# Patient Record
Sex: Female | Born: 1965 | Race: White | Hispanic: No | Marital: Married | Smoking: Former smoker
Health system: Southern US, Community
[De-identification: ages and names within clinical notes are randomized; demographics above are authoritative.]

## PROBLEM LIST (undated history)

## (undated) DIAGNOSIS — K219 Gastro-esophageal reflux disease without esophagitis: Secondary | ICD-10-CM

## (undated) DIAGNOSIS — I10 Essential (primary) hypertension: Secondary | ICD-10-CM

## (undated) HISTORY — PX: TUBAL LIGATION: SHX77

## (undated) HISTORY — DX: Essential (primary) hypertension: I10

---

## 1998-08-12 ENCOUNTER — Emergency Department (HOSPITAL_COMMUNITY): Admission: EM | Admit: 1998-08-12 | Discharge: 1998-08-12 | Payer: Self-pay | Admitting: Emergency Medicine

## 1998-08-12 ENCOUNTER — Encounter: Payer: Self-pay | Admitting: Emergency Medicine

## 1998-08-12 ENCOUNTER — Ambulatory Visit (HOSPITAL_BASED_OUTPATIENT_CLINIC_OR_DEPARTMENT_OTHER): Admission: RE | Admit: 1998-08-12 | Discharge: 1998-08-12 | Payer: Self-pay | Admitting: Oral Surgery

## 2005-07-31 ENCOUNTER — Emergency Department (HOSPITAL_COMMUNITY): Admission: EM | Admit: 2005-07-31 | Discharge: 2005-07-31 | Payer: Self-pay | Admitting: Emergency Medicine

## 2008-01-04 ENCOUNTER — Inpatient Hospital Stay (HOSPITAL_COMMUNITY): Admission: EM | Admit: 2008-01-04 | Discharge: 2008-01-06 | Payer: Self-pay | Admitting: *Deleted

## 2008-06-30 ENCOUNTER — Emergency Department (HOSPITAL_COMMUNITY): Admission: EM | Admit: 2008-06-30 | Discharge: 2008-07-01 | Payer: Self-pay | Admitting: Emergency Medicine

## 2009-01-28 ENCOUNTER — Emergency Department (HOSPITAL_COMMUNITY): Admission: EM | Admit: 2009-01-28 | Discharge: 2009-01-29 | Payer: Self-pay | Admitting: Emergency Medicine

## 2009-01-30 ENCOUNTER — Emergency Department (HOSPITAL_COMMUNITY): Admission: EM | Admit: 2009-01-30 | Discharge: 2009-01-30 | Payer: Self-pay | Admitting: Emergency Medicine

## 2009-05-29 ENCOUNTER — Emergency Department (HOSPITAL_COMMUNITY): Admission: EM | Admit: 2009-05-29 | Discharge: 2009-05-29 | Payer: Self-pay | Admitting: Emergency Medicine

## 2009-06-25 ENCOUNTER — Emergency Department (HOSPITAL_COMMUNITY): Admission: EM | Admit: 2009-06-25 | Discharge: 2009-06-25 | Payer: Self-pay | Admitting: Family Medicine

## 2009-11-15 ENCOUNTER — Emergency Department (HOSPITAL_COMMUNITY): Admission: EM | Admit: 2009-11-15 | Discharge: 2009-11-15 | Payer: Self-pay | Admitting: Emergency Medicine

## 2010-07-02 ENCOUNTER — Emergency Department (HOSPITAL_COMMUNITY): Admission: EM | Admit: 2010-07-02 | Discharge: 2010-07-02 | Payer: Self-pay | Admitting: Emergency Medicine

## 2011-03-19 ENCOUNTER — Emergency Department (HOSPITAL_COMMUNITY)
Admission: EM | Admit: 2011-03-19 | Discharge: 2011-03-19 | Disposition: A | Discharge status: Home / Self Care | Payer: Self-pay | Attending: Emergency Medicine | Admitting: Emergency Medicine

## 2011-03-19 DIAGNOSIS — I10 Essential (primary) hypertension: Secondary | ICD-10-CM | POA: Insufficient documentation

## 2011-03-19 DIAGNOSIS — R21 Rash and other nonspecific skin eruption: Secondary | ICD-10-CM | POA: Insufficient documentation

## 2011-03-26 LAB — COMPREHENSIVE METABOLIC PANEL
AST: 20 U/L (ref 0–37)
CO2: 24 mEq/L (ref 19–32)
Calcium: 9.1 mg/dL (ref 8.4–10.5)
Creatinine, Ser: 0.79 mg/dL (ref 0.4–1.2)
GFR calc Af Amer: >60 mL/min (ref >60)
GFR calc non Af Amer: >60 mL/min (ref >60)
Total Protein: 6.4 g/dL (ref 6.0–8.3)

## 2011-03-26 LAB — POCT CARDIAC MARKERS
CKMB, poc: <1 ng/mL — ABNORMAL LOW (ref 1.0–8.0)
Myoglobin, poc: 49.4 ng/mL (ref 12–200)
Troponin i, poc: <0.05 ng/mL (ref 0.00–0.09)

## 2011-03-26 LAB — DIFFERENTIAL
Eosinophils Relative: 6 % — ABNORMAL HIGH (ref 0–5)
Lymphocytes Relative: 36 % (ref 12–46)
Lymphs Abs: 2.2 10*3/uL (ref 0.7–4.0)
Monocytes Relative: 7 % (ref 3–12)
Neutrophils Relative %: 50 % (ref 43–77)

## 2011-03-26 LAB — URINALYSIS, ROUTINE W REFLEX MICROSCOPIC
Bilirubin Urine: NEGATIVE
Leukocytes, UA: NEGATIVE
Nitrite: NEGATIVE
Specific Gravity, Urine: 1.009 (ref 1.005–1.030)
Urobilinogen, UA: 0.2 mg/dL (ref 0.0–1.0)
pH: 6 (ref 5.0–8.0)

## 2011-03-26 LAB — CBC
MCHC: 33.5 g/dL (ref 30.0–36.0)
MCV: 91.1 fL (ref 78.0–100.0)
RBC: 4.57 MIL/uL (ref 3.87–5.11)
RDW: 12.7 % (ref 11.5–15.5)

## 2011-03-26 LAB — URINE MICROSCOPIC-ADD ON

## 2011-03-30 ENCOUNTER — Emergency Department (HOSPITAL_COMMUNITY)
Admission: EM | Admit: 2011-03-30 | Discharge: 2011-03-30 | Disposition: A | Discharge status: Home / Self Care | Payer: Self-pay | Attending: Emergency Medicine | Admitting: Emergency Medicine

## 2011-03-30 ENCOUNTER — Emergency Department (HOSPITAL_COMMUNITY): Payer: Self-pay

## 2011-03-30 DIAGNOSIS — R109 Unspecified abdominal pain: Secondary | ICD-10-CM | POA: Insufficient documentation

## 2011-03-30 DIAGNOSIS — E669 Obesity, unspecified: Secondary | ICD-10-CM | POA: Insufficient documentation

## 2011-03-30 DIAGNOSIS — J449 Chronic obstructive pulmonary disease, unspecified: Secondary | ICD-10-CM | POA: Insufficient documentation

## 2011-03-30 DIAGNOSIS — R112 Nausea with vomiting, unspecified: Secondary | ICD-10-CM | POA: Insufficient documentation

## 2011-03-30 DIAGNOSIS — K219 Gastro-esophageal reflux disease without esophagitis: Secondary | ICD-10-CM | POA: Insufficient documentation

## 2011-03-30 DIAGNOSIS — J4489 Other specified chronic obstructive pulmonary disease: Secondary | ICD-10-CM | POA: Insufficient documentation

## 2011-03-30 LAB — CBC
Hemoglobin: 15.7 g/dL — ABNORMAL HIGH (ref 12.0–15.0)
MCHC: 33.7 g/dL (ref 30.0–36.0)
RBC: 5.26 MIL/uL — ABNORMAL HIGH (ref 3.87–5.11)
WBC: 24.1 10*3/uL — ABNORMAL HIGH (ref 4.0–10.5)

## 2011-03-30 LAB — DIFFERENTIAL
Basophils Absolute: 0 10*3/uL (ref 0.0–0.1)
Basophils Relative: 0 % (ref 0–1)
Neutro Abs: 20.7 10*3/uL — ABNORMAL HIGH (ref 1.7–7.7)
Neutrophils Relative %: 86 % — ABNORMAL HIGH (ref 43–77)

## 2011-03-30 LAB — URINALYSIS, ROUTINE W REFLEX MICROSCOPIC
Glucose, UA: NEGATIVE mg/dL
Protein, ur: NEGATIVE mg/dL

## 2011-03-30 LAB — COMPREHENSIVE METABOLIC PANEL
ALT: 14 U/L (ref 0–35)
AST: 16 U/L (ref 0–37)
CO2: 25 mEq/L (ref 19–32)
Calcium: 9.3 mg/dL (ref 8.4–10.5)
Chloride: 101 mEq/L (ref 96–112)
GFR calc Af Amer: >60 mL/min (ref >60)
GFR calc non Af Amer: 54 mL/min — ABNORMAL LOW (ref >60)
Potassium: 4.1 mEq/L (ref 3.5–5.1)
Sodium: 137 mEq/L (ref 135–145)

## 2011-03-30 LAB — POCT I-STAT, CHEM 8
Hemoglobin: 17.7 g/dL — ABNORMAL HIGH (ref 12.0–15.0)
Potassium: 4.1 mEq/L (ref 3.5–5.1)
Sodium: 139 mEq/L (ref 135–145)
TCO2: 26 mmol/L (ref 0–100)

## 2011-03-30 LAB — URINE MICROSCOPIC-ADD ON

## 2011-03-30 MED ORDER — IOHEXOL 300 MG/ML  SOLN
130.0000 mL | Freq: Once | INTRAMUSCULAR | Status: AC | PRN
  Administered 2011-03-30: 130 mL via INTRAVENOUS

## 2011-04-03 LAB — DIFFERENTIAL
Basophils Absolute: 0 10*3/uL (ref 0.0–0.1)
Lymphocytes Relative: 7 % — ABNORMAL LOW (ref 12–46)
Neutro Abs: 8.3 10*3/uL — ABNORMAL HIGH (ref 1.7–7.7)
Neutrophils Relative %: 86 % — ABNORMAL HIGH (ref 43–77)

## 2011-04-03 LAB — URINE CULTURE

## 2011-04-03 LAB — POCT I-STAT, CHEM 8
BUN: 6 mg/dL (ref 6–23)
Chloride: 104 mEq/L (ref 96–112)
Potassium: 3.7 mEq/L (ref 3.5–5.1)
Sodium: 137 mEq/L (ref 135–145)

## 2011-04-03 LAB — URINALYSIS, ROUTINE W REFLEX MICROSCOPIC
Glucose, UA: NEGATIVE mg/dL
Nitrite: POSITIVE — AB
pH: 7 (ref 5.0–8.0)

## 2011-04-03 LAB — CBC
Hemoglobin: 12.8 g/dL (ref 12.0–15.0)
Platelets: 189 10*3/uL (ref 150–400)
RDW: 12.4 % (ref 11.5–15.5)

## 2011-04-03 LAB — URINE MICROSCOPIC-ADD ON

## 2011-05-01 NOTE — H&P (Signed)
NAMEJACQUILYN, [name] NO.:  [id number]   MEDICAL RECORD NO.:  [id number]          PATIENT TYPE:  INP   LOCATION:  1411                         FACILITY:  [facility]   PHYSICIAN:  Lucita Ferrara, MD         DATE OF BIRTH:  July 21, 1966   DATE OF ADMISSION:  01/04/2008  DATE OF DISCHARGE:                              HISTORY & PHYSICAL   The patient is a 45 year old obese Caucasian female who presents with  cough and shortness of breath that has been going on for 1 week.  It is  accompanied by some pleuritis and pleuritic-type of chest pain, clear  sputum, no fevers or chills.  The patient apparently has tried some  albuterol and steroid treatments at home without any relief.  At one  point she has been told that she might have COPD.  She is a chronic  smoker, smokes about one pack per day.  She had some associated  subjective fever.  Otherwise, review of systems negative.  She denies  any orthopnea, paroxysmal nocturnal dyspnea, lower extremity edema.  She  is morbidly-obese.  She denies daytime somnolence or snoring at night.  She denies any sinusitis, sinus pain.  Positive wheezing.  She denies  any nausea, vomiting, diarrhea.  She denies any urinary frequency,  urgency or burning.  She denies any changes in neurological function or  focal neurological signs.   PAST MEDICAL HISTORY:  Significant for being told that she has asthma.  Otherwise, no other past medical problems.   HOME MEDICATIONS:  Include albuterol metered dose inhaler two puffs as  needed.   ALLERGIES:  No known drug allergies.   PHYSICAL EXAMINATION:  The patient is a morbidly-obese female in no  acute distress.  Temperature 97, blood pressure 146/95, pulse 94,  respirations 20, pulse oximetry 97% on room air.  HEENT:  Normocephalic, atraumatic.  Sclerae anicteric.  Neck supple.  No  JVD, no carotid bruits.  PERRLA.  Extraocular muscles intact.  CARDIOVASCULAR:  S1, S2, regular rate and rhythm.  No  murmurs, rubs,  clicks.  ABDOMEN:  Soft, nontender, nondistended, positive bowel sounds.  LUNGS:  Positive bilateral expiratory wheezes.  No rhonchi or rales.  EXTREMITIES:  No clubbing, cyanosis, or edema.  NEUROLOGIC:  The patient is alert and oriented x3.  Cranial nerves II-  XII grossly intact.   LABORATORY DATA:  Urine pregnancy test is negative.  Basic metabolic  panel:  Sodium 139, potassium 2.9, chloride 106, CO2 25, glucose 123,  BUN 7, creatinine 0.75.  ABGs on room air show pH 7.411, pCO2 36.1, pO2  of 58.9, bicarb 22.7.  CBC:  White count 11.3, hemoglobin 12.8,  hematocrit 37.7, platelets 250.  Chest x-ray shows no acute infiltrate,  bilateral perihilar/peribronchial cuffing.   ASSESSMENT AND PLAN:  The patient is a 45 year old morbidly-obese female  with asthmatic bronchitis picture, rule out pneumonia or pneumonic  process, unlikely.  Will go ahead and start nebulizer treatments q.2h.  and q.4h., Solu-Medrol 80 mg IV q.6h., Zithromax for empiric treatment.  Will go ahead and discharge the  patient on tapering dose prednisone in 1-  2 days.  Smoking cessation.  Prophylaxis with Lovenox and Protonix.      Lucita Ferrara, MD  Electronically Signed     RR/MEDQ  D:  01/04/2008  T:  01/04/2008  Job:  [id number]

## 2011-09-06 LAB — DIFFERENTIAL
Lymphocytes Relative: 16
Monocytes Absolute: 0.5
Monocytes Relative: 4
Neutro Abs: 8.2 — ABNORMAL HIGH
Neutrophils Relative %: 73

## 2011-09-06 LAB — PHOSPHORUS: Phosphorus: 2.7

## 2011-09-06 LAB — CBC
HCT: 34.5 — ABNORMAL LOW
Hemoglobin: 12
Hemoglobin: 12.8
MCV: 93.6
RBC: 3.68 — ABNORMAL LOW
RBC: 4.01
WBC: 11.3 — ABNORMAL HIGH
WBC: 17 — ABNORMAL HIGH

## 2011-09-06 LAB — BASIC METABOLIC PANEL
BUN: 7
Calcium: 8.8
Chloride: 109
Creatinine, Ser: 0.75
GFR calc Af Amer: >60
GFR calc non Af Amer: >60
GFR calc non Af Amer: >60
Potassium: 3.8
Sodium: 139
Sodium: 139

## 2011-09-06 LAB — COMPREHENSIVE METABOLIC PANEL
Albumin: 3.6
Alkaline Phosphatase: 60
BUN: 7
Calcium: 8.7
Glucose, Bld: 144 — ABNORMAL HIGH
Potassium: 3.7
Sodium: 139
Total Protein: 6

## 2011-09-06 LAB — BLOOD GAS, ARTERIAL
Drawn by: 232811
O2 Saturation: 90.7
pCO2 arterial: 36.1
pO2, Arterial: 58.9 — ABNORMAL LOW

## 2011-09-06 LAB — MAGNESIUM: Magnesium: 1.8

## 2011-09-06 LAB — POCT PREGNANCY, URINE: Operator id: 173591

## 2011-09-14 LAB — POCT I-STAT, CHEM 8
BUN: 10
Calcium, Ion: 1.18
Hemoglobin: 13.3
Sodium: 139
TCO2: 22

## 2011-09-14 LAB — CBC
HCT: 37.8
Hemoglobin: 13.2
MCHC: 35
MCV: 92.3
Platelets: 241
RDW: 12.9

## 2011-09-14 LAB — DIFFERENTIAL
Basophils Absolute: 0.1
Eosinophils Absolute: 0.2
Eosinophils Relative: 3
Lymphocytes Relative: 32
Monocytes Absolute: 0.6

## 2011-12-21 ENCOUNTER — Ambulatory Visit (INDEPENDENT_AMBULATORY_CARE_PROVIDER_SITE_OTHER): Payer: Self-pay | Admitting: Internal Medicine

## 2011-12-21 ENCOUNTER — Encounter: Payer: Self-pay | Admitting: Internal Medicine

## 2011-12-21 VITALS — BP 145/86 | HR 90 | Temp 96.9°F | Ht 62.0 in | Wt 227.2 lb

## 2011-12-21 DIAGNOSIS — J4489 Other specified chronic obstructive pulmonary disease: Secondary | ICD-10-CM

## 2011-12-21 DIAGNOSIS — N921 Excessive and frequent menstruation with irregular cycle: Secondary | ICD-10-CM | POA: Insufficient documentation

## 2011-12-21 DIAGNOSIS — E669 Obesity, unspecified: Secondary | ICD-10-CM

## 2011-12-21 DIAGNOSIS — K219 Gastro-esophageal reflux disease without esophagitis: Secondary | ICD-10-CM

## 2011-12-21 DIAGNOSIS — I10 Essential (primary) hypertension: Secondary | ICD-10-CM

## 2011-12-21 DIAGNOSIS — N920 Excessive and frequent menstruation with regular cycle: Secondary | ICD-10-CM

## 2011-12-21 DIAGNOSIS — Z1211 Encounter for screening for malignant neoplasm of colon: Secondary | ICD-10-CM | POA: Insufficient documentation

## 2011-12-21 DIAGNOSIS — Z23 Encounter for immunization: Secondary | ICD-10-CM

## 2011-12-21 DIAGNOSIS — J449 Chronic obstructive pulmonary disease, unspecified: Secondary | ICD-10-CM

## 2011-12-21 DIAGNOSIS — Z0000 Encounter for general adult medical examination without abnormal findings: Secondary | ICD-10-CM

## 2011-12-21 LAB — HEMOGLOBIN A1C: Hgb A1c MFr Bld: 5.4 % (ref <5.7)

## 2011-12-21 LAB — LIPID PANEL
HDL: 36 mg/dL — ABNORMAL LOW (ref >39)
LDL Cholesterol: 120 mg/dL — ABNORMAL HIGH (ref 0–99)
Total CHOL/HDL Ratio: 6.4 Ratio

## 2011-12-21 MED ORDER — LISINOPRIL 10 MG PO TABS: 10.0000 mg | ORAL_TABLET | Freq: Every day | ORAL | Status: DC

## 2011-12-21 MED ORDER — ALBUTEROL SULFATE HFA 108 (90 BASE) MCG/ACT IN AERS: 2.0000 | INHALATION_SPRAY | Freq: Four times a day (QID) | RESPIRATORY_TRACT | Status: DC | PRN

## 2011-12-21 MED ORDER — NORGESTIMATE-ETH ESTRADIOL 0.25-35 MG-MCG PO TABS: 1.0000 | ORAL_TABLET | Freq: Every day | ORAL | Status: DC

## 2011-12-21 NOTE — Assessment & Plan Note (Signed)
This is the patient's first visit to the clinic, and we will have her see Rudell Cobb for counseling with insurance. We will draw lipid panel, A1c, and we performed a Pap smear today. The patient did have some spotting, so we hope that is a satisfactory sample. The patient also received a flu shot. We will see her back in a couple of months to followup on her menorrhagia and to go over her lab results and Pap smear.

## 2011-12-21 NOTE — Assessment & Plan Note (Addendum)
Patient describes periods lasting 7-12 days, with 2-4 of those days being very heavy with clots and going through pads and as little as 15 minutes. She describes regular cycles, she is post tubal ligation. We will put the patient on OCP (sprintec), given that she has no history of clots, no migraines, and is a nonsmoker. We will see her back in 2 months to reassess her menorrhagia and to discuss the results of her Pap smear.

## 2011-12-21 NOTE — Assessment & Plan Note (Signed)
Patient's initial blood pressure was in the 140s, but upon recheck was in the low 130s. I prescribed her normal dose of lisinopril 10 mg daily, and she can be followed at her next visit.

## 2011-12-21 NOTE — Assessment & Plan Note (Signed)
We briefly discussed weight loss, and I told her that in the future we have a dietary counselor that she could see, but this issue was not broadly addressed during this visit

## 2011-12-21 NOTE — Progress Notes (Signed)
Subjective:     Patient ID: [name], female   DOB: 05/31/1966, 46 y.o.   MRN: [id number]  HPI Patient is a very pleasant 46 year old woman had establish care. She has hypertension, was told she had COPD, and is obese. She's never had a PMD.  She has no specific complaints today, but does describe menorrhagia. She has regular cycles, with periods lasting 7-12 days, with 2 of the 4 days being very heavy with clots and going through a pad up to every 15 minutes. She's had a tubal ligation.  She has 5 children, lives with her 2 teenagers and her husband. She does not work. She used to work Education officer, [employer]. She does not use drugs or drink alcohol, she is a former smoker. She quit in 2009, and has a 30-40-pack-year history. She is admitted to the hospital in 2009 for an acute bronchitis and told she might have COPD. She has an albuterol inhaler that she uses when necessary, typically when she has a cold, but rarely other than that.  Review of Systems No chest pain, palpitations    Objective:   Physical Exam Filed Vitals:   12/21/11 1039  BP: 145/86  Pulse: 90  Temp: 96.9 F (36.1 C)   VItal signs reviewed and stable. GEN: No apparent distress.  Alert and oriented x 3.  Pleasant, conversant, and cooperative to exam. HEENT: NCAT.  Neck is supple without palpable masses or lymphadenopathy.  EOMI.  PERRLA.  Sclerae anicteric.  Conjunctivae noninjected. MMM.  Oropharynx is without erythema, exudates, or other abnormal lesions. RESP:  Lungs are clear to ascultation bilaterally with good air movement.  No wheezes, ronchi, or rubs. CARDIOVASCULAR: regular rate, normal rhythm.  Clear S1, S2, no murmurs, gallops, or rubs. ABDOMEN: soft, non-tender, non-distended.  Bowels sounds present in all quadrants and normoactive.  No palpable masses. EXT: warm and dry.  Peripheral pulses equal, intact, and +2 globally.  No clubbing or cyanosis.  No edema in b/l lower extremeties SKIN: warm and dry with normal  turgor.  No rashes or abnormal lesions observed. NEURO: CN II-XII grossly intact.  Muscle strength +5/5 in bilateral upper and lower extremities.  Sensation is grossly intact.  No focal deficit.     Assessment:        Plan:

## 2011-12-21 NOTE — Assessment & Plan Note (Signed)
Patient was told that she had COPD in 2009, no PFTs on file, does have a 30-40 pack year smoking history, with quit date in 2009. She describes occasional albuterol use, especially when sick. I think she may have asthma. If she can get plugged into the orange card or some other insurance, we could pursue outpatient PFTs to establish a baseline. If there is true concern for COPD, then she may need testing for alpha-1 antitrypsin, so this issue should be continued to get worked up as she gets plugged into our system.

## 2011-12-21 NOTE — Progress Notes (Signed)
Patient seen and examined. I performed a pap smear as patient preferred that a female MD do the procedure.   EA:[id number] appearing external genitalia. Normal appearing vaginal vault with small amount of bloody discharge coming from the cervical os. Endo- and exo-cervical pap smears obtained with the broom causing more bloody discharge. No bimanual exam done because of bleeding.  I agree with the rest of assessment and plan as per Dr. Abner Greenspan.

## 2012-05-06 ENCOUNTER — Emergency Department (HOSPITAL_COMMUNITY): Payer: Self-pay

## 2012-05-06 ENCOUNTER — Emergency Department (HOSPITAL_COMMUNITY)
Admission: EM | Admit: 2012-05-06 | Discharge: 2012-05-06 | Disposition: A | Discharge status: Home / Self Care | Payer: Self-pay | Attending: Emergency Medicine | Admitting: Emergency Medicine

## 2012-05-06 ENCOUNTER — Encounter (HOSPITAL_COMMUNITY): Payer: Self-pay

## 2012-05-06 DIAGNOSIS — Z79899 Other long term (current) drug therapy: Secondary | ICD-10-CM | POA: Insufficient documentation

## 2012-05-06 DIAGNOSIS — J449 Chronic obstructive pulmonary disease, unspecified: Secondary | ICD-10-CM | POA: Insufficient documentation

## 2012-05-06 DIAGNOSIS — J4489 Other specified chronic obstructive pulmonary disease: Secondary | ICD-10-CM | POA: Insufficient documentation

## 2012-05-06 DIAGNOSIS — I10 Essential (primary) hypertension: Secondary | ICD-10-CM | POA: Insufficient documentation

## 2012-05-06 LAB — BASIC METABOLIC PANEL
BUN: 7 mg/dL (ref 6–23)
Chloride: 102 mEq/L (ref 96–112)
Creatinine, Ser: 0.62 mg/dL (ref 0.50–1.10)
GFR calc Af Amer: >90 mL/min (ref >90)
GFR calc non Af Amer: >90 mL/min (ref >90)
Glucose, Bld: 99 mg/dL (ref 70–99)

## 2012-05-06 LAB — CBC
HCT: 38.8 % (ref 36.0–46.0)
Hemoglobin: 12.9 g/dL (ref 12.0–15.0)
MCH: 29.9 pg (ref 26.0–34.0)
MCHC: 33.2 g/dL (ref 30.0–36.0)
RBC: 4.32 MIL/uL (ref 3.87–5.11)

## 2012-05-06 LAB — DIFFERENTIAL
Basophils Relative: 1 % (ref 0–1)
Eosinophils Absolute: 1.1 10*3/uL — ABNORMAL HIGH (ref 0.0–0.7)
Lymphs Abs: 2.2 10*3/uL (ref 0.7–4.0)
Monocytes Absolute: 0.4 10*3/uL (ref 0.1–1.0)
Monocytes Relative: 5 % (ref 3–12)

## 2012-05-06 MED ORDER — MAGNESIUM SULFATE 40 MG/ML IJ SOLN
2.0000 g | Freq: Once | INTRAMUSCULAR | Status: AC
  Administered 2012-05-06: 2 g via INTRAVENOUS
  Filled 2012-05-06 (×2): qty 50

## 2012-05-06 MED ORDER — PANTOPRAZOLE SODIUM 40 MG PO TBEC: 40.0000 mg | DELAYED_RELEASE_TABLET | Freq: Every day | ORAL | Status: DC

## 2012-05-06 MED ORDER — ALBUTEROL SULFATE (5 MG/ML) 0.5% IN NEBU
5.0000 mg | INHALATION_SOLUTION | Freq: Once | RESPIRATORY_TRACT | Status: AC
  Administered 2012-05-06: 5 mg via RESPIRATORY_TRACT
  Filled 2012-05-06: qty 1

## 2012-05-06 MED ORDER — PREDNISONE 50 MG PO TABS: ORAL_TABLET | ORAL | Status: DC

## 2012-05-06 MED ORDER — PREDNISONE 20 MG PO TABS
60.0000 mg | ORAL_TABLET | Freq: Once | ORAL | Status: AC
  Administered 2012-05-06: 60 mg via ORAL
  Filled 2012-05-06: qty 3

## 2012-05-06 MED ORDER — SODIUM CHLORIDE 0.9 % IV BOLUS (SEPSIS)
1000.0000 mL | Freq: Once | INTRAVENOUS | Status: AC
  Administered 2012-05-06: 1000 mL via INTRAVENOUS

## 2012-05-06 MED ORDER — ALBUTEROL SULFATE HFA 108 (90 BASE) MCG/ACT IN AERS: 1.0000 | INHALATION_SPRAY | Freq: Four times a day (QID) | RESPIRATORY_TRACT | Status: DC | PRN

## 2012-05-06 MED ORDER — IPRATROPIUM BROMIDE 0.02 % IN SOLN
0.5000 mg | Freq: Once | RESPIRATORY_TRACT | Status: AC
  Administered 2012-05-06: 0.5 mg via RESPIRATORY_TRACT
  Filled 2012-05-06: qty 2.5

## 2012-05-06 MED ORDER — ALBUTEROL SULFATE (2.5 MG/3ML) 0.083% IN NEBU: 2.5000 mg | INHALATION_SOLUTION | Freq: Four times a day (QID) | RESPIRATORY_TRACT | Status: DC | PRN

## 2012-05-06 NOTE — ED Provider Notes (Signed)
ECG shows normal sinus rhythm with a rate of 88, no ectopy. Normal axis. Normal P wave. Normal QRS. Normal intervals. Normal ST and T waves. Impression: normal ECG. When compared with ECG of 05/30/1999 and, no significant changes are seen   Dione Booze, MD 05/06/12 [phone]

## 2012-05-06 NOTE — ED Notes (Signed)
Patient presents reporting progressive shortness of breath x 1 month without relief from albuterol treatments or inhalers.  Patient also reporting cough and low grade fever.  Denies chest pain, n/v.

## 2012-05-06 NOTE — ED Notes (Signed)
Pt c/o wheezing and sob x1 month, pt reports using her at home Ventolin inhaler, Albuterol 2.5 mg neb tx, delsym, and Nyquil w/no relief. Audible wheezing noted while talking w/pt. Pt reports last using her neb tx 0730 this am. Pt also reports dry cough and bilateral rib pain d/t coughing, unable to lay down and sleep d/t coughing and sob

## 2012-05-06 NOTE — ED Notes (Signed)
Patient transported to X-ray 

## 2012-05-06 NOTE — ED Provider Notes (Signed)
Medical screening examination/treatment/procedure(s) were performed by non-physician practitioner and as supervising physician I was immediately available for consultation/collaboration.   Paris Hohn, MD 05/06/12 1612 

## 2012-05-06 NOTE — ED Notes (Signed)
MD at bedside. 

## 2012-05-06 NOTE — Discharge Instructions (Signed)
Continue your as needed albuterol treatments and take prednisone for complete 5 days. It is VERY important to follow up closely with your primary care provider in the near future to further discuss your asthma management however return to emergency department any time for emergent changing or worsening symptoms.  Asthma, Adult Asthma is a disease of the lungs and can make it hard to breathe. Asthma cannot be cured, but medicine can help control it. Asthma may be started (triggered) by:  Pollen.   Dust.   Animal skin flakes (dander).   Molds.   Foods.   Respiratory infections (colds, flu).   Smoke.   Exercise.   Stress.   Other things that cause allergic reactions or allergies (allergens).  HOME CARE   Talk to your doctor about how to manage your attacks at home. This may include:   Using a tool called a peak flow meter.   Having medicine ready to stop the attack.   Take all medicine as told by your doctor.   Wash bed sheets and blankets every week in hot water and put them in the dryer.   Drink enough fluids to keep your pee (urine) clear or pale yellow.   Always be ready to get emergency help. Write down the phone number for your doctor. Keep it where you can easily find it.   Talk about exercise routines with your doctor.   If animal dander is causing your asthma, you may need to find a new home for your pet(s).  GET HELP RIGHT AWAY IF:   You have muscle aches.   You cough more.   You have chest pain.   You have thick spit (sputum) that changes to yellow, green, gray, or bloody.   Medicine does not stop your wheezing.   You have problems breathing.   You have a fever.   Your medicine causes:   A rash.   Itching.   Puffiness (swelling).   Breathing problems.  MAKE SURE YOU:   Understand these instructions.   Will watch your condition.   Will get help right away if you are not doing well or get worse.  Document Released: 05/21/2008 Document  Revised: 11/22/2011 Document Reviewed: 10/13/2008 [facility] Patient Information 2012 [place], [place].

## 2012-05-06 NOTE — ED Provider Notes (Signed)
History     CSN: [id number]  Arrival date & time 05/06/12  4098   First MD Initiated Contact with Patient 05/06/12 [phone]      Chief Complaint  Patient presents with  . Shortness of Breath    (Consider location/radiation/quality/duration/timing/severity/associated sxs/prior treatment) Patient is a 46 y.o. female presenting with shortness of breath.  Shortness of Breath  Associated symptoms include shortness of breath.   Patient presents to emergency department complaining of a one month history of waxing and waning shortness of breath, cough, and wheezing stating that she has history of asthma for which she takes at-home inhaler and as needed nebulized albuterol however states that she has run over her out albuterol inhaler over the last month. Patient states she had mild temporary improvement with her albuterol inhaler use but has since run out. Patient states that she has coughing but denies productive cough. Patient states that her bilateral ribs get "sore because of all the coughing." She denies fevers, chills, chest pain, abdominal pain, nausea, vomiting, lower extremity pain or swelling. Patient states that she was taking a daily allergy medication, Claritin, but stopped that after about a week of use for no particular reason. Denies aggravating factors.  Past Medical History  Diagnosis Date  . COPD (chronic obstructive pulmonary disease)   . Hypertension     Past Surgical History  Procedure Date  . Tubal ligation     Family History  Problem Relation Age of Onset  . Hypertension Mother   . Stroke Mother   . Hypertension Father   . Heart disease Father   . COPD Sister   . Heart disease Brother   . Hypertension Brother   . Cancer Maternal Aunt   . Cancer Paternal Aunt   . Cancer Maternal Grandmother   . Heart disease Maternal Grandfather   . Hypertension Maternal Grandfather   . Heart disease Paternal Grandmother   . Diabetes Paternal Grandmother   . Heart disease  Paternal Grandfather   . Hypertension Paternal Grandfather     History  Substance Use Topics  . Smoking status: Former Smoker -- 1.0 packs/day    Quit date: 01/05/2008  . Smokeless tobacco: Never Used  . Alcohol Use: No    OB History    Grav Para Term Preterm Abortions TAB SAB Ect Mult Living                  Review of Systems  Respiratory: Positive for shortness of breath.   All other systems reviewed and are negative.    Allergies  Sulfa drugs cross reactors  Home Medications   Current Outpatient Rx  Name Route Sig Dispense Refill  . ALBUTEROL SULFATE HFA 108 (90 BASE) MCG/ACT IN AERS Inhalation Inhale 2 puffs into the lungs every 6 (six) hours as needed. For SOB    . ALBUTEROL SULFATE (2.5 MG/3ML) 0.083% IN NEBU Nebulization Take 2.5 mg by nebulization every 6 (six) hours as needed.    Marland Kitchen DEXTROMETHORPHAN POLISTIREX ER 30 MG/5ML PO LQCR Oral Take 60 mg by mouth as needed. For cough    . DIPHENHYDRAMINE HCL 25 MG PO CAPS Oral Take 50-75 mg by mouth every 6 (six) hours as needed. For sleep    . DM-DOXYLAMINE-ACETAMINOPHEN 15-6.25-325 MG PO CAPS Oral Take 2 tablets by mouth. For cough    . LISINOPRIL 10 MG PO TABS Oral Take 1 tablet (10 mg total) by mouth daily. 90 tablet 2  . NORGESTIMATE-ETH ESTRADIOL 0.25-35 MG-MCG PO TABS Oral  Take 1 tablet by mouth daily. 1 Package 11    Please dispense 3 month supply if pt wants it  . OMEPRAZOLE 10 MG PO CPDR Oral Take 10 mg by mouth daily.        BP 157/99  Pulse 112  Temp(Src) 98.1 F (36.7 C) (Oral)  Resp 22  SpO2 97%  LMP 05/04/2012  Physical Exam  Nursing note and vitals reviewed. Constitutional: She is oriented to person, place, and time. She appears well-developed and well-nourished. No distress.  HENT:  Head: Normocephalic and atraumatic.  Eyes: Conjunctivae are normal.  Neck: Normal range of motion. Neck supple.  Cardiovascular: Normal rate, regular rhythm, normal heart sounds and intact distal pulses.  Exam  reveals no gallop and no friction rub.   No murmur heard. Pulmonary/Chest: Effort normal. No respiratory distress. She has wheezes. She has no rales. She exhibits no tenderness.       Good air movement bilaterally in all lung fields with faint expiratory wheezing throughout lung fields.  Abdominal: Soft. Bowel sounds are normal. She exhibits no distension and no mass. There is no tenderness. There is no rebound and no guarding.  Musculoskeletal: Normal range of motion. She exhibits no edema and no tenderness.  Neurological: She is alert and oriented to person, place, and time.  Skin: Skin is warm and dry. No rash noted. She is not diaphoretic. No erythema.  Psychiatric: She has a normal mood and affect.    ED Course  Procedures (including critical care time)  Neb albuterol/atrovent, IV fluids PO prednisone and IV magnesium  Labs Reviewed  DIFFERENTIAL - Abnormal; Notable for the following:    Eosinophils Relative 14 (*)    Eosinophils Absolute 1.1 (*)    All other components within normal limits  BASIC METABOLIC PANEL - Abnormal; Notable for the following:    Potassium 3.4 (*)    All other components within normal limits  CBC  D-DIMER, QUANTITATIVE   Dg Chest 2 View  05/06/2012  *RADIOLOGY REPORT*  Clinical Data: Cough and shortness of breath for 1 month, history smoking, asthma  CHEST - 2 VIEW  Comparison: 11/15/2009  Findings: Normal heart size, mediastinal contours, and pulmonary vascularity. Mild chronic peribronchial thickening. No acute infiltrate, pleural effusion, or pneumothorax. No acute osseous findings.  IMPRESSION: Chronic peribronchial thickening which could reflect bronchitis or asthma. No acute infiltrate.  Original Report Authenticated By: Lollie Marrow, M.D.     1. Asthma       MDM  Patient states she is feeling much improved after neb tx, fluids and magnesium with resolution of wheezing. Negative Ddimer with patient denying CP. She is followed by [facility] and is  agreeable to close follow up to further manage asthma.        [place], [place] 05/06/12 1030

## 2012-06-10 ENCOUNTER — Encounter: Payer: Self-pay | Admitting: Internal Medicine

## 2012-06-10 ENCOUNTER — Ambulatory Visit (INDEPENDENT_AMBULATORY_CARE_PROVIDER_SITE_OTHER): Payer: Self-pay | Admitting: Internal Medicine

## 2012-06-10 VITALS — BP 140/100 | HR 89 | Temp 97.2°F | Ht 62.0 in | Wt 229.0 lb

## 2012-06-10 DIAGNOSIS — I129 Hypertensive chronic kidney disease with stage 1 through stage 4 chronic kidney disease, or unspecified chronic kidney disease: Secondary | ICD-10-CM

## 2012-06-10 DIAGNOSIS — J449 Chronic obstructive pulmonary disease, unspecified: Secondary | ICD-10-CM

## 2012-06-10 DIAGNOSIS — I10 Essential (primary) hypertension: Secondary | ICD-10-CM

## 2012-06-10 DIAGNOSIS — N920 Excessive and frequent menstruation with regular cycle: Secondary | ICD-10-CM

## 2012-06-10 DIAGNOSIS — Z0000 Encounter for general adult medical examination without abnormal findings: Secondary | ICD-10-CM

## 2012-06-10 MED ORDER — LISINOPRIL 20 MG PO TABS: 20.0000 mg | ORAL_TABLET | Freq: Every day | ORAL | Status: DC

## 2012-06-10 MED ORDER — LORATADINE 10 MG PO TABS: 10.0000 mg | ORAL_TABLET | Freq: Every day | ORAL | Status: DC

## 2012-06-10 NOTE — Assessment & Plan Note (Addendum)
Patient was in the emergency room for likely asthma attack last month. Did have eosinophilia. Did respond to magnesium and nebulizers. Was not admitted. Since then has been doing fine, with using albuterol only occasionally (less than once weekly). Lungs sound clear on exam. I do think she likely has asthma as opposed to COPD, despite what she was told in 2009. She has quit smoking 5 years ago. - Continue albuterol when necessary - Consider maintenance agent if her symptoms worsen, but at this time she does not need one - Continue smoking cessation

## 2012-06-10 NOTE — Progress Notes (Signed)
Subjective:     Patient ID: [name], female   DOB: 1966-04-16, 46 y.o.   MRN: [id number]  HPI Patient is a very pleasant 46 year old woman with a history of asthma, hypertension, obesity, and menorrhagia who presents for followup.  Since starting OCPs in January, the patient reports resolution of her menorrhagia. She reports periods now of roughly 6 days with 0-1 days of heavy menstruation. She feels excellent from this standpoint.  Patient was in the emergency room in May for an asthma exacerbation. Aside from that, she uses albuterol less than once weekly and notices symptoms only on occasion.  She has been taking her lisinopril as prescribed.  Review of Systems No chest pain, no shortness of breath, no abdominal pain, no dysuria    Objective:   Physical Exam GEN: NAD.  Alert and oriented x 3.  Pleasant, conversant, and cooperative to exam. RESP:  CTAB, no w/r/r CARDIOVASCULAR: RRR, S1, S2, no m/r/g ABDOMEN: soft, NT/ND, NABS EXT: warm and dry. No edema in b/l LE     Assessment:         Plan:

## 2012-06-10 NOTE — Assessment & Plan Note (Signed)
Blood pressure still elevated slightly despite lisinopril 10 mg. BMP in ED last month was good. - Increase lisinopril to 20 mg daily

## 2012-06-10 NOTE — Assessment & Plan Note (Signed)
-   Lipids at goal off medication - Screening A1c was negative in January of 2013 - Pap smear was normal in January 2013 - No mammogram at this time, can continue to discuss with patient given that she is in the gray zone of ages 24-50

## 2012-06-10 NOTE — Assessment & Plan Note (Signed)
Menorrhagia has resolved with initiation of OCP. The patient is very pleased with his result. She does have risk factor of hypertension that is a possible contraindication to OCP. Back in January we had discussed starting the OCP with one of Dr. Sandy Salaam OB/GYN colleagues via telephone.   Nonetheless, I would like to get her referred to OB/GYN for evaluation for possible hysterectomy, which the patient is interested in. In the meantime, he think the benefits of controlling her menorrhagia still outweigh the risks, but I do not want her to be on it for an extended period of time. She had not yet submitted her paperwork for the orange card, which would facilitate referral. She is planning on submitting it seen. - Orange card - Continue OCP - GYN referral once orange card is complete

## 2012-11-16 ENCOUNTER — Encounter (HOSPITAL_COMMUNITY): Payer: Self-pay | Admitting: *Deleted

## 2012-11-16 ENCOUNTER — Emergency Department (HOSPITAL_COMMUNITY)
Admission: EM | Admit: 2012-11-16 | Discharge: 2012-11-16 | Disposition: A | Discharge status: Home / Self Care | Payer: Self-pay | Attending: Emergency Medicine | Admitting: Emergency Medicine

## 2012-11-16 DIAGNOSIS — J449 Chronic obstructive pulmonary disease, unspecified: Secondary | ICD-10-CM | POA: Insufficient documentation

## 2012-11-16 DIAGNOSIS — Z87891 Personal history of nicotine dependence: Secondary | ICD-10-CM | POA: Insufficient documentation

## 2012-11-16 DIAGNOSIS — R197 Diarrhea, unspecified: Secondary | ICD-10-CM | POA: Insufficient documentation

## 2012-11-16 DIAGNOSIS — J4489 Other specified chronic obstructive pulmonary disease: Secondary | ICD-10-CM | POA: Insufficient documentation

## 2012-11-16 DIAGNOSIS — R1110 Vomiting, unspecified: Secondary | ICD-10-CM | POA: Insufficient documentation

## 2012-11-16 DIAGNOSIS — R109 Unspecified abdominal pain: Secondary | ICD-10-CM | POA: Insufficient documentation

## 2012-11-16 DIAGNOSIS — Z79899 Other long term (current) drug therapy: Secondary | ICD-10-CM | POA: Insufficient documentation

## 2012-11-16 DIAGNOSIS — R05 Cough: Secondary | ICD-10-CM | POA: Insufficient documentation

## 2012-11-16 DIAGNOSIS — I10 Essential (primary) hypertension: Secondary | ICD-10-CM | POA: Insufficient documentation

## 2012-11-16 DIAGNOSIS — R059 Cough, unspecified: Secondary | ICD-10-CM | POA: Insufficient documentation

## 2012-11-16 LAB — URINALYSIS, ROUTINE W REFLEX MICROSCOPIC
Bilirubin Urine: NEGATIVE
Leukocytes, UA: NEGATIVE
Nitrite: NEGATIVE
Specific Gravity, Urine: 1.025 (ref 1.005–1.030)
Urobilinogen, UA: 0.2 mg/dL (ref 0.0–1.0)
pH: 5.5 (ref 5.0–8.0)

## 2012-11-16 LAB — LIPASE, BLOOD: Lipase: 17 U/L (ref 11–59)

## 2012-11-16 LAB — COMPREHENSIVE METABOLIC PANEL
Albumin: 3.8 g/dL (ref 3.5–5.2)
Alkaline Phosphatase: 59 U/L (ref 39–117)
BUN: 9 mg/dL (ref 6–23)
Calcium: 9.6 mg/dL (ref 8.4–10.5)
Creatinine, Ser: 0.74 mg/dL (ref 0.50–1.10)
GFR calc Af Amer: >90 mL/min (ref >90)
Glucose, Bld: 116 mg/dL — ABNORMAL HIGH (ref 70–99)
Potassium: 3.9 mEq/L (ref 3.5–5.1)
Total Protein: 7.6 g/dL (ref 6.0–8.3)

## 2012-11-16 LAB — CBC WITH DIFFERENTIAL/PLATELET
Basophils Relative: 0 % (ref 0–1)
Eosinophils Absolute: 0.2 10*3/uL (ref 0.0–0.7)
Eosinophils Relative: 1 % (ref 0–5)
Hemoglobin: 14.7 g/dL (ref 12.0–15.0)
Lymphs Abs: 1.7 10*3/uL (ref 0.7–4.0)
MCH: 30.8 pg (ref 26.0–34.0)
MCHC: 34.4 g/dL (ref 30.0–36.0)
MCV: 89.3 fL (ref 78.0–100.0)
Monocytes Relative: 4 % (ref 3–12)
Neutrophils Relative %: 84 % — ABNORMAL HIGH (ref 43–77)
RBC: 4.78 MIL/uL (ref 3.87–5.11)

## 2012-11-16 LAB — URINE MICROSCOPIC-ADD ON

## 2012-11-16 MED ORDER — HYDROMORPHONE HCL PF 1 MG/ML IJ SOLN
1.0000 mg | Freq: Once | INTRAMUSCULAR | Status: AC
  Administered 2012-11-16: 1 mg via INTRAVENOUS
  Filled 2012-11-16: qty 1

## 2012-11-16 MED ORDER — PROMETHAZINE HCL 25 MG PO TABS: 25.0000 mg | ORAL_TABLET | Freq: Four times a day (QID) | ORAL | Status: DC | PRN

## 2012-11-16 MED ORDER — ONDANSETRON HCL 4 MG/2ML IJ SOLN
4.0000 mg | Freq: Once | INTRAMUSCULAR | Status: AC
  Administered 2012-11-16: 4 mg via INTRAVENOUS
  Filled 2012-11-16: qty 2

## 2012-11-16 MED ORDER — SODIUM CHLORIDE 0.9 % IV SOLN
1000.0000 mL | Freq: Once | INTRAVENOUS | Status: AC
  Administered 2012-11-16: 1000 mL via INTRAVENOUS

## 2012-11-16 MED ORDER — SODIUM CHLORIDE 0.9 % IV SOLN
1000.0000 mL | INTRAVENOUS | Status: DC
  Administered 2012-11-16: 1000 mL via INTRAVENOUS

## 2012-11-16 MED ORDER — ONDANSETRON HCL 4 MG/2ML IJ SOLN: 4.0000 mg | Freq: Once | INTRAMUSCULAR | Status: DC

## 2012-11-16 NOTE — ED Notes (Signed)
Reports n/v/d since 0300 this am. Having abd cramping.

## 2012-11-16 NOTE — ED Provider Notes (Signed)
History    CSN: [id number] Arrival date & time 11/16/12  0865 First MD Initiated Contact with Patient 11/16/12 [phone]    Chief Complaint  Patient presents with  . Emesis    HPI Comments: No foreign travel.  No suspect food.    Patient is a 46 y.o. female presenting with vomiting. The history is provided by the patient.  Emesis  This is a new problem. The current episode started 6 to 12 hours ago (3:30 this morning). The problem occurs more than 10 times per day. The problem has not changed since onset.The emesis has an appearance of stomach contents. There has been no fever. Associated symptoms include abdominal pain, cough and diarrhea. Pertinent negatives include no fever and no sweats.  Neldon Newport has heartburn but generally does not have abdominal problems.  The abdominal pain is mostly around the umbilicus.  Mostly feels like it is cramping and feels like her stomach is twisting.  Past Medical History  Diagnosis Date  . COPD (chronic obstructive pulmonary disease)   . Hypertension     Past Surgical History  Procedure Date  . Tubal ligation     Family History  Problem Relation Age of Onset  . Hypertension Mother   . Stroke Mother   . Hypertension Father   . Heart disease Father   . COPD Sister   . Heart disease Brother   . Hypertension Brother   . Cancer Maternal Aunt   . Cancer Paternal Aunt   . Cancer Maternal Grandmother   . Heart disease Maternal Grandfather   . Hypertension Maternal Grandfather   . Heart disease Paternal Grandmother   . Diabetes Paternal Grandmother   . Heart disease Paternal Grandfather   . Hypertension Paternal Grandfather     History  Substance Use Topics  . Smoking status: Former Smoker -- 1.0 packs/day    Quit date: 01/05/2008  . Smokeless tobacco: Never Used  . Alcohol Use: No    OB History    Grav Para Term Preterm Abortions TAB SAB Ect Mult Living                  Review of Systems  Constitutional: Negative for fever.    Respiratory: Positive for cough.   Gastrointestinal: Positive for vomiting, abdominal pain and diarrhea.  All other systems reviewed and are negative.    Allergies  Sulfa drugs cross reactors  Home Medications   Current Outpatient Rx  Name  Route  Sig  Dispense  Refill  . ALBUTEROL SULFATE HFA 108 (90 BASE) MCG/ACT IN AERS   Inhalation   Inhale 2 puffs into the lungs every 6 (six) hours as needed. For SOB         . DIPHENHYDRAMINE HCL 25 MG PO CAPS   Oral   Take 50-75 mg by mouth at bedtime as needed. For sleep         . ESOMEPRAZOLE MAGNESIUM 40 MG PO CPDR   Oral   Take 40 mg by mouth daily before breakfast.         . LISINOPRIL 20 MG PO TABS   Oral   Take 1 tablet (20 mg total) by mouth daily.   90 tablet   2   . NORGESTIMATE-ETH ESTRADIOL 0.25-35 MG-MCG PO TABS   Oral   Take 1 tablet by mouth daily.   1 Package   11     Please dispense 3 month supply if pt wants it   . ALBUTEROL SULFATE (2.5 MG/3ML)  0.083% IN NEBU   Nebulization   Take 3 mLs (2.5 mg total) by nebulization every 6 (six) hours as needed for wheezing.   75 mL   12     BP 148/95  Pulse 95  Temp 98.4 F (36.9 C) (Oral)  Resp 20  SpO2 98%  Physical Exam  Nursing note and vitals reviewed. Constitutional: She appears well-developed and well-nourished. No distress.  HENT:  Head: Normocephalic and atraumatic.  Right Ear: External ear normal.  Left Ear: External ear normal.  Eyes: Conjunctivae normal are normal. Right eye exhibits no discharge. Left eye exhibits no discharge. No scleral icterus.  Neck: Neck supple. No tracheal deviation present.  Cardiovascular: Normal rate, regular rhythm and intact distal pulses.   Pulmonary/Chest: Effort normal and breath sounds normal. No stridor. No respiratory distress. She has no wheezes. She has no rales.  Abdominal: Soft. Bowel sounds are normal. She exhibits no distension, no ascites and no mass. There is tenderness in the periumbilical area  and suprapubic area. There is no rigidity, no rebound, no guarding and no CVA tenderness. No hernia.  Musculoskeletal: She exhibits no edema and no tenderness.  Neurological: She is alert. She has normal strength. No sensory deficit. Cranial nerve deficit:  no gross defecits noted. She exhibits normal muscle tone. She displays no seizure activity. Coordination normal.  Skin: Skin is warm and dry. No rash noted.  Psychiatric: She has a normal mood and affect.    ED Course  Procedures (including critical care time)  Labs Reviewed  CBC WITH DIFFERENTIAL - Abnormal; Notable for the following:    WBC 16.7 (*)     Neutrophils Relative 84 (*)     Neutro Abs 14.1 (*)     Lymphocytes Relative 10 (*)     All other components within normal limits  COMPREHENSIVE METABOLIC PANEL - Abnormal; Notable for the following:    Glucose, Bld 116 (*)     All other components within normal limits  URINALYSIS, ROUTINE W REFLEX MICROSCOPIC - Abnormal; Notable for the following:    APPearance CLOUDY (*)     Hgb urine dipstick TRACE (*)     Ketones, ur >80 (*)     All other components within normal limits  URINE MICROSCOPIC-ADD ON - Abnormal; Notable for the following:    Squamous Epithelial / LPF MANY (*)     All other components within normal limits  LIPASE, BLOOD   No results found.   1. Vomiting and diarrhea       MDM  Pt is feeling better after treatment.  On repeat exam she has not focal ttp and denies any pain.  Suspect this may be related to a viral illness.  Doubt appendicitis, diverticulitis, colitis, pelvic etiology.  Discussed close follow up.  Return to the ED for recurrent pain, inability to keep down fluids.        Celene Kras, MD 11/16/12 1254

## 2012-11-16 NOTE — ED Notes (Signed)
Assumed care of pt. Pt reports that "she is feeling much better." 2/10 abd pain and decreased nausea. VSS. Family at bedside.

## 2012-11-17 ENCOUNTER — Encounter (HOSPITAL_COMMUNITY): Payer: Self-pay | Admitting: *Deleted

## 2012-11-17 ENCOUNTER — Encounter: Payer: Self-pay | Admitting: Internal Medicine

## 2012-11-17 ENCOUNTER — Observation Stay (HOSPITAL_COMMUNITY)
Admission: AD | Admit: 2012-11-17 | Discharge: 2012-11-20 | Disposition: A | Discharge status: Home / Self Care | Payer: Self-pay | Source: Ambulatory Visit | Attending: Internal Medicine | Admitting: Internal Medicine

## 2012-11-17 ENCOUNTER — Ambulatory Visit (INDEPENDENT_AMBULATORY_CARE_PROVIDER_SITE_OTHER): Payer: Self-pay | Admitting: Internal Medicine

## 2012-11-17 VITALS — BP 119/84 | HR 104 | Temp 97.2°F | Ht 62.0 in | Wt 232.3 lb

## 2012-11-17 DIAGNOSIS — K625 Hemorrhage of anus and rectum: Secondary | ICD-10-CM | POA: Diagnosis present

## 2012-11-17 DIAGNOSIS — D62 Acute posthemorrhagic anemia: Secondary | ICD-10-CM | POA: Insufficient documentation

## 2012-11-17 DIAGNOSIS — K219 Gastro-esophageal reflux disease without esophagitis: Secondary | ICD-10-CM | POA: Insufficient documentation

## 2012-11-17 DIAGNOSIS — K921 Melena: Secondary | ICD-10-CM | POA: Insufficient documentation

## 2012-11-17 DIAGNOSIS — K559 Vascular disorder of intestine, unspecified: Principal | ICD-10-CM | POA: Insufficient documentation

## 2012-11-17 DIAGNOSIS — R109 Unspecified abdominal pain: Secondary | ICD-10-CM

## 2012-11-17 DIAGNOSIS — I10 Essential (primary) hypertension: Secondary | ICD-10-CM | POA: Insufficient documentation

## 2012-11-17 DIAGNOSIS — R112 Nausea with vomiting, unspecified: Secondary | ICD-10-CM | POA: Insufficient documentation

## 2012-11-17 DIAGNOSIS — J4489 Other specified chronic obstructive pulmonary disease: Secondary | ICD-10-CM | POA: Insufficient documentation

## 2012-11-17 DIAGNOSIS — J449 Chronic obstructive pulmonary disease, unspecified: Secondary | ICD-10-CM

## 2012-11-17 HISTORY — DX: Gastro-esophageal reflux disease without esophagitis: K21.9

## 2012-11-17 LAB — CBC
MCH: 29.8 pg (ref 26.0–34.0)
MCHC: 33 g/dL (ref 30.0–36.0)
MCV: 90.4 fL (ref 78.0–100.0)
Platelets: 226 10*3/uL (ref 150–400)
RDW: 13.1 % (ref 11.5–15.5)

## 2012-11-17 LAB — COMPREHENSIVE METABOLIC PANEL
AST: 14 U/L (ref 0–37)
BUN: 6 mg/dL (ref 6–23)
Calcium: 8.5 mg/dL (ref 8.4–10.5)
Chloride: 104 mEq/L (ref 96–112)
Creat: 0.81 mg/dL (ref 0.50–1.10)
Total Bilirubin: 0.3 mg/dL (ref 0.3–1.2)

## 2012-11-17 LAB — URINALYSIS, MICROSCOPIC ONLY

## 2012-11-17 LAB — CBC WITH DIFFERENTIAL/PLATELET
Basophils Relative: 0 % (ref 0–1)
Eosinophils Absolute: 0.3 10*3/uL (ref 0.0–0.7)
HCT: 37.7 % (ref 36.0–46.0)
Hemoglobin: 12.7 g/dL (ref 12.0–15.0)
MCH: 30.8 pg (ref 26.0–34.0)
MCHC: 33.7 g/dL (ref 30.0–36.0)
Monocytes Absolute: 0.6 10*3/uL (ref 0.1–1.0)
Monocytes Relative: 6 % (ref 3–12)
RDW: 13.2 % (ref 11.5–15.5)

## 2012-11-17 LAB — URINALYSIS, ROUTINE W REFLEX MICROSCOPIC
Leukocytes, UA: NEGATIVE
Nitrite: NEGATIVE
Protein, ur: NEGATIVE mg/dL
pH: 5.5 (ref 5.0–8.0)

## 2012-11-17 LAB — POC HEMOCCULT BLD/STL (HOME/3-CARD/SCREEN)

## 2012-11-17 LAB — TROPONIN I: Troponin I: <0.3 ng/mL (ref <0.30)

## 2012-11-17 LAB — PROTIME-INR: Prothrombin Time: 13.1 seconds (ref 11.6–15.2)

## 2012-11-17 LAB — LIPASE: Lipase: 15 U/L (ref 11–59)

## 2012-11-17 MED ORDER — PANTOPRAZOLE SODIUM 40 MG IV SOLR
40.0000 mg | Freq: Two times a day (BID) | INTRAVENOUS | Status: DC
  Administered 2012-11-17 – 2012-11-20 (×6): 40 mg via INTRAVENOUS
  Filled 2012-11-17 (×7): qty 40

## 2012-11-17 MED ORDER — INFLUENZA VIRUS VACC SPLIT PF IM SUSP
0.5000 mL | INTRAMUSCULAR | Status: AC
  Filled 2012-11-17: qty 0.5

## 2012-11-17 MED ORDER — ENOXAPARIN SODIUM 40 MG/0.4ML ~~LOC~~ SOLN
40.0000 mg | SUBCUTANEOUS | Status: DC
  Filled 2012-11-17: qty 0.4

## 2012-11-17 MED ORDER — ALBUTEROL SULFATE HFA 108 (90 BASE) MCG/ACT IN AERS: 2.0000 | INHALATION_SPRAY | Freq: Four times a day (QID) | RESPIRATORY_TRACT | Status: DC | PRN

## 2012-11-17 MED ORDER — ONDANSETRON 4 MG PO TBDP
4.0000 mg | ORAL_TABLET | Freq: Three times a day (TID) | ORAL | Status: DC | PRN
  Administered 2012-11-18: 4 mg via ORAL
  Filled 2012-11-17 (×2): qty 1

## 2012-11-17 MED ORDER — BIOTENE DRY MOUTH MT LIQD
15.0000 mL | Freq: Two times a day (BID) | OROMUCOSAL | Status: DC
  Administered 2012-11-18: 15 mL via OROMUCOSAL

## 2012-11-17 MED ORDER — SODIUM CHLORIDE 0.9 % IV SOLN
INTRAVENOUS | Status: DC
  Administered 2012-11-17 – 2012-11-18 (×3): via INTRAVENOUS

## 2012-11-17 MED ORDER — CHLORHEXIDINE GLUCONATE 0.12 % MT SOLN
15.0000 mL | Freq: Two times a day (BID) | OROMUCOSAL | Status: DC
  Administered 2012-11-17: 15 mL via OROMUCOSAL
  Filled 2012-11-17 (×8): qty 15

## 2012-11-17 MED ORDER — PANTOPRAZOLE SODIUM 40 MG IV SOLR: 40.0000 mg | INTRAVENOUS | Status: DC

## 2012-11-17 NOTE — Assessment & Plan Note (Signed)
Assessment: Patient presents with acute onset of BRBPR, LLQ abdominal pain, concerning for possible diverticular bleed, although wouldn't fully explain her presenting acute vomiting. She also has significant tobacco abuse history (quit 5 years ago), therefore, at increased risk for esophageal/ gastric cancers. Of note, no personal or family history of colon cancer. No heavy NSAID use.  Plan:      Will admit to [facility] under OBS for continued evaluation and monitoring.   NPO for now.  Will likely need inpatient GI evaluation (no prior colonoscopy/ EGD) - On call is [facility] [phone] (have not yet been called)  I spoke with the on-call resident, Dr. Everardo Beals, who will evaluate the patient once arrives on the floor.   I have ordered STAT CBC with diff, CMET, lipase, urinalysis, urine preg.  Plan of care was discussed with

## 2012-11-17 NOTE — H&P (Signed)
Hospital Admission Note Date: 11/17/2012  Patient name: [name] Medical record number: [id number] Date of birth: [birth year] Age: 46 y.o. Gender: female PCP: Darden Palmer, MD  Medical Service: Internal Medicine Teaching Service--[facility]  Attending physician: Dr. Eben Burow   1st Contact: Dr. Cicero Duck 2nd Contact: Dr. Everardo Beals   [id number] After 5 pm or weekends: 1st Contact:      Pager: [phone] 2nd Contact:      Pager: [phone]  Chief Complaint: persistent nausea and vomiting   History of Present Illness:[name] is a 46 year old white female with PMH COPD and HTN who was seen in [facility] this afternoon for ED follow up.  She was recently evaluated in the ED on 11/16/2012 for acute onset nausea and vomiting x1 day with nonbloody vomiting x 10 episodes and associated with lower quadrant campy abdominal pain.  She claims that she woke up at 4am on 11/16/12 feeling unwell, with nausea, and started vomiting.  Around 9am she came to the ED for her complaints which was thought to be likely secondary to viral gastroenteritis and was subsequently treated with phenergan that stabilized her nausea. She returned home only to have acute onset of watery stools with large amounts of bright red blood x 4 times.  She claims the blood initially looked rusty red then turned bright red with subsequent BMs.  She also endorses generalized weakness, cold sweats with vomiting, occasional lightheadedness worse with movement (claims to have long hx of vertigo), and persistent abdominal cramping at its worst 8-10/10, localized mainly to suprapubic region and tenderness to palpation of LLQ.  [name] explains that for the past two months she has had poor appetite eating very small amounts but denies any weight loss however notes bloating.  She takes nexium for heart burn and says it was exacerbated with coffee intake.  She currently denies any fever, chills, chest pain, shortness of breath, urinary complaints,  diarrhea or constipation.    Of note, [name] denies any similar episodes in the past.  She has no prior history of PUD, no history of intense NSAID use, and no personal or family history of colon cancer.  Her Last menstrual cycle was 3 weeks ago, she has had tubal ligation, and is currently on birth control pills for irregular periods.  Hx of heavy and prolonged periods for several years.  No obgyn follow-up.  Until last year, she had not seen a primary care physician for 19 years.  She denies smoking (quit ~5 years ago), drinking alcohol, or any illicit drug use.   Meds: Prescriptions prior to admission  Medication Sig Dispense Refill  . albuterol (PROVENTIL HFA;VENTOLIN HFA) 108 (90 BASE) MCG/ACT inhaler Inhale 2 puffs into the lungs every 6 (six) hours as needed. For SOB      . albuterol (PROVENTIL) (2.5 MG/3ML) 0.083% nebulizer solution Take 3 mLs (2.5 mg total) by nebulization every 6 (six) hours as needed for wheezing.  75 mL  12  . diphenhydrAMINE (BENADRYL) 25 mg capsule Take 50-75 mg by mouth at bedtime as needed. For sleep      . esomeprazole (NEXIUM) 40 MG capsule Take 40 mg by mouth daily before breakfast.      . lisinopril (PRINIVIL,ZESTRIL) 20 MG tablet Take 1 tablet (20 mg total) by mouth daily.  90 tablet  2  . Multiple Vitamin (MULTIVITAMIN) tablet Take 1 tablet by mouth daily.      . norgestimate-ethinyl estradiol (SPRINTEC 28) 0.25-35 MG-MCG tablet Take 1  tablet by mouth daily.  1 Package  11  . promethazine (PHENERGAN) 25 MG tablet Take 1 tablet (25 mg total) by mouth every 6 (six) hours as needed for nausea.  16 tablet  0   Allergies: Allergies as of 11/17/2012 - Review Complete 11/17/2012  Allergen Reaction Noted  . Sulfa drugs cross reactors Nausea And Vomiting 12/21/2011   Past Medical History  Diagnosis Date  . COPD (chronic obstructive pulmonary disease)     non-oxygen dependent  . Hypertension    Past Surgical History  Procedure Date  . Tubal ligation     Family History  Problem Relation Age of Onset  . Hypertension Mother   . Stroke Mother   . Hypertension Father   . Heart disease Father   . COPD Sister   . Heart disease Brother   . Hypertension Brother   . Cancer Maternal Aunt   . Cancer Paternal Aunt   . Cancer Maternal Grandmother   . Heart disease Maternal Grandfather   . Hypertension Maternal Grandfather   . Heart disease Paternal Grandmother   . Diabetes Paternal Grandmother   . Heart disease Paternal Grandfather   . Hypertension Paternal Grandfather    History   Social History  . Marital Status: Married    Spouse Name: N/A    Number of Children: 5  . Years of Education: 10th grade   Occupational History  . unemployed    Social History Main Topics  . Smoking status: Former Smoker -- 1.0 packs/day for 25 years    Types: Cigarettes    Quit date: 01/05/2008  . Smokeless tobacco: Never Used  . Alcohol Use: No  . Drug Use: No  . Sexually Active: Yes    Birth Control/ Protection: Surgical   Other Topics Concern  . Not on file   Social History Narrative   Lives in [place] with husband and extended family.   Review of Systems: Pertinent items are noted in HPI.  Physical Exam: Blood pressure 129/77, pulse 94, temperature 98.2 F (36.8 C), temperature source Oral, resp. rate 20, last menstrual period 10/28/2012, SpO2 99.00%. Vitals reviewed. General: resting in bed, NAD HEENT: PERRLA, EOMI, no scleral icterus Cardiac: Tachycardia, no rubs, murmurs or gallops Pulm: clear to auscultation bilaterally, no wheezes, rales, or rhonchi Abd: soft, tenderness to deep palpation suprapubic and LLQ, nondistended, BS present, + stretch marks Ext: warm and well perfused, no pedal edema, +2dp b/l Neuro: alert and oriented X3, cranial nerves II-XII grossly intact, strength and sensation to light touch equal in bilateral upper and lower extremities Rectum: +fobt, external hemorrhoid not thrombosed  Lab results: Basic  Metabolic Panel:  Basename 11/16/12 0844  NA 136  K 3.9  CL 102  CO2 20  GLUCOSE 116*  BUN 9  CREATININE 0.74  CALCIUM 9.6  MG --  PHOS --   Liver Function Tests:  [facility] 11/16/12 0844  AST 19  ALT 12  ALKPHOS 59  BILITOT 0.5  PROT 7.6  ALBUMIN 3.8    Basename 11/16/12 0844  LIPASE 17  AMYLASE --   CBC:  Basename 11/17/12 1632 11/16/12 0844  WBC 10.5 16.7*  NEUTROABS 7.4 14.1*  HGB 12.7 14.7  HCT 37.7 42.7  MCV 91.5 89.3  PLT 257 272   Coagulation:  Basename 11/17/12 1632  LABPROT 13.1  INR 1.00   Urinalysis:  Basename 11/16/12 1120  COLORURINE YELLOW  LABSPEC 1.025  PHURINE 5.5  GLUCOSEU NEGATIVE  HGBUR TRACE*  BILIRUBINUR NEGATIVE  KETONESUR >  80*  PROTEINUR NEGATIVE  UROBILINOGEN 0.2  NITRITE NEGATIVE  LEUKOCYTESUR NEGATIVE   Assessment & Plan by Problem: [name] is a 46 year female with PMH COPD and HTN admitted for nausea/vomiting, abdominal pain and BRBPR x2 days.   BRBPR: unknown etiology.  Acute onset x2 days.  Associated with nausea, vomiting, and crampy abdominal pain.  Broad differential including: ischemic colitis (sudden onset, crampy pain) vs. Hemorrhoids (external hemorrhoids present but less likely given blood in stool) vs. AVM vs. Diverticulosis/diverticulitis (resolved leukocytosis, afebrile) vs. Malignancy (no weight-loss, but loss of appetite) vs. Inflammatory bowel disease (sudden onset may make this less likely) vs. Infection.  Possible upper GIB may also be included in differential secondary to PUD given long hx of GERD, substernal pain, worse with eating.  FOBT+, Hb on admission: 12.7, afebrile, wbc on admission: 10.5 (improved from 16.7 on 11/16/12).  INR 1. Trop x1 negative. Last BM--bloody this AM.   -admit to med/surg -cbc q8H, consider transfusion if Hb <7 -2 large bore IV -consult GI -NPO, possible colonoscopy tomorrow -stool studies: ova parasites, cdiff, lactoferrin, gram stain, culture -IV Protonix  BID -IVF -continue to monitor  #Nausea/Vomiting: possibly secondary viral gastroenteritis.  1 day hx of vomiting.  Resolved with phenergan.  Decreased po intake, loss of appetite.   -Zofran PRN -NPO for now  #HTN: BP on admission: 129/77.  On home regimen: Lisinopril 20mg  -hold home med for now -continue to monitor   #COPD: on albuterol inhaler PRN at home. Obese female. No PFTs on chart review. -continue albuterol -continue to monitor  #GERD: on nexium at home -protonix -continue to monitor  Diet: NPO DVT Ppx: SCDs Dispo: Disposition is deferred at this time, awaiting improvement of current medical problems. Anticipated discharge in approximately 1-2 day(s).   The patient does have a current PCP Virgina Organ, Lindalou Hose, MD), therefore will be requiring OPC follow-up after discharge.   The patient does not have transportation limitations that hinder transportation to clinic appointments.  SignedDarden Palmer 11/17/2012, 6:12 PM

## 2012-11-17 NOTE — Progress Notes (Signed)
#  22 gauge NSL inserted left hand. Stanton Kidney Jazzelle Zhang RN 11/17/12 5PM

## 2012-11-17 NOTE — Progress Notes (Signed)
Subjective:    Patient: [name]   Age/ Gender: 46 y.o., female   MRN: [id number]  DOB: 1966-01-16     HPI: [name] is a 46 y.o. with a PMHx of hypertension and COPD (remote tobacco abuse), who presented to clinic today for the following:  1) ER Follow-up and new BRBPR - Patient evaluated at [facility] ER on 11/16/2012 for evaluation of acute onset nausea and vomiting yesterday AM, with nonbloody vomitus x 10 - which was thought likely 2/2 viral gastroenteritis. She was started on scheduled phenergan, with which nausea is stabilizing. Continues to have abdominal cramping in lower quadrant. Denies fevers, confirms cold sweats.   When she returned home, she had acute onset of watery stools comprised largely of bright red blood - has recurred total of 4 times. Confirms generalized weakness, occasional positional lightheadedness, persistent abdominal cramping. Denies similar episode in the past. No prior history of PUD, no history of intense NSAID use, no personal or family history of colon cancer. Last menstrual cycle 3 weeks ago.   Review of Systems: Per HPI.   Current Outpatient Medications: Medication Sig  . albuterol (PROVENTIL HFA;VENTOLIN HFA) 108 (90 BASE) MCG/ACT inhaler Inhale 2 puffs into the lungs every 6 (six) hours as needed. For SOB  . albuterol (PROVENTIL) (2.5 MG/3ML) 0.083% nebulizer solution Take 3 mLs (2.5 mg total) by nebulization every 6 (six) hours as needed for wheezing.  . diphenhydrAMINE (BENADRYL) 25 mg capsule Take 50-75 mg by mouth at bedtime as needed. For sleep  . esomeprazole (NEXIUM) 40 MG capsule Take 40 mg by mouth daily before breakfast.  . lisinopril (PRINIVIL,ZESTRIL) 20 MG tablet Take 1 tablet (20 mg total) by mouth daily.  . norgestimate-ethinyl estradiol (SPRINTEC 28) 0.25-35 MG-MCG tablet Take 1 tablet by mouth daily.  . promethazine (PHENERGAN) 25 MG tablet Take 1 tablet (25 mg total) by mouth every 6 (six) hours as needed for nausea.     Allergies  Allergen Reactions  . Sulfa Drugs Cross Reactors Nausea And Vomiting    "made me sick- I had to have IVs"    Past Medical History  Diagnosis Date  . COPD (chronic obstructive pulmonary disease)   . Hypertension     Past Surgical History  Procedure Date  . Tubal ligation      Objective:    Physical Exam: Filed Vitals:   11/17/12 1454  BP: 97/64  Pulse: 97  Temp: 97.2 F (36.2 C)      General: Vital signs reviewed and noted. Well-developed, well-nourished, in no acute distress; alert, appropriate and cooperative throughout examination.  Head: Normocephalic, atraumatic.  Eyes: conjunctivae/corneas clear. PERRL.  Ears: TM nonerythematous, not bulging, good light reflex bilaterally.  Nose: Mucous membranes moist, not inflammed, nonerythematous.  Throat: Oropharynx nonerythematous, no exudate appreciated.   Neck: No deformities, masses, or tenderness noted.  Lungs:  Normal respiratory effort. Clear to auscultation BL without crackles or wheezes.  Heart: RRR. S1 and S2 normal without gallop, rubs. No murmur.  Abdomen:  BS normoactive. Soft, Nondistended, TTP of BL lower quadrants with LEFT > Right. No rebound tenderness. No masses or organomegaly.  Rectal: Normal tone of rectal vault, non-thrombosed external hemorrhoids noted. Small amount of bright red mucousy blood noted in rectal vault.  Extremities: No pretibial edema.  Neurologic: A&O X3, CN II - XII are grossly intact. Motor strength is 5/5 in the all 4 extremities, Sensations intact to light touch.      Assessment/ Plan:  Case and plan of care discussed with attending physician, Dr. Jonah Blue.

## 2012-11-17 NOTE — Progress Notes (Signed)
1800 Patient arrived to floor from outpatient clinic. Placed on telemetry and Dr. Orlene Erm made aware of patient arrival.

## 2012-11-18 LAB — CBC
HCT: 31.7 % — ABNORMAL LOW (ref 36.0–46.0)
HCT: 33.9 % — ABNORMAL LOW (ref 36.0–46.0)
HCT: 33.2 % — ABNORMAL LOW (ref 36.0–46.0)
Hemoglobin: 11.1 g/dL — ABNORMAL LOW (ref 12.0–15.0)
Hemoglobin: 11.2 g/dL — ABNORMAL LOW (ref 12.0–15.0)
MCH: 30.4 pg (ref 26.0–34.0)
MCHC: 33.1 g/dL (ref 30.0–36.0)
MCHC: 32.7 g/dL (ref 30.0–36.0)
MCV: 90 fL (ref 78.0–100.0)
Platelets: 198 10*3/uL (ref 150–400)
RBC: 3.69 MIL/uL — ABNORMAL LOW (ref 3.87–5.11)
RDW: 13.2 % (ref 11.5–15.5)

## 2012-11-18 LAB — CLOSTRIDIUM DIFFICILE BY PCR: Toxigenic C. Difficile by PCR: NEGATIVE

## 2012-11-18 LAB — GRAM STAIN

## 2012-11-18 MED ORDER — NORGESTIMATE-ETH ESTRADIOL 0.25-35 MG-MCG PO TABS
1.0000 | ORAL_TABLET | Freq: Every day | ORAL | Status: DC
  Administered 2012-11-18 – 2012-11-20 (×3): 1 via ORAL
  Filled 2012-11-18 (×3): qty 1

## 2012-11-18 MED ORDER — NON FORMULARY: 1.0000 | Freq: Every day | Status: DC

## 2012-11-18 MED ORDER — HOME MED STORE IN PYXIS
1.0000 | Freq: Every day | Status: DC
  Filled 2012-11-18 (×2): qty 1

## 2012-11-18 MED ORDER — PEG 3350-KCL-NA BICARB-NACL 420 G PO SOLR
4000.0000 mL | Freq: Once | ORAL | Status: AC
  Administered 2012-11-18: 4000 mL via ORAL
  Filled 2012-11-18: qty 4000

## 2012-11-18 MED ORDER — SODIUM CHLORIDE 0.9 % IV SOLN
INTRAVENOUS | Status: DC
  Administered 2012-11-18: 20 mL/h via INTRAVENOUS
  Administered 2012-11-19: 500 mL via INTRAVENOUS

## 2012-11-18 NOTE — Consult Note (Signed)
[facility] CONSULT Reason for consult: GI bleeding Referring Physician: Family practice teaching service. Primary GI: None patient is unassigned  [name] is an 46 y.o. female.  HPI: Patient has had a long history of esophageal reflux and has been on omeprazole to Nexium for a number of years. Recently she has had what she describes as an upset GI system manifested Moore's abdominal cramping is been going on intermittently for about 6 weeks. She denies any rectal bleeding or change in her bowel movements. Apparently coffee made it worse. Her appetite is also been somewhat poor. She felt in her normal state of health until 2 nights ago when she awoke with cramping abdominal pain and diarrhea with nausea and vomiting. She went to the emergency room and was told that she had gastroenteritis. She returned home and then began to have worsening diarrhea with large quantities of bright red blood and severe cramping abdominal pain. She came back to the emergency room and was admitted. Her labs have been unremarkable for normal renal and hepatic functions other than slightly elevated glucose. WBC 16.7 now has dropped to 8.2. Hemoglobin was 15.7 now down to 10.5. Patient has had no more bloody stools in the past 12 hours. She notes that her nausea has improved and she is tolerating clear liquids. She has never had colonoscopy or sigmoidoscopy. No family history of colonic neoplasia or IBD. She lives in a house with 10 people and no one else is been sick but her. No history of vascular disease. A marked history of heart disease and vascular disease in her family  Past Medical History  Diagnosis Date  . COPD (chronic obstructive pulmonary disease)     non-oxygen dependent  . Hypertension     Past Surgical History  Procedure Date  . Tubal ligation     Family History  Problem Relation Age of Onset  . Hypertension Mother   . Stroke Mother   . Hypertension Father   . Heart disease Father    . COPD Sister   . Heart disease Brother   . Hypertension Brother   . Cancer Maternal Aunt   . Cancer Paternal Aunt   . Cancer Maternal Grandmother   . Heart disease Maternal Grandfather   . Hypertension Maternal Grandfather   . Heart disease Paternal Grandmother   . Diabetes Paternal Grandmother   . Heart disease Paternal Grandfather   . Hypertension Paternal Grandfather     Social History:  reports that she quit smoking about 4 years ago. Her smoking use included Cigarettes. She has a 25 pack-year smoking history. She has never used smokeless tobacco. She reports that she does not drink alcohol or use illicit drugs.  Allergies:  Allergies  Allergen Reactions  . Sulfa Drugs Cross Reactors Nausea And Vomiting    "made me sick- I had to have IVs"    Medications;    . antiseptic oral rinse  15 mL Mouth Rinse q12n4p  . chlorhexidine  15 mL Mouth Rinse BID  . influenza  inactive virus vaccine  0.5 mL Intramuscular Tomorrow-1000  . pantoprazole (PROTONIX) IV  40 mg Intravenous Q12H  . [DISCONTINUED] enoxaparin (LOVENOX) injection  40 mg Subcutaneous Q24H  . [DISCONTINUED] pantoprazole (PROTONIX) IV  40 mg Intravenous Q24H   PRN Meds albuterol, ondansetron Results for orders placed during the hospital encounter of 11/17/12 (from the past 48 hour(s))  CBC     Status: Abnormal   Collection Time   11/17/12  9:56 PM  Component Value Range Comment   WBC 11.7 (*) 4.0 - 10.5 K/uL    RBC 3.86 (*) 3.87 - 5.11 MIL/uL    Hemoglobin 11.5 (*) 12.0 - 15.0 g/dL DELTA CHECK NOTED   HCT 34.9 (*) 36.0 - 46.0 %    MCV 90.4  78.0 - 100.0 fL    MCH 29.8  26.0 - 34.0 pg    MCHC 33.0  30.0 - 36.0 g/dL    RDW 52.8  41.3 - 24.4 %    Platelets 226  150 - 400 K/uL   STOOL CULTURE     Status: Normal (Preliminary result)   Collection Time   11/18/12 12:12 AM      Component Value Range Comment   Specimen Description STOOL      Special Requests NONE      Culture Culture reincubated for better  growth      Report Status PENDING     GRAM STAIN     Status: Normal   Collection Time   11/18/12 12:12 AM      Component Value Range Comment   Specimen Description STOOL      Special Requests Normal      Gram Stain        Value: RARE WBC PRESENT, PREDOMINANTLY PMN     NO SQUAMOUS EPITHELIAL CELLS SEEN     MODERATE GRAM POSITIVE RODS     MODERATE GRAM NEGATIVE RODS     MODERATE GRAM POSITIVE COCCI   Report Status 11/18/2012 FINAL     CLOSTRIDIUM DIFFICILE BY PCR     Status: Normal   Collection Time   11/18/12 12:12 AM      Component Value Range Comment   C difficile by pcr NEGATIVE  NEGATIVE   CBC     Status: Abnormal   Collection Time   11/18/12  5:15 AM      Component Value Range Comment   WBC 8.2  4.0 - 10.5 K/uL    RBC 3.49 (*) 3.87 - 5.11 MIL/uL    Hemoglobin 10.5 (*) 12.0 - 15.0 g/dL    HCT 01.0 (*) 27.2 - 46.0 %    MCV 90.8  78.0 - 100.0 fL    MCH 30.1  26.0 - 34.0 pg    MCHC 33.1  30.0 - 36.0 g/dL    RDW 53.6  64.4 - 03.4 %    Platelets 191  150 - 400 K/uL     No results found. ROS: Please see present illness Constitutional: HEENT: Cardiovascular: Respiratory: GI: GU: Musculoskeletal: Neuro/Psychiatric: Endocrine/Heme:            Blood pressure 124/81, pulse 86, temperature 98.1 F (36.7 C), temperature source Oral, resp. rate 16, height 5\' 2"  (1.575 m), weight 102.8 kg (226 lb 10.1 oz), last menstrual period 10/28/2012, SpO2 98.00%.  Physical exam:   Gen.-somewhat chubby white female no acute distress Eyes-sclerae are nonicteric Lungs-clear Heart-regular rate and rhythm without murmurs or gallops  Assessment: 1. Acute lower GI bleed. This could be anything from an infectious colitis to ischemic colitis or some sort of tumor of her lower GI tract. Patient does seem to be improving but she has had a fairly significant drop in hemoglobin. I think she needs a colonoscopy.  Plan: Have discussed this with the patient and her husband. We'll plan on  proceeding with colonoscopy tomorrow primarily to rule out an occult lower GI neoplasm or ischemic colitis. The risk and benefits of the procedure have been discussed.  Neev Mcmains JR,Shahida Schnackenberg L 11/18/2012, 10:45 AM

## 2012-11-18 NOTE — Progress Notes (Signed)
Subjective: [name] was seen and examined at bedside.  She claims to feel great and has no major complaints at this time.  She had one bloody BM late last night but nothing today.  She was seen by GI who informed her that she will go for colonoscopy tomorrow.  She denies any fever, chills, N/V/D, chest pain, shortness of breath, or abdominal pain at this time.    Objective: Vital signs in last 24 hours: Filed Vitals:   11/17/12 1952 11/17/12 1953 11/17/12 1958 11/18/12 0538  BP: 143/97 142/82  124/81  Pulse: 101 102  86  Temp:    98.1 F (36.7 C)  TempSrc:    Oral  Resp:    16  Height:   5\' 2"  (1.575 m)   Weight:   226 lb 10.1 oz (102.8 kg)   SpO2:    98%   Weight change:   Intake/Output Summary (Last 24 hours) at 11/18/12 1316 Last data filed at 11/18/12 0634  Gross per 24 hour  Intake 1347.91 ml  Output      0 ml  Net 1347.91 ml   Vitals reviewed. General: resting in bed, NAD HEENT: PERRLA, EOMI, no scleral icterus Cardiac: RRR, no rubs, murmurs or gallops Pulm: clear to auscultation bilaterally, no wheezes, rales, or rhonchi Abd: soft, nontender, nondistended, BS present Ext: warm and well perfused, no pedal edema Neuro: alert and oriented X3, cranial nerves II-XII grossly intact, strength and sensation to light touch equal in bilateral upper and lower extremities  Lab Results: Basic Metabolic Panel:  Lab 11/17/12 1610 11/16/12 0844  NA 138 136  K 3.6 3.9  CL 104 102  CO2 24 20  GLUCOSE 90 116*  BUN 6 9  CREATININE 0.81 0.74  CALCIUM 8.5 9.6  MG -- --  PHOS -- --   Liver Function Tests:  Lab 11/17/12 1632 11/16/12 0844  AST 14 19  ALT 11 12  ALKPHOS 53 59  BILITOT 0.3 0.5  PROT 6.5 7.6  ALBUMIN 3.1* 3.8    Lab 11/17/12 1632 11/16/12 0844  LIPASE 15 17  AMYLASE -- --   CBC:  Lab 11/18/12 0515 11/17/12 2156 11/17/12 1632 11/16/12 0844  WBC 8.2 11.7* -- --  NEUTROABS -- -- 7.4 14.1*  HGB 10.5* 11.5* -- --  HCT 31.7* 34.9* -- --  MCV 90.8 90.4  -- --  PLT 191 226 -- --   Cardiac Enzymes:  Lab 11/17/12 1632  CKTOTAL --  CKMB --  CKMBINDEX --  TROPONINI <0.30   Coagulation:  Lab 11/17/12 1632  LABPROT 13.1  INR 1.00   Urinalysis:  Lab 11/17/12 1713 11/16/12 1120  COLORURINE YELLOW YELLOW  LABSPEC 1.031* 1.025  PHURINE 5.5 5.5  GLUCOSEU NEG NEGATIVE  HGBUR SMALL* TRACE*  BILIRUBINUR SMALL* NEGATIVE  KETONESUR NEG >80*  PROTEINUR NEG NEGATIVE  UROBILINOGEN 1 0.2  NITRITE NEG NEGATIVE  LEUKOCYTESUR NEG NEGATIVE   Micro Results: Recent Results (from the past 240 hour(s))  STOOL CULTURE     Status: Normal (Preliminary result)   Collection Time   11/18/12 12:12 AM      Component Value Range Status Comment   Specimen Description STOOL   Final    Special Requests NONE   Final    Culture Culture reincubated for better growth   Final    Report Status PENDING   Incomplete   GRAM STAIN     Status: Normal   Collection Time   11/18/12 12:12 AM  Component Value Range Status Comment   Specimen Description STOOL   Final    Special Requests Normal   Final    Gram Stain     Final    Value: RARE WBC PRESENT, PREDOMINANTLY PMN     NO SQUAMOUS EPITHELIAL CELLS SEEN     MODERATE GRAM POSITIVE RODS     MODERATE GRAM NEGATIVE RODS     MODERATE GRAM POSITIVE COCCI   Report Status 11/18/2012 FINAL   Final   CLOSTRIDIUM DIFFICILE BY PCR     Status: Normal   Collection Time   11/18/12 12:12 AM      Component Value Range Status Comment   C difficile by pcr NEGATIVE  NEGATIVE Final    Medications: I have reviewed the patient's current medications. Scheduled Meds:   . antiseptic oral rinse  15 mL Mouth Rinse q12n4p  . chlorhexidine  15 mL Mouth Rinse BID  . influenza  inactive virus vaccine  0.5 mL Intramuscular Tomorrow-1000  . pantoprazole (PROTONIX) IV  40 mg Intravenous Q12H  . [COMPLETED] polyethylene glycol-electrolytes  4,000 mL Oral Once  . [DISCONTINUED] enoxaparin (LOVENOX) injection  40 mg Subcutaneous Q24H   . [DISCONTINUED] pantoprazole (PROTONIX) IV  40 mg Intravenous Q24H   Continuous Infusions:   . sodium chloride 125 mL/hr at 11/18/12 1114   PRN Meds:.albuterol, ondansetron Assessment/Plan: [name] is a 46 year female with PMH COPD and HTN admitted for nausea/vomiting, abdominal pain and GIB.   BRBPR: unknown etiology. Acute onset x2 days. Associated with nausea, vomiting, and crampy abdominal pain. Broad differential including: ischemic colitis (sudden onset, crampy pain) vs. Infectious etiology.  Hemorrhoids (external hemorrhoids present but less likely given blood in stool) vs. AVM vs. Diverticulosis/diverticulitis (resolved leukocytosis, afebrile) vs. Malignancy (no weight-loss, but loss of appetite) vs. Inflammatory bowel disease (sudden onset may make this less likely).  Possible upper GIB may also be included in differential secondary to PUD given long hx of GERD, substernal pain, worse with eating. FOBT+, Hb on admission: 12.7, afebrile, wbc on admission: 10.5 (improved from 16.7 on 11/16/12). INR 1. Trop x1 negative. Last BM--bloody this AM.  -cbc q8H, consider transfusion if Hb <7.  Hb today down to 10.5 -[facility] following--Dr. Randa Evens.  Colonoscopy tomorrow.  -NPO, possible colonoscopy tomorrow  -stool studies: ova parasites, cdiff negative, lactoferrin, gram stain: moderate gram positive rods, gram negative rods, and moderate gram positive cocci, culture pending  -IV Protonix BID  -IVF  -continue to monitor   #Nausea/Vomiting: possibly secondary viral gastroenteritis. 1 day hx of vomiting. Resolved with phenergan. Decreased po intake, loss of appetite. Improved today.  -Zofran PRN  -NPO   #HTN: BP on admission: 129/77. On home regimen: Lisinopril 20mg   -hold home med for now  -continue to monitor   #COPD: on albuterol inhaler PRN at home. Obese female. No PFTs on chart review.  -continue albuterol  -continue to monitor   #GERD: on nexium at home  -protonix   -continue to monitor   Diet: NPO  DVT Ppx: SCDs  Dispo: Disposition is deferred at this time, awaiting improvement of current medical problems. Anticipated discharge in approximately 1-2 day(s).  The patient does have a current PCP Virgina Organ, Lindalou Hose, MD), therefore will be requiring OPC follow-up after discharge.  The patient does not have transportation limitations that hinder transportation to clinic appointments.    LOS: 1 day   Darden Palmer 11/18/2012, 1:16 PM

## 2012-11-18 NOTE — Progress Notes (Signed)
Notified MD on call that patient had a small BM that was completely bright red blood.  Patient states, "that is the way they have looked since this morning".  No new orders given. Will continue to monitor. Jerrye Bushy

## 2012-11-18 NOTE — H&P (Signed)
Internal Medicine Teaching Service Attending Note Date: 11/18/2012  Patient name: [name]  Medical record number: [id number]  Date of birth: July 29, 1966    This patient has been seen and discussed with the house staff. Please see their note for complete details. I concur with their findings with the following additions/corrections: Patient is a 46 year old female with past medical history most significant for hypertension and COPD who was seen in the ER one day prior to admission for acute nausea and vomiting. Patient was being seen in the clinic for followup where she complained of persistent nausea and vomiting along with abdominal pain. She also complained of bloody diarrhea And was subsequently admitted to the inpatient service. Patient was apparently well when she suddenly started having nausea and vomiting.she vomited about 10 times in the vomitus contained food particles without any blood. Nausea and vomiting was associated with suprapubic abdominal pain, 8/10 in intensity, no exacerbating or relieving factors non-radiating. Patient subsequently started having loose bowel movements but could not appreciate any blood until the next morning when she actually looked into the commode. The patient had a total of about 10 episodes of loose bowel movements. She could not quantify the amount of blood. Patient denied any chest pain, shortness of breath, palpitations, history of over-the-counter pain medication usage, change in appetite, change in drug regimen, recent weight loss or fever or chills.  Review of systems otherwise negative except what is noted above.  Past medical history, surgical history, social history and family history was reviewed and as per resident's note.  BP 153/79  Pulse 90  Temp 98.1 F (36.7 C) (Oral)  Resp 18  Ht 5\' 2"  (1.575 m)  Wt 226 lb 10.1 oz (102.8 kg)  BMI 41.45 kg/m2  SpO2 97%  LMP 10/28/2012 Patient was lying in bed comfortably with her partner in the room  sitting by the side of the bed. She was alert and oriented and pleasant. She was cooperative throughout examination. S1-S2 normal, no murmurs rubs or gallops noted, regular rate and rhythm Clear to auscultation bilaterally Abdomen was soft, nontender, bowel sounds normal in all 4 quadrants, no organomegaly appreciated Extremities were without any edema and intact distal pulses Neurological exam is unremarkable  Patient's labs and imaging studies were reviewed.  Assessment and plan:  Patient is a 46 year old female with past medical history most significant for hypertension and COPD Who comes in with sudden onset bleeding per rectum associated with loose bowel movements, nausea and vomiting. Patient also had suprapubic abdominal pain which is resolved at this time.  Top differentials in this case would be diverticular disease versus infectious colitis. Malignancy or ischemic colitis would be low on the differentials just based on the history and physical examination. We have consulted GI at this time and they are planning to do colonoscopy. We will continue to monitor patient's hemoglobin which has dropped significantly but does not require transfusion at this time. The threshold for starting antibiotics for presumed infectious colitis would be low. Patient does not have any fevers and generally does not look toxic which is the main reason for holding antibiotics at this time.  Rest of the medical management as per resident's note.    Lars Mage 11/18/2012, 4:55 PM

## 2012-11-19 ENCOUNTER — Encounter (HOSPITAL_COMMUNITY)
Admission: AD | Disposition: A | Discharge status: Home / Self Care | Payer: Self-pay | Source: Ambulatory Visit | Attending: Internal Medicine

## 2012-11-19 ENCOUNTER — Encounter (HOSPITAL_COMMUNITY): Payer: Self-pay

## 2012-11-19 DIAGNOSIS — D62 Acute posthemorrhagic anemia: Secondary | ICD-10-CM | POA: Diagnosis present

## 2012-11-19 HISTORY — PX: COLONOSCOPY: SHX5424

## 2012-11-19 LAB — OVA AND PARASITE EXAMINATION

## 2012-11-19 LAB — CBC
MCV: 89.8 fL (ref 78.0–100.0)
Platelets: 183 10*3/uL (ref 150–400)
RDW: 12.7 % (ref 11.5–15.5)
WBC: 8.5 10*3/uL (ref 4.0–10.5)

## 2012-11-19 LAB — FECAL LACTOFERRIN, QUANT

## 2012-11-19 SURGERY — COLONOSCOPY
Anesthesia: Moderate Sedation

## 2012-11-19 MED ORDER — MIDAZOLAM HCL 5 MG/ML IJ SOLN
INTRAMUSCULAR | Status: AC
  Filled 2012-11-19: qty 2

## 2012-11-19 MED ORDER — FENTANYL CITRATE 0.05 MG/ML IJ SOLN
INTRAMUSCULAR | Status: DC | PRN
  Administered 2012-11-19 (×5): 25 ug via INTRAVENOUS

## 2012-11-19 MED ORDER — MIDAZOLAM HCL 5 MG/5ML IJ SOLN
INTRAMUSCULAR | Status: DC | PRN
  Administered 2012-11-19: 2.5 mg via INTRAVENOUS
  Administered 2012-11-19 (×4): 2 mg via INTRAVENOUS
  Administered 2012-11-19: 1.5 mg via INTRAVENOUS

## 2012-11-19 MED ORDER — FENTANYL CITRATE 0.05 MG/ML IJ SOLN
INTRAMUSCULAR | Status: AC
  Filled 2012-11-19: qty 2

## 2012-11-19 MED ORDER — DIPHENHYDRAMINE HCL 50 MG/ML IJ SOLN
INTRAMUSCULAR | Status: AC
  Filled 2012-11-19: qty 1

## 2012-11-19 NOTE — Progress Notes (Signed)
Subjective: [name] was seen and examined at bedside.  She claims to feel great and has no major complaints at this time.  She is ready for her colonoscopy this morning.  She had one non-bloody BM this morning, no abdominal pain.   She denies any fever, chills, N/V/D, chest pain, shortness of breath at this time.    Objective: Vital signs in last 24 hours: Filed Vitals:   11/18/12 0538 11/18/12 1340 11/18/12 2050 11/19/12 0500  BP: 124/81 153/79 153/92 143/89  Pulse: 86 90 100 88  Temp: 98.1 F (36.7 C) 98.1 F (36.7 C) 98 F (36.7 C) 97.8 F (36.6 C)  TempSrc: Oral Oral Oral Oral  Resp: 16 18 19 16   Height:      Weight:      SpO2: 98% 97% 99% 98%   Weight change:   Intake/Output Summary (Last 24 hours) at 11/19/12 0857 Last data filed at 11/19/12 0500  Gross per 24 hour  Intake 4184.17 ml  Output      0 ml  Net 4184.17 ml   Vitals reviewed. General: resting in bed, NAD HEENT: PERRLA, EOMI, no scleral icterus Cardiac: RRR, no rubs, murmurs or gallops Pulm: clear to auscultation bilaterally, no wheezes, rales, or rhonchi Abd: soft, nontender, nondistended, BS present Ext: warm and well perfused, no pedal edema Neuro: alert and oriented X3, cranial nerves II-XII grossly intact, strength and sensation to light touch equal in bilateral upper and lower extremities  Lab Results: Basic Metabolic Panel:  Lab 11/17/12 8250 11/16/12 0844  NA 138 136  K 3.6 3.9  CL 104 102  CO2 24 20  GLUCOSE 90 116*  BUN 6 9  CREATININE 0.81 0.74  CALCIUM 8.5 9.6  MG -- --  PHOS -- --   Liver Function Tests:  Lab 11/17/12 1632 11/16/12 0844  AST 14 19  ALT 11 12  ALKPHOS 53 59  BILITOT 0.3 0.5  PROT 6.5 7.6  ALBUMIN 3.1* 3.8    Lab 11/17/12 1632 11/16/12 0844  LIPASE 15 17  AMYLASE -- --   CBC:  Lab 11/19/12 0530 11/18/12 2208 11/17/12 1632 11/16/12 0844  WBC 8.5 10.5 -- --  NEUTROABS -- -- 7.4 14.1*  HGB 10.9* 11.2* -- --  HCT 32.5* 33.2* -- --  MCV 89.8 90.0 -- --   PLT 183 198 -- --   Cardiac Enzymes:  Lab 11/17/12 1632  CKTOTAL --  CKMB --  CKMBINDEX --  TROPONINI <0.30   Coagulation:  Lab 11/17/12 1632  LABPROT 13.1  INR 1.00   Urinalysis:  Lab 11/17/12 1713 11/16/12 1120  COLORURINE YELLOW YELLOW  LABSPEC 1.031* 1.025  PHURINE 5.5 5.5  GLUCOSEU NEG NEGATIVE  HGBUR SMALL* TRACE*  BILIRUBINUR SMALL* NEGATIVE  KETONESUR NEG >80*  PROTEINUR NEG NEGATIVE  UROBILINOGEN 1 0.2  NITRITE NEG NEGATIVE  LEUKOCYTESUR NEG NEGATIVE   Micro Results: Recent Results (from the past 240 hour(s))  STOOL CULTURE     Status: Normal (Preliminary result)   Collection Time   11/18/12 12:12 AM      Component Value Range Status Comment   Specimen Description STOOL   Final    Special Requests NONE   Final    Culture Culture reincubated for better growth   Final    Report Status PENDING   Incomplete   GRAM STAIN     Status: Normal   Collection Time   11/18/12 12:12 AM      Component Value Range Status Comment  Specimen Description STOOL   Final    Special Requests Normal   Final    Gram Stain     Final    Value: RARE WBC PRESENT, PREDOMINANTLY PMN     NO SQUAMOUS EPITHELIAL CELLS SEEN     MODERATE GRAM POSITIVE RODS     MODERATE GRAM NEGATIVE RODS     MODERATE GRAM POSITIVE COCCI   Report Status 11/18/2012 FINAL   Final   CLOSTRIDIUM DIFFICILE BY PCR     Status: Normal   Collection Time   11/18/12 12:12 AM      Component Value Range Status Comment   C difficile by pcr NEGATIVE  NEGATIVE Final    Medications: I have reviewed the patient's current medications. Scheduled Meds:    . antiseptic oral rinse  15 mL Mouth Rinse q12n4p  . chlorhexidine  15 mL Mouth Rinse BID  . influenza  inactive virus vaccine  0.5 mL Intramuscular Tomorrow-1000  . norgestimate-ethinyl estradiol  1 tablet Oral Daily  . pantoprazole (PROTONIX) IV  40 mg Intravenous Q12H  . [COMPLETED] polyethylene glycol-electrolytes  4,000 mL Oral Once  . [DISCONTINUED] home  med stored in pyxis  1 each Oral Q2000  . [DISCONTINUED] NON FORMULARY 1 tablet  1 tablet Oral Q2000   Continuous Infusions:    . sodium chloride 75 mL/hr at 11/18/12 1427  . sodium chloride 20 mL/hr at 11/19/12 0500   PRN Meds:.albuterol, ondansetron Assessment/Plan: [name] is a 45 year female with PMH COPD and HTN admitted for nausea/vomiting, abdominal pain and GIB.   BRBPR: unknown etiology. Acute onset x2 days. Associated with nausea, vomiting, and crampy abdominal pain. Broad differential including: ischemic colitis (sudden onset, crampy pain) vs. Infectious etiology.  Hemorrhoids (external hemorrhoids present but less likely given blood in stool) vs. AVM vs. Diverticulosis/diverticulitis (resolved leukocytosis, afebrile) vs. Malignancy (no weight-loss, but loss of appetite) vs. Inflammatory bowel disease (sudden onset may make this less likely).  Possible upper GIB may also be included in differential secondary to PUD given long hx of GERD, substernal pain, worse with eating. FOBT+, Hb on admission: 12.7, afebrile, wbc on admission: 10.5 (improved from 16.7 on 11/16/12). INR 1. Trop x1 negative. Last BM--non-bloody this AM.  -cbc q8H, consider transfusion if Hb <7.  Hb today 10.9 -[facility] following--Dr. Randa Evens.  Colonoscopy today  -NPO, colonoscopy today -stool studies: ova parasites, cdiff negative, lactoferrin, gram stain: moderate gram positive rods, gram negative rods, and moderate gram positive cocci, culture pending  -IV Protonix BID  -IVF  -continue to monitor   #Nausea/Vomiting: possibly secondary viral gastroenteritis. 1 day hx of vomiting. Resolved with phenergan. Decreased po intake, loss of appetite. Improved today.  -Zofran PRN  -NPO   #HTN: BP on admission: 129/77. On home regimen: Lisinopril 20mg   -hold home med for now  -continue to monitor   #COPD: on albuterol inhaler PRN at home. Obese female. No PFTs on chart review.  -continue albuterol  -continue to  monitor   #GERD: on nexium at home  -protonix  -continue to monitor   Diet: NPO  DVT Ppx: SCDs  Dispo: Disposition is deferred at this time, awaiting improvement of current medical problems. Anticipated discharge in approximately 1-2 day(s).  The patient does have a current PCP Virgina Organ, Lindalou Hose, MD), therefore will be requiring OPC follow-up after discharge.  The patient does not have transportation limitations that hinder transportation to clinic appointments.    LOS: 2 days   Darden Palmer 11/19/2012, 8:57 AM

## 2012-11-19 NOTE — Interval H&P Note (Signed)
History and Physical Interval Note:  11/19/2012 10:54 AM  Oren Bracket  has presented today for surgery, with the diagnosis of GI bleed  The various methods of treatment have been discussed with the patient and family. After consideration of risks, benefits and other options for treatment, the patient has consented to  Procedure(s) (LRB) with comments: COLONOSCOPY (N/A) as a surgical intervention .  The patient's history has been reviewed, patient examined, no change in status, stable for surgery.  I have reviewed the patient's chart and labs.  Questions were answered to the patient's satisfaction.     Rawson Minix JR,Chukwudi Ewen L

## 2012-11-19 NOTE — Op Note (Signed)
[facility] [address] [place], [address]   COLONOSCOPY PROCEDURE REPORT  PATIENT: [name], [name]  MR#: [id number] BIRTHDATE: 04-20-1966 , 46  yrs. old GENDER: Female ENDOSCOPIST: Carman Ching, MD REFERRED BY:   Hospitalist PROCEDURE DATE:  11/19/2012 PROCEDURE:     colonoscopy and biopsy ASA CLASS:   1 INDICATIONS:  Abdominal pain and rectal bleeding MEDICATIONS:  Fentanyl 125 mcg, versed 12.5 mg  DESCRIPTION OF PROCEDURE:  Pentax peds scope advanced to cecum. Cecum, AC, TC and prox DC normal. Transition zone distal DC ischemic changes to mid [place] multiple ulcers multiple biopsies.     COMPLICATIONS: None  ENDOSCOPIC IMPRESSION: Ischemic colitis of left colon  RECOMMENDATIONS: Advance diet slowly, cliical support    _______________________________ Rosalie DoctorCarman Ching, MD 11/19/2012 11:45 AM     PATIENT NAME:  [name], [name] MR#: [id number]

## 2012-11-19 NOTE — Plan of Care (Signed)
Problem: Phase III Progression Outcomes Goal: Pain controlled on oral analgesia No compaint of pain

## 2012-11-20 ENCOUNTER — Encounter (HOSPITAL_COMMUNITY): Payer: Self-pay | Admitting: Gastroenterology

## 2012-11-20 DIAGNOSIS — K559 Vascular disorder of intestine, unspecified: Secondary | ICD-10-CM | POA: Diagnosis present

## 2012-11-20 DIAGNOSIS — D62 Acute posthemorrhagic anemia: Secondary | ICD-10-CM

## 2012-11-20 LAB — CBC
Hemoglobin: 11.8 g/dL — ABNORMAL LOW (ref 12.0–15.0)
MCHC: 33.2 g/dL (ref 30.0–36.0)
RDW: 12.7 % (ref 11.5–15.5)
WBC: 7.5 10*3/uL (ref 4.0–10.5)

## 2012-11-20 MED ORDER — PRAVASTATIN SODIUM 80 MG PO TABS: 80.0000 mg | ORAL_TABLET | Freq: Every day | ORAL | Status: DC

## 2012-11-20 MED ORDER — INFLUENZA VIRUS VACC SPLIT PF IM SUSP
0.5000 mL | Freq: Once | INTRAMUSCULAR | Status: AC
  Administered 2012-11-20: 0.5 mL via INTRAMUSCULAR
  Filled 2012-11-20: qty 0.5

## 2012-11-20 MED ORDER — SIMVASTATIN 40 MG PO TABS
40.0000 mg | ORAL_TABLET | Freq: Every day | ORAL | Status: DC
  Filled 2012-11-20: qty 1

## 2012-11-20 NOTE — Progress Notes (Signed)
Subjective: Ms. Hiraldo was seen and examined at bedside.  She is s/p colonoscopy which revealed ischemic colitis.  She claims to feel well and wants to go home.  She has no major complaints at this time.  She tolerated regular diet well.  No more blood BMs and no abdominal pain.   She denies any fever, chills, N/V/D, chest pain, shortness of breath at this time.    Objective: Vital signs in last 24 hours: Filed Vitals:   11/19/12 1210 11/19/12 1339 11/19/12 2111 11/20/12 0652  BP: 167/118 147/89 159/97 159/93  Pulse:  92 97 91  Temp:  97.9 F (36.6 C) 98.1 F (36.7 C) 97.8 F (36.6 C)  TempSrc:  Oral Oral Oral  Resp: 15 20 14 14   Height:      Weight:      SpO2: 98% 97% 99% 95%   Weight change:   Intake/Output Summary (Last 24 hours) at 11/20/12 0718 Last data filed at 11/19/12 2216  Gross per 24 hour  Intake   1126 ml  Output      0 ml  Net   1126 ml   Vitals reviewed. General: resting in bed, NAD HEENT: PERRLA, EOMI, no scleral icterus Cardiac: RRR, no rubs, murmurs or gallops Pulm: clear to auscultation bilaterally, no wheezes, rales, or rhonchi Abd: soft, nontender, nondistended, BS present Ext: warm and well perfused, no pedal edema Neuro: alert and oriented X3, cranial nerves II-XII grossly intact, strength and sensation to light touch equal in bilateral upper and lower extremities  Lab Results: Basic Metabolic Panel:  Lab 11/17/12 9604 11/16/12 0844  NA 138 136  K 3.6 3.9  CL 104 102  CO2 24 20  GLUCOSE 90 116*  BUN 6 9  CREATININE 0.81 0.74  CALCIUM 8.5 9.6  MG -- --  PHOS -- --   Liver Function Tests:  Lab 11/17/12 1632 11/16/12 0844  AST 14 19  ALT 11 12  ALKPHOS 53 59  BILITOT 0.3 0.5  PROT 6.5 7.6  ALBUMIN 3.1* 3.8    Lab 11/17/12 1632 11/16/12 0844  LIPASE 15 17  AMYLASE -- --   CBC:  Lab 11/19/12 0530 11/18/12 2208 11/17/12 1632 11/16/12 0844  WBC 8.5 10.5 -- --  NEUTROABS -- -- 7.4 14.1*  HGB 10.9* 11.2* -- --  HCT 32.5* 33.2* --  --  MCV 89.8 90.0 -- --  PLT 183 198 -- --   Cardiac Enzymes:  Lab 11/17/12 1632  CKTOTAL --  CKMB --  CKMBINDEX --  TROPONINI <0.30   Coagulation:  Lab 11/17/12 1632  LABPROT 13.1  INR 1.00   Urinalysis:  Lab 11/17/12 1713 11/16/12 1120  COLORURINE YELLOW YELLOW  LABSPEC 1.031* 1.025  PHURINE 5.5 5.5  GLUCOSEU NEG NEGATIVE  HGBUR SMALL* TRACE*  BILIRUBINUR SMALL* NEGATIVE  KETONESUR NEG >80*  PROTEINUR NEG NEGATIVE  UROBILINOGEN 1 0.2  NITRITE NEG NEGATIVE  LEUKOCYTESUR NEG NEGATIVE   Micro Results: Recent Results (from the past 240 hour(s))  OVA AND PARASITE EXAMINATION     Status: Normal   Collection Time   11/18/12 12:11 AM      Component Value Range Status Comment   Specimen Description STOOL   Final    Special Requests NONE   Final    Ova and parasites NO OVA OR PARASITES SEEN ABUNDANT WBC SEEN   Final    Report Status 11/19/2012 FINAL   Final   STOOL CULTURE     Status: Normal (Preliminary result)  Collection Time   11/18/12 12:12 AM      Component Value Range Status Comment   Specimen Description STOOL   Final    Special Requests NONE   Final    Culture NO SUSPICIOUS COLONIES, CONTINUING TO HOLD   Final    Report Status PENDING   Incomplete   GRAM STAIN     Status: Normal   Collection Time   11/18/12 12:12 AM      Component Value Range Status Comment   Specimen Description STOOL   Final    Special Requests Normal   Final    Gram Stain     Final    Value: RARE WBC PRESENT, PREDOMINANTLY PMN     NO SQUAMOUS EPITHELIAL CELLS SEEN     MODERATE GRAM POSITIVE RODS     MODERATE GRAM NEGATIVE RODS     MODERATE GRAM POSITIVE COCCI   Report Status 11/18/2012 FINAL   Final   CLOSTRIDIUM DIFFICILE BY PCR     Status: Normal   Collection Time   11/18/12 12:12 AM      Component Value Range Status Comment   C difficile by pcr NEGATIVE  NEGATIVE Final    Medications: I have reviewed the patient's current medications. Scheduled Meds:    . antiseptic oral  rinse  15 mL Mouth Rinse q12n4p  . chlorhexidine  15 mL Mouth Rinse BID  . [EXPIRED] influenza  inactive virus vaccine  0.5 mL Intramuscular Tomorrow-1000  . influenza  inactive virus vaccine  0.5 mL Intramuscular Once  . norgestimate-ethinyl estradiol  1 tablet Oral Daily  . pantoprazole (PROTONIX) IV  40 mg Intravenous Q12H   Continuous Infusions:    . sodium chloride 75 mL/hr at 11/18/12 1427  . [DISCONTINUED] sodium chloride 500 mL (11/19/12 1001)   PRN Meds:.albuterol, ondansetron, [DISCONTINUED] fentaNYL, [DISCONTINUED] midazolam Assessment/Plan: Ms. Wyble is a 46 year female with PMH COPD and HTN admitted for nausea/vomiting, abdominal pain and GIB.   Ischemic colitis: presented with BRBPR associated with nausea, vomiting, and crampy abdominal pain. No more bloody BMs and improved abdominal pain.  Hb on admission: 12.7, afebrile, wbc on admission: 10.5 (improved from 16.7 on 11/16/12). INR 1. Trop x1 negative.-cbc q8H, consider transfusion if Hb <7.  Hb today 10.9.  Significant heart disease and strokes in family.  Framingham risk score: 3%.   -[facility] following--Dr. Randa Evens.  Colonoscopy yesterday revealed ischemic colitis of left colon--Transition zone distal DC ischemic changes to mid [place] multiple ulcers multiple biopsies pending. -Advance diet slowly.  Discussed with Dr. Randa Evens no need for antibiotics or surgical intervention at this time.  Supportive therapy.     -stool studies: ova parasites (none), cdiff negative, lactoferrin, gram stain: moderate gram positive rods, gram negative rods, and moderate gram positive cocci, culture (prelim no suspicious colonies) -IV Protonix BID  -IVF  -continue to monitor  -f/u Echocardiogram--possible structural abnormality.  Significant heart disease in family -lipid panel Jan 2013: Chol 230, TG: 369, LDL:120, HDL:36, VLDL:74--start statin, pravastatin 80mg  daily  #Nausea/Vomiting: resolved.   -Zofran PRN  -tolerating liquid diet, advance  as tolerated  #HTN: BP on admission: 129/77. On home regimen: Lisinopril 20mg   -hold home med for now  -continue to monitor   #COPD: on albuterol inhaler PRN at home. Obese female. No PFTs on chart review.  -continue albuterol  -continue to monitor   #GERD: on nexium at home  -protonix  -continue to monitor   Diet: heart healthy DVT Ppx: SCDs  Dispo:  Disposition is deferred at this time, awaiting improvement of current medical problems. Anticipated discharge in approximately 1-2 day(s).  The patient does have a current PCP Virgina Organ, Lindalou Hose, MD), therefore will be requiring [facility]PC follow-up after discharge.  The patient does not have transportation limitations that hinder transportation to clinic appointments.   LOS: 3 days   Darden Palmer 11/20/2012, 7:18 AM

## 2012-11-20 NOTE — Care Management Note (Signed)
    Page 1 of 1   11/20/2012     4:11:54 PM   CARE MANAGEMENT NOTE 11/20/2012  Patient:  [name], [name]   Account Number:  [id number]  Date Initiated:  11/20/2012  Documentation initiated by:  Letha Cape  Subjective/Objective Assessment:   dx isc colitis  admit as observation     Action/Plan:   Anticipated DC Date:  11/20/2012   Anticipated DC Plan:  HOME/SELF CARE      DC Planning Services  CM consult      Choice offered to / List presented to:             Status of service:  Completed, signed off Medicare Important Message given?   (If response is "NO", the following Medicare IM given date fields will be blank) Date Medicare IM given:   Date Additional Medicare IM given:    Discharge Disposition:  HOME/SELF CARE  Per UR Regulation:  Reviewed for med. necessity/level of care/duration of stay  If discussed at Long Length of Stay Meetings, dates discussed:    Comments:  11/20/12 16:09 Letha Cape RN, BSN [phone] patient is independent pta, patient has follow up with [facility] and she is working with Merian Capron for the orange card.  MD will give patient script for statin which is on the $4 list at [facility] and she states she has her other meds at home, will not need any ast with medications,  patient states she has transportation.

## 2012-11-20 NOTE — Progress Notes (Signed)
Internal Medicine Teaching Service Attending Note Date: 11/20/2012  Patient name: [name]  Medical record number: [id number]  Date of birth: [birth year]    This patient has been seen and discussed with the house staff. Please see their note for complete details. I concur with their findings with the following additions/corrections: Patient will be discharged home today with followup with gastroenterology and internal medicine. Patient has an unusual diagnosis of ischemic colitis for her age. Focus will be to reduce atherosclerosis to prevent future such episodes. A 2-D echocardiogram was performed today to rule out structural heart abnormalities as a cause of embolism.  Lars Mage 11/20/2012, 3:12 PM

## 2012-11-20 NOTE — Progress Notes (Signed)
  Echocardiogram 2D Echocardiogram has been performed.  Deontaye Civello 11/20/2012, 11:05 AM

## 2012-11-20 NOTE — Progress Notes (Signed)
[facility] PROGRESS NOTE Subjective Pt tolerating diet w/o pain.  Objective: Vital signs in last 24 hours: Temp:  [97.8 F (36.6 C)-98.3 F (36.8 C)] 97.8 F (36.6 C) (12/05 0652) Pulse Rate:  [91-97] 91  (12/05 0652) Resp:  [12-23] 14  (12/05 0652) BP: (103-167)/(63-118) 159/93 mmHg (12/05 0652) SpO2:  [93 %-100 %] 95 % (12/05 0652) Last BM Date: 11/19/12  Intake/Output from previous day: 12/04 0701 - 12/05 0700 In: 1126 [P.O.:716; I.V.:400; IV Piggyback:10] Out: -  Intake/Output this shift:    PE: Abd-soft and nontender.  Lab Results:  Basename 11/20/12 0631 11/19/12 0530 11/18/12 2208 11/18/12 1318 11/18/12 0515  WBC 7.5 8.5 10.5 7.4 8.2  HGB 11.8* 10.9* 11.2* 11.1* 10.5*  HCT 35.5* 32.5* 33.2* 33.9* 31.7*  PLT 241 183 198 198 191   BMET  Basename 11/17/12 1632  NA 138  K 3.6  CL 104  CO2 24  CREATININE 0.81   LFT  Basename 11/17/12 1632  PROT 6.5  AST 14  ALT 11  ALKPHOS 53  BILITOT 0.3  BILIDIR --  IBILI --   PT/INR  Basename 11/17/12 1632  LABPROT 13.1  INR 1.00   PANCREAS  Basename 11/17/12 1632  LIPASE 15         Studies/Results: No results found.  Medications: I have reviewed the patient's current medications.  Assessment/Plan: 1. Probable Ischemic Colitis. Path still pending. OK to discharge home Attention to diet, cholesterol BP etc. Discussed with pt needs routine colon cancer screening colon in 10 years.   Chance Munter JR,Mikias Lanz L 11/20/2012, 9:24 AM

## 2012-11-20 NOTE — Discharge Summary (Signed)
Internal Medicine Teaching [facility] Discharge Note  Name: [name] MRN: [id number] DOB: Aug 08, 1966 46 y.o.  Date of Admission: 11/17/2012  5:45 PM Date of Discharge: 11/20/2012 Attending Physician: Lars Mage, MD  Discharge Diagnosis: Principal Problem:  *Ischemic colitis Active Problems:  HTN (hypertension)  Acute blood loss anemia  GERD (gastroesophageal reflux disease)  Discharge Medications:   Medication List     As of 11/20/2012  4:59 PM    TAKE these medications         albuterol 108 (90 BASE) MCG/ACT inhaler   Commonly known as: PROVENTIL HFA;VENTOLIN HFA   Inhale 2 puffs into the lungs every 6 (six) hours as needed. For SOB      albuterol (2.5 MG/3ML) 0.083% nebulizer solution   Commonly known as: PROVENTIL   Take 3 mLs (2.5 mg total) by nebulization every 6 (six) hours as needed for wheezing.      diphenhydrAMINE 25 mg capsule   Commonly known as: BENADRYL   Take 50-75 mg by mouth at bedtime as needed. For sleep      esomeprazole 40 MG capsule   Commonly known as: NEXIUM   Take 40 mg by mouth daily before breakfast.      lisinopril 20 MG tablet   Commonly known as: PRINIVIL,ZESTRIL   Take 1 tablet (20 mg total) by mouth daily.      multivitamin tablet   Take 1 tablet by mouth daily.      norgestimate-ethinyl estradiol 0.25-35 MG-MCG tablet   Commonly known as: ORTHO-CYCLEN,SPRINTEC,PREVIFEM   Take 1 tablet by mouth daily.      pravastatin 80 MG tablet   Commonly known as: PRAVACHOL   Take 1 tablet (80 mg total) by mouth daily.      promethazine 25 MG tablet   Commonly known as: PHENERGAN   Take 1 tablet (25 mg total) by mouth every 6 (six) hours as needed for nausea.        Disposition and follow-up:   [name] was discharged from [facility] in stable condition.  At the hospital follow up visit please address: Ischemic colitis--found on colonoscopy.  Status of bleeding, hopefully should be resolved.  Risk  factor modification.   HTN: continue on home medication Lisinopril.  Has monitor at home, instructed to check and keep records.  Improve diet and exercise.   Anemia: presented with BRBPR.  Resolved during hospital course.  Hb on discharge: 11.8.  Consider repeat cbc.    Follow-up Appointments:     Follow-up Information    Follow up with Kristie Cowman, MD. On 12/04/2012. (@145pm )    Contact information:   [address] [place] [address] [phone]       Call EDWARDS Burna Mortimer, MD.   Contact information:   [address]                         [place] [address] [phone]         Discharge Orders    Future Appointments: Provider: Department: Dept Phone: Center:   12/04/2012 1:45 PM Manuela Schwartz, MD [facility] [phone] [facility]     Future Orders Please Complete By Expires   Diet - low sodium heart healthy      Increase activity slowly      Call MD for:  persistant nausea and vomiting      Call  MD for:  severe uncontrolled pain      Call MD for:  temperature >100.4      Call MD for:  extreme fatigue      Call MD for:  persistant dizziness or light-headedness        Consultations: Treatment Team:  Petra Kuba, MD  Procedures Performed:  Eligha Bridegroom [facility]  [address] [place], [address]  COLONOSCOPY PROCEDURE REPORT  PATIENT: Rachel, Leon MR#: [id number]  BIRTHDATE: 08-12-1966 , 46 yrs. old GENDER: Female  ENDOSCOPIST: Carman Ching, MD  REFERRED BY: Hospitalist  PROCEDURE DATE: 11/19/2012  PROCEDURE: colonoscopy and biopsy  ASA CLASS: 1  INDICATIONS: Abdominal pain and rectal bleeding  MEDICATIONS: Fentanyl 125 mcg, versed 12.5 mg  DESCRIPTION OF PROCEDURE: Pentax peds scope advanced to cecum.  Cecum, AC, TC and prox DC normal. Transition zone distal DC  ischemic changes to mid [place] multiple ulcers multiple biopsies.  COMPLICATIONS:None  ENDOSCOPIC  IMPRESSION: Ischemic colitis of left colon  RECOMMENDATIONS:  Advance diet slowly, cliical support  _______________________________  Rosalie DoctorCarman Ching, MD 11/19/2012 11:45 AM  PATIENT NAME: Rachel, Leon  MR#: [id number]   2D Echo:  [facility]* *[facility]* [address] [place], [place] [address] [phone]  ------------------------------------------------------------ Transthoracic Echocardiography  Patient: Rachel, Leon MR #: [id number] Study Date: 11/20/2012 Gender: F Age: 80 Height: 157.5cm Weight: 102.8kg BSA: 2.46m^2 Pt. Status: Room: 5529  PERFORMING W. Viann Fish, MD [facility] SONOGRAPHER Cathie Beams Margie Billet, Ankit REFERRING Eben Burow, Ankit cc:  ------------------------------------------------------------ LV EF: 60% - 65%  ------------------------------------------------------------ History: PMH: asthma. reflux. R/O structural abornomality Acute blood loss anemia. Obesity. Reflux. Asthma. Rule out structural abnormality. Risk factors: Hypertension.  ------------------------------------------------------------ Study Conclusions  Left ventricle: The cavity size was normal. Systolic function was normal. The estimated ejection fraction was in the range of 60% to 65%. Wall motion was normal; there were no regional wall motion abnormalities. Doppler parameters are consistent with abnormal left ventricular relaxation (grade 1 diastolic dysfunction). Transthoracic echocardiography. M-mode, complete 2D, spectral Doppler, and color Doppler. Height: Height: 157.5cm. Height: 62in. Weight: Weight: 102.8kg. Weight: 226.2lb. Body mass index: BMI: 41.5kg/m^2. Body surface area: BSA: 2.91m^2. Blood pressure: 159/93. Patient status: Inpatient. Location: Echo laboratory.  ------------------------------------------------------------  ------------------------------------------------------------ Left ventricle: The cavity  size was normal. Systolic function was normal. The estimated ejection fraction was in the range of 60% to 65%. Wall motion was normal; there were no regional wall motion abnormalities. Doppler parameters are consistent with abnormal left ventricular relaxation (grade 1 diastolic dysfunction).  ------------------------------------------------------------ Aortic valve: Not well visualized. The valve appears to be grossly normal. Normal thickness leaflets. Mobility was not restricted. Doppler: Transvalvular velocity was within the normal range. There was no stenosis. No regurgitation.  ------------------------------------------------------------ Aorta: Aortic root: The aortic root was normal in size.  ------------------------------------------------------------ Mitral valve: Structurally normal valve. Mobility was not restricted. Doppler: Transvalvular velocity was within the normal range. There was no evidence for stenosis. No regurgitation. Peak gradient: 2mm Hg (D).  ------------------------------------------------------------ Left atrium: The atrium was normal in size.  ------------------------------------------------------------ Right ventricle: The cavity size was normal. Wall thickness was normal. Systolic function was normal.  ------------------------------------------------------------ Pulmonic valve: The valve appears to be grossly normal. Doppler: Transvalvular velocity was within the normal range. There was no evidence for stenosis. Trivial regurgitation.  ------------------------------------------------------------ Tricuspid valve: Structurally normal valve. Doppler: Transvalvular velocity was within the normal range. No regurgitation.  ------------------------------------------------------------ Right atrium: The atrium was normal in size.  ------------------------------------------------------------  Pericardium: There was no pericardial  effusion.  ------------------------------------------------------------ Systemic veins: Inferior vena cava: The vessel was normal in size.  ------------------------------------------------------------  2D measurements Normal Doppler measurements Normal Left ventricle Left ventricle LVID ED, 35.7 mm 43-52 Ea, lat ann, 11. cm/s ------ chord, tiss DP 6 PLAX E/Ea, lat 6.7 ------ LVID ES, 25.2 mm 23-38 ann, tiss DP 2 chord, Ea, med ann, 9.0 cm/s ------ PLAX tiss DP 7 FS, chord, 29 % >29 E/Ea, med 8.6 ------ PLAX ann, tiss DP LVPW, ED 11.4 mm ------ Mitral valve IVS/LVPW 0.78 <1.3 Peak E vel 78 cm/s ------ ratio, ED Peak A vel 108 cm/s ------ Ventricular septum Deceleration 232 ms 150-23 IVS, ED 8.84 mm ------ time 0 Aorta Peak 2 mm ------ Root diam, 29 mm ------ gradient, D Hg ED Peak E/A 0.7 ------ Left atrium ratio AP dim 25 mm ------ Right ventricle AP dim 1.15 cm/m^2 <2.2 Sa vel, lat 11. cm/s ------ index ann, tiss DP 9  ------------------------------------------------------------ Prepared and Electronically Authenticated by  Ellwood Handler 2013-12-05T11:56:42.013      Admission HPI: Ms. Morioka is a 46 year old white female with PMH COPD and HTN who was seen in [facility] this afternoon for ED follow up. She was recently evaluated in the ED on 11/16/2012 for acute onset nausea and vomiting x1 day with nonbloody vomiting x 10 episodes and associated with lower quadrant campy abdominal pain. She claims that she woke up at 4am on 11/16/12 feeling unwell, with nausea, and started vomiting. Around 9am she came to the ED for her complaints which was thought to be likely secondary to viral gastroenteritis and was subsequently treated with phenergan that stabilized her nausea. She returned home only to have acute onset of watery stools with large amounts of bright red blood x 4 times. She claims the blood initially looked rusty red then turned bright red with subsequent BMs. She also endorses  generalized weakness, cold sweats with vomiting, occasional lightheadedness worse with movement (claims to have long hx of vertigo), and persistent abdominal cramping at its worst 8-10/10, localized mainly to suprapubic region and tenderness to palpation of LLQ. Ms. Mcclish explains that for the past two months she has had poor appetite eating very small amounts but denies any weight loss however notes bloating. She takes nexium for heart burn and says it was exacerbated with coffee intake. She currently denies any fever, chills, chest pain, shortness of breath, urinary complaints, diarrhea or constipation.  Of note, Ms. Pietila denies any similar episodes in the past. She has no prior history of PUD, no history of intense NSAID use, and no personal or family history of colon cancer. Her Last menstrual cycle was 3 weeks ago, she has had tubal ligation, and is currently on birth control pills for irregular periods. Hx of heavy and prolonged periods for several years. No obgyn follow-up. Until last year, she had not seen a primary care physician for 19 years. She denies smoking (quit ~5 years ago), drinking alcohol, or any illicit drug use.   Hospital Course by problem list:  Ischemic colitis--presented with crampy abdominal pain, nausea, vomiting, and BRBPR.  Symptoms resolved during hospital course.  GI was consulted.  Colonoscopy done by Dr. Randa Evens revealed ischemic colitis.  Echocardiogram did not show any structural abnormalities.  Multiple biopsies taken and results pending.  Supportive therapy with fluids and slow advancement of diet was initiated which she tolerated well.  Will follow up with GI and PCP as an outpatient--risk factor modification advised.  Cholesterol, diet, blood pressure control and weight loss.  Started on Pravastatin this hospital course.       HTN (hypertension)--hx of hypertension.  On Lisinopril at home and to be resumed at discharge.  Will need to follow up with pcp and adjust  medication as needed.  Advised on weight loss, exercise, diet modifications.  Regularly checking blood pressure at home as well.     Acute blood loss anemia--presented with BRBPR.  Resolved during hospital course.  Hb on day of admission 11.5, went as low as 10.9 and increased/stablized during hospital course, no transfusions given.  Hb on discharge 11.8.  Follow up with pcp.  Hx of heavy menses.  On birth control pills.     GERD (gastroesophageal reflux disease)--takes Nexium at home.  Was on protonix during hospital course.  Resume nexium on discharge.  Follow up with pcp as needed.    Discharge Vitals:  BP 180/100  Pulse 95  Temp 98 F (36.7 C) (Oral)  Resp 20  Ht 5\' 2"  (1.575 m)  Wt 226 lb 10.1 oz (102.8 kg)  BMI 41.45 kg/m2  SpO2 99%  LMP 10/28/2012  Discharge Labs:  Results for orders placed during the hospital encounter of 11/17/12 (from the past 24 hour(s))  CBC     Status: Abnormal   Collection Time   11/20/12  6:31 AM      Component Value Range   WBC 7.5  4.0 - 10.5 K/uL   RBC 4.01  3.87 - 5.11 MIL/uL   Hemoglobin 11.8 (*) 12.0 - 15.0 g/dL   HCT 16.1 (*) 09.6 - 04.5 %   MCV 88.5  78.0 - 100.0 fL   MCH 29.4  26.0 - 34.0 pg   MCHC 33.2  30.0 - 36.0 g/dL   RDW 40.9  81.1 - 91.4 %   Platelets 241  150 - 400 K/uL   Signed: Darden Palmer 11/20/2012, 4:28 PM   Time Spent on Discharge: 1 hour Services Ordered on Discharge: no Equipment Ordered on Discharge: none

## 2012-11-20 NOTE — Progress Notes (Signed)
Pt. discharged to floor,verbalized understanding of discharged instruction,medication,restriction,diet and follow up appointment.Baseline Vitals sign stable,Pt comfortable,no sign and symptom of distress. 

## 2012-11-21 ENCOUNTER — Telehealth: Payer: Self-pay | Admitting: Licensed Clinical Social Worker

## 2012-11-21 LAB — STOOL CULTURE

## 2012-11-21 NOTE — Telephone Encounter (Signed)
Discharge date: 11/20/2012  Hospital follow up appointment date: 12/04/2012   Calling to assist with transition of care from hospital to home.  Discharge medications reviewed: Yes. Pt states she called [facility] and the "statin" was never received/not called in.  CSW informed Ms. Pucci will notify triage RN.  Pt utilizes [facility] at [facility] on [address].  Able to fill all prescriptions? Has all except for "statin" which was supposed to be called in.  Patient aware of hospital follow up appointments.   No problems with transportation.  Other problems/concerns:

## 2012-11-21 NOTE — Telephone Encounter (Signed)
[facility] pharmacy called - Pravachol rx has been received and they will have it ready today. Pt called - no answer; message left.

## 2012-11-25 ENCOUNTER — Telehealth: Payer: Self-pay | Admitting: Licensed Clinical Social Worker

## 2012-11-25 ENCOUNTER — Other Ambulatory Visit: Payer: Self-pay | Admitting: *Deleted

## 2012-11-25 ENCOUNTER — Other Ambulatory Visit: Payer: Self-pay | Admitting: Internal Medicine

## 2012-11-25 MED ORDER — PRAVASTATIN SODIUM 40 MG PO TABS: 80.0000 mg | ORAL_TABLET | Freq: Every day | ORAL | Status: DC

## 2012-11-25 NOTE — Telephone Encounter (Signed)
Refill request sent to Dr Virgina Organ to change mg so cost of med will be $8.

## 2012-11-25 NOTE — Telephone Encounter (Signed)
CSW received message from Ms. Ventresca stating the medication that was called into [facility] was over $50 and pt was in need of a $4 med.  CSW returned call to Ms. Freiman, left message providing pt with main phone number to [facility] to reach RN for medication.  Hopeful, pt will not have to wait for CSW to return call in the future.  Left message indicating CSW will forward to triage RN to address medication concern.

## 2012-11-25 NOTE — Telephone Encounter (Signed)
Pravastatin 80 mg cost #50 at [facility].  If you change to 40 mg tablets # 60 it will only cost $8. Can you please change?

## 2012-12-04 ENCOUNTER — Ambulatory Visit (INDEPENDENT_AMBULATORY_CARE_PROVIDER_SITE_OTHER): Payer: Self-pay | Admitting: Internal Medicine

## 2012-12-04 ENCOUNTER — Encounter: Payer: Self-pay | Admitting: Internal Medicine

## 2012-12-04 VITALS — BP 155/107 | HR 96 | Temp 97.4°F | Wt 235.9 lb

## 2012-12-04 DIAGNOSIS — N920 Excessive and frequent menstruation with regular cycle: Secondary | ICD-10-CM

## 2012-12-04 DIAGNOSIS — I10 Essential (primary) hypertension: Secondary | ICD-10-CM

## 2012-12-04 DIAGNOSIS — D62 Acute posthemorrhagic anemia: Secondary | ICD-10-CM

## 2012-12-04 DIAGNOSIS — K559 Vascular disorder of intestine, unspecified: Secondary | ICD-10-CM

## 2012-12-04 LAB — CBC
Hemoglobin: 12 g/dL (ref 12.0–15.0)
MCH: 29.9 pg (ref 26.0–34.0)
MCHC: 34.1 g/dL (ref 30.0–36.0)
MCV: 87.8 fL (ref 78.0–100.0)
Platelets: 265 10*3/uL (ref 150–400)
RBC: 4.01 MIL/uL (ref 3.87–5.11)

## 2012-12-04 MED ORDER — NORGESTIMATE-ETH ESTRADIOL 0.25-35 MG-MCG PO TABS: 1.0000 | ORAL_TABLET | Freq: Every day | ORAL | Status: DC

## 2012-12-04 MED ORDER — HYDROCHLOROTHIAZIDE 12.5 MG PO CAPS: 12.5000 mg | ORAL_CAPSULE | Freq: Every day | ORAL | Status: DC

## 2012-12-04 NOTE — Assessment & Plan Note (Signed)
Currently on menses, pt reports that OCPs were stopped on admission to hospital which resulted in break through bleeding now full menstrual flow.  -refilled OCPs today.

## 2012-12-04 NOTE — Assessment & Plan Note (Signed)
BP Readings from Last 3 Encounters:  12/04/12 155/107  11/20/12 180/100  11/20/12 180/100    Lab Results  Component Value Date   NA 138 11/17/2012   K 3.6 11/17/2012   CREATININE 0.81 11/17/2012    Assessment:  Blood pressure control: moderately elevated  Progress toward BP goal:  deteriorated  Comments:   Plan:  Medications:  continue current medications, will add thiazide to ACEi  Educational resources provided: brochure  Self management tools provided: home blood pressure logbook  Other plans: start HCTZ 12.5 mg qd, cont lisinopril 20 mg qd

## 2012-12-04 NOTE — Progress Notes (Signed)
  Subjective:    Patient ID: [name], female    DOB: 03-03-1966, 46 y.o.   MRN: [id number]  HPI  Pt presents for hosp-f/u of ischemic colitis of left colon, with acute blood loss anemia (~2 gm) and hypertension. Treated with conservative therapy of slow advancement of diet. States that symptoms of abdominal pain, nausea, vomiting and blood per rectum has resolved.  Has many questions concerning ischemic colitis. She is currently on her menstrual cycle and reports 'menstrual cramps'.  Review of Systems As per HPI otherwise negative    Objective:   Physical Exam  Constitutional: She is oriented to person, place, and time. She appears well-developed and well-nourished.       Obese, pleasant  HENT:  Head: Normocephalic and atraumatic.  Cardiovascular: Normal rate, regular rhythm, normal heart sounds and intact distal pulses.   No murmur heard. Pulmonary/Chest: Effort normal and breath sounds normal.  Abdominal: Soft. Bowel sounds are normal. There is no tenderness.       obese  Musculoskeletal: Normal range of motion. She exhibits no edema and no tenderness.  Neurological: She is alert and oriented to person, place, and time.  Skin: Skin is warm and dry.  Psychiatric: She has a normal mood and affect. Her behavior is normal. Judgment and thought content normal.          Assessment & Plan:  1. Ischemic colitis: resolved, etiology unclear -cont to monitor for recurrence of symptoms  2. Acute blood loss anemia: stable at discharge from hospital with hgb 11.8 -check CBC with knowledge that pt is currently on menses  3. Hypertension: above goal today on lisinopril 20 mg, as been elevated on multiple previous visits -will add HCTZ 12.5 mg with plan for likely combination pill if possible  4. Menorrhagia: improved on OCPs, s/p tubal ligation -refilled OCPs -CBC today

## 2012-12-04 NOTE — Patient Instructions (Addendum)
General Instructions: We have added a new medication for your blood pressure. Pick it up from your pharmacy ad take as prescribed. We have refilled the contraception pills. For your colitis (inflammation of the intestines/gut), there is no need for further follow-up unless you develop symptoms again. We have attached information to this hand-out. Follow-up with your PCP in 2 weeks for blood pressure re-check.  Treatment Goals:  Goals (1 Years of Data) as of 12/04/2012          As of Today As of Today 11/20/12 11/20/12 11/20/12     Blood Pressure    . Blood Pressure < 140/90  155/107 168/105 180/100 157/110 159/93      Progress Toward Treatment Goals:  Treatment Goal 12/04/2012  Blood pressure deteriorated    Self Care Goals & Plans:  Self Care Goal 12/04/2012  Manage my medications take my medicines as prescribed; bring my medications to every visit; refill my medications on time  Monitor my health keep track of my blood glucose  Eat healthy foods eat more vegetables; eat foods that are low in salt; drink diet soda or water instead of juice or soda  Be physically active take a walk every day; park at the far end of the parking lot       Care Management & Community Referrals:  Meet with financial counselor today, concerning [employer].    Acute Mesenteric Ischemia Mesentery refers to the tissues that connect the blood vessels to the intestines. Ischemia refers to a restriction in blood supply. Mesenteric ischemia happens when an artery or vein that supports the intestine becomes blocked or narrow. Acute mesenteric ischemia (AMI) happens suddenly and can be life-threatening. CAUSES  When blood supply to the intestine is severely restricted, needed oxygen cannot reach the intestines for proper function. Causes of AMI include:  A blood clot. This may develop due to heart attack, heart failure, or an irregular heartbeat (arrhythmia).  Low blood pressure. This may be due to  shock, heart failure, certain medicines, dialysis, or kidney failure. People at the greatest risk for mesenteric ischemia are those over the age of 89 with a history of coronary or vascular disease and people who smoke. SYMPTOMS   Sudden, severe abdominal pain or bloating.  Blood in the stool.  Nausea.  Diarrhea, which is often bloody.  Vomiting.  Fever. DIAGNOSIS  AMI is a medical emergency. Immediate evaluation and treatment is necessary. To confirm a diagnosis of AMI, your caregiver may perform:  A history and physical exam.  X-rays or CT scans.  Blood tests.  Angiography. This imaging test uses a dye to obtain a picture of blood flow to the intestine.  Exploratory laparotomy. This is surgery that opens the abdomen and examines the intestines for signs of tissue death. Dead intestines will need to be removed. TREATMENT  Treatment of AMI almost always means emergency surgery. Patients who need this treatment are very sick and will need to be in the intensive care unit (ICU) after surgery. This is a life-threatening condition. Document Released: 07/23/2011 Document Revised: 02/25/2012 Document Reviewed: 07/23/2011 [facility] Patient Information 2013 [place], [place].

## 2013-03-09 ENCOUNTER — Encounter: Payer: Self-pay | Admitting: Internal Medicine

## 2013-04-23 ENCOUNTER — Other Ambulatory Visit: Payer: Self-pay | Admitting: *Deleted

## 2013-04-23 DIAGNOSIS — I10 Essential (primary) hypertension: Secondary | ICD-10-CM

## 2013-04-23 MED ORDER — LISINOPRIL 20 MG PO TABS: 20.0000 mg | ORAL_TABLET | Freq: Every day | ORAL | Status: DC

## 2013-04-23 NOTE — Telephone Encounter (Signed)
Message sent to front desk to schedule pt an appt. 

## 2013-04-23 NOTE — Telephone Encounter (Signed)
Dear Rachel Barbara,  Ms. Ramanathan has not been seen since December with dr. Bosie Clos, is she due for a follow up? At least for HTN check i would assume.

## 2013-04-27 ENCOUNTER — Encounter: Payer: Self-pay | Admitting: Internal Medicine

## 2013-05-26 ENCOUNTER — Ambulatory Visit (INDEPENDENT_AMBULATORY_CARE_PROVIDER_SITE_OTHER): Payer: Self-pay | Admitting: Internal Medicine

## 2013-05-26 ENCOUNTER — Encounter: Payer: Self-pay | Admitting: Internal Medicine

## 2013-05-26 VITALS — BP 161/103 | HR 99 | Temp 97.0°F | Ht 63.5 in | Wt 231.2 lb

## 2013-05-26 DIAGNOSIS — K559 Vascular disorder of intestine, unspecified: Secondary | ICD-10-CM

## 2013-05-26 DIAGNOSIS — I10 Essential (primary) hypertension: Secondary | ICD-10-CM

## 2013-05-26 DIAGNOSIS — Z0000 Encounter for general adult medical examination without abnormal findings: Secondary | ICD-10-CM

## 2013-05-26 DIAGNOSIS — E785 Hyperlipidemia, unspecified: Secondary | ICD-10-CM

## 2013-05-26 DIAGNOSIS — K219 Gastro-esophageal reflux disease without esophagitis: Secondary | ICD-10-CM

## 2013-05-26 MED ORDER — LISINOPRIL-HYDROCHLOROTHIAZIDE 20-12.5 MG PO TABS: 2.0000 | ORAL_TABLET | Freq: Every day | ORAL | Status: DC

## 2013-05-26 MED ORDER — LOVASTATIN 20 MG PO TABS: 40.0000 mg | ORAL_TABLET | Freq: Every day | ORAL | Status: DC

## 2013-05-26 NOTE — Progress Notes (Signed)
Case discussed with Dr. Illath at time of visit, soon after the resident saw the patient.  We reviewed the resident's history and exam and pertinent patient test results.  I agree with the assessment, diagnosis, and plan of care documented in the resident's note. 

## 2013-05-26 NOTE — Progress Notes (Signed)
1345PM - Eye exam Right eye - 20/25 and Left eye 20/25 w/slight c/oblurriness.

## 2013-05-26 NOTE — Patient Instructions (Signed)
General Instructions: Please start taking your combination pill Lisinopril-HCTZ  For your blood pressure and return in 2-4 week for blood pressure   Please start taking your lovastatin for your cholesterol  Continue to work on weight loss   Treatment Goals:  Goals (1 Years of Data) as of 05/26/13         As of Today 12/04/12 12/04/12 11/20/12 11/20/12     Blood Pressure    . Blood Pressure < 140/90  161/103 155/107 168/105 180/100 157/110      Progress Toward Treatment Goals:  Treatment Goal 05/26/2013  Blood pressure unchanged    Self Care Goals & Plans:  Self Care Goal 05/26/2013  Manage my medications bring my medications to every visit; take my medicines as prescribed  Monitor my health keep track of my blood pressure  Eat healthy foods eat baked foods instead of fried foods; eat foods that are low in salt; eat more vegetables  Be physically active find an activity I enjoy  Meeting treatment goals maintain the current self-care plan       Care Management & Community Referrals: Hydrochlorothiazide, HCTZ; Lisinopril tablets What is this medicine? HYDROCHLOROTHIAZIDE; LISINOPRIL (hye droe klor oh THYE a zide; lyse IN oh pril) is a combination of a diuretic and an ACE inhibitor. It is used to treat high blood pressure. This medicine may be used for other purposes; ask your health care provider or pharmacist if you have questions. What should I tell my health care provider before I take this medicine? They need to know if you have any of these conditions: -bone marrow disease -decreased urine -heart or blood vessel disease -if you are on a special diet like a low salt diet -immune system problems, like lupus -kidney disease -liver disease -previous swelling of the tongue, face, or lips with difficulty breathing, difficulty swallowing, hoarseness, or tightening of the throat -recent heart attack or stroke -an unusual or allergic reaction to lisinopril,  hydrochlorothiazide, sulfa drugs, other medicines, insect venom, foods, dyes, or preservatives -pregnant or trying to get pregnant -breast-feeding How should I use this medicine? Take this medicine by mouth with a glass of water. Follow the directions on the prescription label. You can take it with or without food. If it upsets your stomach, take it with food. Take your medicine at regular intervals. Do not take it more often than directed. Do not stop taking except on your doctor's advice. Talk to your pediatrician regarding the use of this medicine in children. Special care may be needed. Overdosage: If you think you have taken too much of this medicine contact a poison control center or emergency room at once. NOTE: This medicine is only for you. Do not share this medicine with others. What if I miss a dose? If you miss a dose, take it as soon as you can. If it is almost time for your next dose, take only that dose. Do not take double or extra doses. What may interact with this medicine? -barbiturates like phenobarbital -blood pressure medicines -corticosteroids like prednisone -diabetic medications -diuretics, especially triamterene, spironolactone or amiloride -lithium -NSAIDs like ibuprofen -potassium salts or potassium supplements -prescription pain medicines -skeletal muscle relaxants like tubocurarine -some cholesterol lowering medications like cholestyramine or colestipol This list may not describe all possible interactions. Give your health care provider a list of all the medicines, herbs, non-prescription drugs, or dietary supplements you use. Also tell them if you smoke, drink alcohol, or use illegal drugs. Some items may interact  with your medicine. What should I watch for while using this medicine? Visit your doctor or health care professional for regular checks on your progress. Check your blood pressure as directed. Ask your doctor or health care professional what your blood  pressure should be and when you should contact him or her. Call your doctor or health care professional if you notice an irregular or fast heart beat. You must not get dehydrated. Ask your doctor or health care professional how much fluid you need to drink a day. Check with him or her if you get an attack of severe diarrhea, nausea and vomiting, or if you sweat a lot. The loss of too much body fluid can make it dangerous for you to take this medicine. Women should inform their doctor if they wish to become pregnant or think they might be pregnant. There is a potential for serious side effects to an unborn child. Talk to your health care professional or pharmacist for more information. You may get drowsy or dizzy. Do not drive, use machinery, or do anything that needs mental alertness until you know how this drug affects you. Do not stand or sit up quickly, especially if you are an older patient. This reduces the risk of dizzy or fainting spells. Alcohol can make you more drowsy and dizzy. Avoid alcoholic drinks. This medicine may affect your blood sugar level. If you have diabetes, check with your doctor or health care professional before changing the dose of your diabetic medicine. Avoid salt substitutes unless you are told otherwise by your doctor or health care professional. This medicine can make you more sensitive to the sun. Keep out of the sun. If you cannot avoid being in the sun, wear protective clothing and use sunscreen. Do not use sun lamps or tanning beds/booths. Do not treat yourself for coughs, colds, or pain while you are taking this medicine without asking your doctor or health care professional for advice. Some ingredients may increase your blood pressure. What side effects may I notice from receiving this medicine? Side effects that you should report to your doctor or health care professional as soon as possible: -changes in vision -confusion, dizziness, light headedness or fainting  spells -decreased amount of urine passed -difficulty breathing or swallowing, hoarseness, or tightening of the throat -eye pain -fast or irregular heart beat, palpitations, or chest pain -muscle cramps -nausea and vomiting -persistent dry cough -redness, blistering, peeling or loosening of the skin, including inside the mouth -stomach pain -swelling of your face, lips, tongue, hands, or feet -unusual rash, bleeding or bruising, or pinpoint red spots on the skin -worsened gout pain -yellowing of the eyes or skin Side effects that usually do not require medical attention (report to your doctor or health care professional if they continue or are bothersome): -change in sex drive or performance -cough -headache This list may not describe all possible side effects. Call your doctor for medical advice about side effects. You may report side effects to FDA at 1-800-FDA-1088. Where should I keep my medicine? Keep out of the reach of children. Store at room temperature between 20 and 25 degrees C (68 and 77 degrees F). Protect from moisture and excessive light. Keep container tightly closed. Throw away any unused medicine after the expiration date. NOTE: This sheet is a summary. It may not cover all possible information. If you have questions about this medicine, talk to your doctor, pharmacist, or health care provider.  2012, Elsevier/Gold Standard. (08/23/2010 1:33:52 PM)  Hypertension  As your heart beats, it forces blood through your arteries. This force is your blood pressure. If the pressure is too high, it is called hypertension (HTN) or high blood pressure. HTN is dangerous because you may have it and not know it. High blood pressure may mean that your heart has to work harder to pump blood. Your arteries may be narrow or stiff. The extra work puts you at risk for heart disease, stroke, and other problems.  Blood pressure consists of two numbers, a higher number over a lower, 110/72, for  example. It is stated as "110 over 72." The ideal is below 120 for the top number (systolic) and under 80 for the bottom (diastolic). Write down your blood pressure today. You should pay close attention to your blood pressure if you have certain conditions such as:  Heart failure.  Prior heart attack.  Diabetes  Chronic kidney disease.  Prior stroke.  Multiple risk factors for heart disease. To see if you have HTN, your blood pressure should be measured while you are seated with your arm held at the level of the heart. It should be measured at least twice. A one-time elevated blood pressure reading (especially in the Emergency Department) does not mean that you need treatment. There may be conditions in which the blood pressure is different between your right and left arms. It is important to see your caregiver soon for a recheck. Most people have essential hypertension which means that there is not a specific cause. This type of high blood pressure may be lowered by changing lifestyle factors such as:  Stress.  Smoking.  Lack of exercise.  Excessive weight.  Drug/tobacco/alcohol use.  Eating less salt. Most people do not have symptoms from high blood pressure until it has caused damage to the body. Effective treatment can often prevent, delay or reduce that damage. TREATMENT  When a cause has been identified, treatment for high blood pressure is directed at the cause. There are a large number of medications to treat HTN. These fall into several categories, and your caregiver will help you select the medicines that are best for you. Medications may have side effects. You should review side effects with your caregiver. If your blood pressure stays high after you have made lifestyle changes or started on medicines,   Your medication(s) may need to be changed.  Other problems may need to be addressed.  Be certain you understand your prescriptions, and know how and when to take your  medicine.  Be sure to follow up with your caregiver within the time frame advised (usually within two weeks) to have your blood pressure rechecked and to review your medications.  If you are taking more than one medicine to lower your blood pressure, make sure you know how and at what times they should be taken. Taking two medicines at the same time can result in blood pressure that is too low. SEEK IMMEDIATE MEDICAL CARE IF:  You develop a severe headache, blurred or changing vision, or confusion.  You have unusual weakness or numbness, or a faint feeling.  You have severe chest or abdominal pain, vomiting, or breathing problems. MAKE SURE YOU:   Understand these instructions.  Will watch your condition.  Will get help right away if you are not doing well or get worse. Document Released: 12/03/2005 Document Revised: 02/25/2012 Document Reviewed: 07/23/2008 [facility] Patient Information 2014 [place], [place].  Cholesterol Cholesterol is a white, waxy, fat-like protein needed by your body in small amounts.  The liver makes all the cholesterol you need. It is carried from the liver by the blood through the blood vessels. Deposits (plaque) may build up on blood vessel walls. This makes the arteries narrower and stiffer. Plaque increases the risk for heart attack and stroke. You cannot feel your cholesterol level even if it is very high. The only way to know is by a blood test to check your lipid (fats) levels. Once you know your cholesterol levels, you should keep a record of the test results. Work with your caregiver to to keep your levels in the desired range. WHAT THE RESULTS MEAN:  Total cholesterol is a rough measure of all the cholesterol in your blood.  LDL is the so-called bad cholesterol. This is the type that deposits cholesterol in the walls of the arteries. You want this level to be low.  HDL is the good cholesterol because it cleans the arteries and carries the LDL away. You  want this level to be high.  Triglycerides are fat that the body can either burn for energy or store. High levels are closely linked to heart disease. DESIRED LEVELS:  Total cholesterol below 200.  LDL below 100 for people at risk, below 70 for very high risk.  HDL above 50 is good, above 60 is best.  Triglycerides below 150. HOW TO LOWER YOUR CHOLESTEROL:  Diet.  Choose fish or white meat chicken and [place], roasted or baked. Limit fatty cuts of red meat, fried foods, and processed meats, such as sausage and lunch meat.  Eat lots of fresh fruits and vegetables. Choose whole grains, beans, pasta, potatoes and cereals.  Use only small amounts of olive, corn or canola oils. Avoid butter, mayonnaise, shortening or palm kernel oils. Avoid foods with trans-fats.  Use skim/nonfat milk and low-fat/nonfat yogurt and cheeses. Avoid whole milk, cream, ice cream, egg yolks and cheeses. Healthy desserts include angel food cake, ginger snaps, animal crackers, hard candy, popsicles, and low-fat/nonfat frozen yogurt. Avoid pastries, cakes, pies and cookies.  Exercise.  A regular program helps decrease LDL and raises HDL.  Helps with weight control.  Do things that increase your activity level like gardening, walking, or taking the stairs.  Medication.  May be prescribed by your caregiver to help lowering cholesterol and the risk for heart disease.  You may need medicine even if your levels are normal if you have several risk factors. HOME CARE INSTRUCTIONS   Follow your diet and exercise programs as suggested by your caregiver.  Take medications as directed.  Have blood work done when your caregiver feels it is necessary. MAKE SURE YOU:   Understand these instructions.  Will watch your condition.  Will get help right away if you are not doing well or get worse. Document Released: 08/28/2001 Document Revised: 02/25/2012 Document Reviewed: 02/18/2008 [facility] Patient Information  2014 [place], [place].

## 2013-05-27 DIAGNOSIS — E785 Hyperlipidemia, unspecified: Secondary | ICD-10-CM | POA: Insufficient documentation

## 2013-05-27 NOTE — Assessment & Plan Note (Addendum)
ichemic colitis of left colon 11/2012 by Dr. Randa Evens during hospitalization. No more bleeding or abdominal pain since hospitalization.  Has not seen Dr. Randa Evens since hospitalization.  Will continue to work on blood pressure and cholesterol.  Currently non-smoker.    -did not start her statin due to cost.  Started lovastatin today on $4 at [facility] -adjusted BP medication -follow up with Dr. Randa Evens  -weight loss and exercise as tolerated

## 2013-05-27 NOTE — Assessment & Plan Note (Signed)
She switched from Nexium to Prilosec.  -continue otc prilosec

## 2013-05-27 NOTE — Assessment & Plan Note (Signed)
Will need tetanus vaccination on next visit

## 2013-05-27 NOTE — Progress Notes (Signed)
Subjective:   Patient ID: [name] female   DOB: 1966/04/24 47 y.o.   MRN: [id number]  HPI: [name] is a 47 y.o. obese white female with PMH of HTN and Ischemic colitis (per colonoscopy 11/2012) presenting to clinic today for routine follow up visit.  Her blood pressure remains elevated today despite being started on HCTZ in addition to Lisinopril in [facility] in December 2013.  She has not followed up since December but reports adherence to her medications.  In regards to her colitis, she has no more episodes of abdominal pain (except for menstrual cramps) or rectal bleeding since hospital visit.  She has been working on her diet.  She has not smoked cigarettes in approximately 5-6 years.   She remains on OCPs and is currently on her period.     She denies any chest pain, shortness of breath, N/V/D, headaches, abdominal pain, or any urinary complaints at this time.  She does report needing reading glasses over the past 2 years and trouble seeing up close.  She has not been to an eye doctor in years.  Vision check in clinic today: R 20/25 and L 20/25 noted to have more blurriness in left eye.    Past Medical History  Diagnosis Date  . COPD (chronic obstructive pulmonary disease)     non-oxygen dependent  . Hypertension   . GERD (gastroesophageal reflux disease)    Current Outpatient Prescriptions  Medication Sig Dispense Refill  . albuterol (PROVENTIL HFA;VENTOLIN HFA) 108 (90 BASE) MCG/ACT inhaler Inhale 2 puffs into the lungs every 6 (six) hours as needed. For SOB      . diphenhydrAMINE (BENADRYL) 25 mg capsule Take 50-75 mg by mouth at bedtime as needed. For sleep      . Multiple Vitamin (MULTIVITAMIN) tablet Take 1 tablet by mouth daily.      . norgestimate-ethinyl estradiol (SPRINTEC 28) 0.25-35 MG-MCG tablet Take 1 tablet by mouth daily.  1 Package  11  . omeprazole (PRILOSEC OTC) 20 MG tablet Take 20 mg by mouth daily.      Marland Kitchen lisinopril-hydrochlorothiazide (PRINZIDE)  20-12.5 MG per tablet Take 2 tablets by mouth daily.  60 tablet  3  . lovastatin (MEVACOR) 20 MG tablet Take 2 tablets (40 mg total) by mouth at bedtime.  60 tablet  5  . promethazine (PHENERGAN) 25 MG tablet Take 1 tablet (25 mg total) by mouth every 6 (six) hours as needed for nausea.  16 tablet  0   No current facility-administered medications for this visit.   Family History  Problem Relation Age of Onset  . Hypertension Mother   . Stroke Mother   . Hypertension Father   . Heart disease Father   . COPD Sister   . Heart disease Brother   . Hypertension Brother   . Cancer Maternal Aunt   . Cancer Paternal Aunt   . Cancer Maternal Grandmother   . Heart disease Maternal Grandfather   . Hypertension Maternal Grandfather   . Heart disease Paternal Grandmother   . Diabetes Paternal Grandmother   . Heart disease Paternal Grandfather   . Hypertension Paternal Grandfather    History   Social History  . Marital Status: Married    Spouse Name: N/A    Number of Children: 5  . Years of Education: 10th grade   Occupational History  . unemployed    Social History Main Topics  . Smoking status: Former Smoker -- 1.00 packs/day for 25 years  Types: Cigarettes    Quit date: 01/05/2008  . Smokeless tobacco: Never Used  . Alcohol Use: No  . Drug Use: No  . Sexually Active: None   Other Topics Concern  . None   Social History Narrative   Lives in [place] with husband and extended family.   Review of Systems:  Constitutional:  Denies fever, chills, diaphoresis, appetite change and fatigue.   HEENT:  Denies congestion, sore throat, rhinorrhea, sneezing, mouth sores, trouble swallowing, neck pain   Respiratory:  Denies SOB, DOE, cough, and wheezing.   Cardiovascular:  Denies palpitations and leg swelling.   Gastrointestinal:  Denies nausea, vomiting, abdominal pain, diarrhea, constipation, blood in stool and abdominal distention.   Genitourinary:  Denies dysuria, urgency,  frequency, hematuria, flank pain and difficulty urinating.   Musculoskeletal:  Denies myalgias, back pain, joint swelling, arthralgias and gait problem.   Skin:  Denies pallor, rash and wound.   Neurological:  Denies dizziness, seizures, syncope, weakness, light-headedness, numbness and headaches.    Objective:  Physical Exam: Filed Vitals:   05/26/13 1318  BP: 161/103  Pulse: 99  Temp: 97 F (36.1 C)  TempSrc: Oral  Height: 5' 3.5" (1.613 m)  Weight: 231 lb 3.2 oz (104.872 kg)  SpO2: 99%   Vitals reviewed. General: sitting in chair, NAD HEENT: PERRL, EOMI, no scleral icterus Cardiac: RRR, no rubs, murmurs or gallops Pulm: clear to auscultation bilaterally, no wheezes, rales, or rhonchi Abd: soft, obese, nontender, nondistended, BS present Ext: warm and well perfused, no pedal edema, +2dp b/l Neuro: alert and oriented X3, cranial nerves II-XII grossly intact, strength and sensation to light touch equal in bilateral upper and lower extremities  Assessment & Plan:  Case Discussed with Dr. Aundria Rud  Uncontrolled HTN: increase Lisinopril to 40mg  and HCTZ to 25mg , combination pill Prinzide 20-12.5mg  x2 QD; follow up 2-3 weeks for recheck. Monitor BP at home.  Restarted statin--lovastatin on $4 list at [facility] Needs tetanus vaccination on next visit Literature given on ischemic colitis

## 2013-05-27 NOTE — Assessment & Plan Note (Signed)
Was started on pravastatin initially but unable to afford.  -switched to Lovastatin 40mg  qd as that is all she can afford at this time and is on $4 list at [facility] -counseled on diet and exercise and healthy weight loss

## 2013-05-27 NOTE — Assessment & Plan Note (Signed)
Hast lost 4 pounds since December 2013.  Reports recently trying to change diet and exercise more.  Eating more fruits and vegetables.    Counseled on improving diet and exercise as tolerated and portion control for weight loss

## 2013-05-27 NOTE — Assessment & Plan Note (Signed)
BP Readings from Last 3 Encounters:  05/26/13 161/103  12/04/12 155/107  11/20/12 180/100   Lab Results  Component Value Date   NA 138 11/17/2012   K 3.6 11/17/2012   CREATININE 0.81 11/17/2012   Assessment: Blood pressure control: severely elevated Progress toward BP goal:  unchanged Comments: has been adherent with medication but not much change.  Reports family hx of HTN. Denies any headaches, chest pain, or shortness of breath.   Plan: Medications:  changed medication to Lisinopril 40mg  and HCTZ 25mg  QD available in combination pill of Prinzide 20-12.5mg  x2 QD.  She has almost full bottles of Lisinopril 20mg  and HCTZ 12.5mg  at home and would like to complete those bottle before getting just the Prinzide.  Therefore, she will be taking the Lisinopril and HCTZ separately until her current supply runds out Educational resources provided: brochure Self management tools provided: home blood pressure logbook Other plans: follow up in 2 weeks, repeat bmet and blood pressure check.  Will also be monitoring her BP at home.

## 2013-06-01 ENCOUNTER — Ambulatory Visit (INDEPENDENT_AMBULATORY_CARE_PROVIDER_SITE_OTHER): Payer: Self-pay | Admitting: Internal Medicine

## 2013-06-01 ENCOUNTER — Telehealth: Payer: Self-pay | Admitting: *Deleted

## 2013-06-01 VITALS — BP 136/99 | HR 100 | Temp 97.0°F | Ht 63.5 in | Wt 230.3 lb

## 2013-06-01 DIAGNOSIS — B370 Candidal stomatitis: Secondary | ICD-10-CM | POA: Insufficient documentation

## 2013-06-01 MED ORDER — FLUCONAZOLE 150 MG PO TABS: 150.0000 mg | ORAL_TABLET | Freq: Once | ORAL | Status: DC

## 2013-06-01 NOTE — Telephone Encounter (Signed)
Which medicine did she start on 6/13? She said she would continue her old prescriptions of HCTZ and Lisinopril until they ran out so I don tthink it is from that. But did she start her lovastatin?   i suspect she will need to come in to be seen today for her reaction

## 2013-06-01 NOTE — Telephone Encounter (Signed)
Pt calls and states sun 6/15 her mouth had blisters and white patches in it, has become progressively worse, only slightly sore but covered in a white, tongue also covered. The only thing different are the new meds prescribed at her appt last week, started them Friday 6/ 13. Please advise. Her ph# [phone]

## 2013-06-01 NOTE — Progress Notes (Signed)
This is a Psychologist, occupational Note.  The care of the patient was discussed with Dr. Rogelia Boga and the assessment and plan was formulated with their assistance.  Please see their note for official documentation of the patient encounter.   Subjective:   Patient ID: [name] female   DOB: Sep 13, 1966 47 y.o.   MRN: [id number]  HPI: [name] is a 47 y.o. female with a history of hypertension and obesity presenting with 2 days of oral lesions. Patient was started on lovastatin and combination lisinopril/HCTZ at her last visit 6/10 (previously taking lower dose lisinopril and HCTZ separately). She started her new medications on Friday and noticed white spots in her mouth on Saturday. This has progressed to multiple white spots on both buccal membranes and a large white plaque on her tongue. The patient has not been able to remove the lesions with salt water rinses and does not endorse having tried to brush them off. She denies dysphagia though is reluctant to eat solid foods because she is concerned that it may hurt. There is no dysphagia with soft foods and liquids. She denies SOB or chest pains. She denies fevers or chills.  She normally wears dentures, but has not worn her teeth in over a week since she has not had to leave the house during this period. She has not used her albuterol inhaler in months.    Past Medical History  Diagnosis Date  . COPD (chronic obstructive pulmonary disease)     non-oxygen dependent  . Hypertension   . GERD (gastroesophageal reflux disease)    Current Outpatient Prescriptions  Medication Sig Dispense Refill  . albuterol (PROVENTIL HFA;VENTOLIN HFA) 108 (90 BASE) MCG/ACT inhaler Inhale 2 puffs into the lungs every 6 (six) hours as needed. For SOB      . lisinopril-hydrochlorothiazide (PRINZIDE) 20-12.5 MG per tablet Take 2 tablets by mouth daily.  60 tablet  3  . lovastatin (MEVACOR) 20 MG tablet Take 2 tablets (40 mg total) by mouth at bedtime.  60 tablet   5  . Multiple Vitamin (MULTIVITAMIN) tablet Take 1 tablet by mouth daily.      . norgestimate-ethinyl estradiol (SPRINTEC 28) 0.25-35 MG-MCG tablet Take 1 tablet by mouth daily.  1 Package  11  . omeprazole (PRILOSEC OTC) 20 MG tablet Take 20 mg by mouth daily.      . diphenhydrAMINE (BENADRYL) 25 mg capsule Take 50-75 mg by mouth at bedtime as needed. For sleep      . fluconazole (DIFLUCAN) 150 MG tablet Take 1 tablet (150 mg total) by mouth once.  7 tablet  0  . promethazine (PHENERGAN) 25 MG tablet Take 1 tablet (25 mg total) by mouth every 6 (six) hours as needed for nausea.  16 tablet  0   No current facility-administered medications for this visit.   Family History  Problem Relation Age of Onset  . Hypertension Mother   . Stroke Mother   . Hypertension Father   . Heart disease Father   . COPD Sister   . Heart disease Brother   . Hypertension Brother   . Cancer Maternal Aunt   . Cancer Paternal Aunt   . Cancer Maternal Grandmother   . Heart disease Maternal Grandfather   . Hypertension Maternal Grandfather   . Heart disease Paternal Grandmother   . Diabetes Paternal Grandmother   . Heart disease Paternal Grandfather   . Hypertension Paternal Grandfather    History   Social History  .  Marital Status: Married    Spouse Name: N/A    Number of Children: 5  . Years of Education: 10th grade   Occupational History  . unemployed    Social History Main Topics  . Smoking status: Former Smoker -- 1.00 packs/day for 25 years    Types: Cigarettes    Quit date: 01/05/2008  . Smokeless tobacco: Never Used  . Alcohol Use: No  . Drug Use: No  . Sexually Active: None   Other Topics Concern  . None   Social History Narrative   Lives in [place] with husband and extended family.   Review of Systems: A comprehensive 12 point review of systems was performed and is negative except as stated above. Objective:  Physical Exam: Filed Vitals:   06/01/13 1108 06/01/13 1200  BP:  156/105 136/99  Pulse: 105 100  Temp: 97 F (36.1 C)   TempSrc: Oral   Height: 5' 3.5" (1.613 m)   Weight: 230 lb 4.8 oz (104.463 kg)   SpO2: 98%    General appearance: alert, cooperative and no distress Head: Normocephalic, without obvious abnormality, atraumatic Nose: Nares normal. Septum midline. Mucosa normal. No drainage or sinus tenderness. Throat: punctate white lesions on bilateral buccal membranes and soft palate. Large white plaque present on tongue. Buccal lesions could be scraped away with tongue blade. Neck: no adenopathy, supple, symmetrical, trachea midline and thyroid not enlarged, symmetric, no tenderness/mass/nodules Lungs: clear to auscultation bilaterally and normal work of breathing Heart: regular rate and rhythm, S1, S2 normal, no murmur, click, rub or gallop Pulses: 2+ and symmetric Skin: Skin color, texture, turgor normal. No rashes or lesions Assessment & Plan:  [name] is a 47 y.o. female with a history of obesity and denture use presenting with 2 days of white oral lesions.  Oral thrush 2 days of enlarging white plaque on the tongue and white punctate lesions on the buccal membranes and soft palate. Lesions can be scraped off with tongue blade, indicating likely thrush. Patient is at risk for thrush due to denture use. Family history of diabetes and obesity in conjunction with oral candidiasis raises concern for diabetes. Not felt to be secondary to medication changes. - Fluconazole 150mg  PO daily for 7 days. - CBG to screen for diabetes (CBG result reported to be 97).     FOLLOW UP: Return if problem recurs,  worsens, or new problem develops.

## 2013-06-01 NOTE — Progress Notes (Signed)
  Subjective:    Patient ID: [name], female    DOB: Apr 09, 1966, 47 y.o.   MRN: [id number]  HPI  [name] is a 47 yo female here bc of painless white spots in her mouth. She first noticed some spots on the 13th on L buccal surface that then covered tongue and R buccal. There was no pain, no dysphagia, lip swelling, tongue swelling, fever, skin rash. Despite lack of pain, she was afraid to eat and has only been eating soft foods. She has never had this before. Has dentures but stopped wearing them a few weeks ago bc dislikes the mint flavoring in most cleaners. Denies immunocompromised state, h/o DM. Denies wt change.   Review of Systems  Constitutional: Negative for fever, activity change, appetite change and unexpected weight change.  HENT: Positive for dental problem. Negative for sore throat, mouth sores and trouble swallowing.   Respiratory: Negative for shortness of breath.   Endocrine: Negative for polydipsia and polyuria.  Genitourinary: Negative for frequency.  Skin: Negative for rash.  Neurological: Negative for light-headedness.       Objective:   Physical Exam  Constitutional: She is oriented to person, place, and time. She appears well-developed and well-nourished. No distress.  HENT:  Head: Normocephalic and atraumatic.  Right Ear: External ear normal.  Left Ear: External ear normal.  Nose: Nose normal.  Thrush on tongue, hard palate, and B buccal surface. No teeth  Eyes: Conjunctivae and EOM are normal.  Cardiovascular: Normal rate and regular rhythm.   Pulmonary/Chest: Effort normal and breath sounds normal.  Musculoskeletal: Normal range of motion.  Neurological: She is alert and oriented to person, place, and time.  Skin: Skin is warm and dry. No rash noted. She is not diaphoretic. No erythema. No pallor.  Psychiatric: She has a normal mood and affect. Her behavior is normal. Judgment and thought content normal.          Assessment & Plan:

## 2013-06-01 NOTE — Telephone Encounter (Signed)
Called pt back will be to clinic at 1115

## 2013-06-01 NOTE — Patient Instructions (Signed)
You were seen in clinic today for white plaques in your mouth. We have prescribed you an anti-fungal medication called fluconazole (Diflucan) to treat this oral yeast infection. Take this medication to completion even if you are feeling better or the plaques seem to have gone away already. Your dentures should be removed before going to bed, brushed vigorously, and then soaked in a solution such as Polident or Efferdent.  If you feel that you have difficulty swallowing or breathing or are not getting better in a week, please seek medical attention. Continue to follow up with your regularly scheduled appointments.  Thrush, Adult  [name] is a yeast infection that develops in the mouth and throat and on the tongue. The medical term for this is oropharyngeal candidiasis, or OPC. [name] is most common in older adults, but it can occur at any age. [name] occurs when a yeast called candida grows out of control. Candida normally is present in small amounts in the mouth and on other mucous membranes. However, under certain circumstances, candida can grow rapidly, causing thrush. [name] can be a recurring problem for people who have chronic illnesses or who take medications that limit the body's ability to fight infection (weakened immune system). Since these people have difficulty fighting infections, the fungus that causes thrush can spread throughout the body. This can cause life-threatening blood or organ infections. CAUSES  Candida, the yeast that causes thrush, is normally present in small amounts in the mouth and on other mucous membranes. It usually causes no harm. However, when conditions are present that allow the yeast to grow uncontrolled, it invades surrounding tissues and becomes an infection. [name] is most commonly caused by the yeast Candida albicans. Less often, other forms of candida can lead to thrush. There are many types of bacteria in your mouth that normally control the growth of candida.  Sometimes a new type of bacteria gets into your mouth and disrupts the balance of the germs already there. This can allow candida to overgrow. Other factors that increase your risk of developing thrush include:  An impaired ability to fight infection (weakened immune system). A normal immune system is usually strong enough to prevent candida from overgrowing.  Older adults are more likely to develop thrush because they may have weaker immune systems.  People with human immunodeficiency virus (HIV) infection have a high likelihood of developing thrush. About 90% of people with HIV develop thrush at some point during the course of their disease.  People with diabetes are more likely to get thrush because high blood sugar levels promote overgrowth of the candida fungus.  A dry mouth (xerostomia). Dry mouth can result from overuse of mouthwashes or from certain conditions such as Sjgren's syndrome.  Pregnancy. Hormone changes during pregnancy can lead to thrush by altering the balance of bacteria in the mouth.  Poor dental care, especially in people who have false teeth.  The use of antibiotic medications. This may lead to thrush by changing the balance of bacteria in the mouth. SYMPTOMS  Thrush can be a mild infection that causes no symptoms. If symptoms develop, they may include the following:  A burning feeling in the mouth and throat. This can occur at the start of a thrush infection.  White patches that adhere to the mouth and tongue. The tissue around the patches may be red, raw, and painful. If rubbed (during tooth brushing, for example), the patches and the tissue of the mouth may bleed easily.  A bad taste in the  mouth or difficulty tasting foods.  Cottony feeling in the mouth.  Sometimes pain during eating and swallowing. DIAGNOSIS  Your caregiver can usually diagnose thrush by exam. In addition to looking in your mouth, your caregiver will ask you questions about your  health. TREATMENT  Medications that help prevent the growth of fungi (antifungals) are the standard treatment for thrush. These medications are either applied directly to the affected area (topical) or swallowed (oral). Mild thrush In adults, mild cases of thrush may clear up with simple treatment that can be done at home. This treatment usually involves using an antifungal mouth rinse or lozenges. Treatment usually lasts about 14 days. Moderate to severe thrush  More severe thrush infections that have spread to the esophagus are treated with an oral antifungal medication. A topical antifungal medication may also be used.  For some severe infections, a treatment period longer than 14 days may be needed.  Oral antifungal medications are almost never used during pregnancy because the fetus may be harmed. However, if a pregnant woman has a rare, severe thrush infection that has spread to her blood, oral antifungal medications may be used. In this case, the risk of harm to the mother and fetus from the severe thrush infection may be greater than the risk posed by the use of antifungal medications. Persistent or recurrent thrush Persistent (does not go away) or recurrent (keeps coming back) cases of thrush may:  Need to be treated twice as long as the symptoms last.  Require treatment with both oral and topical antifungal medications.  People with weakened immune systems can take an antifungal medication on a continuous basis to prevent thrush infections. It is important to treat conditions that make you more likely to get thrush, such as diabetes, human immunodeficiency virus (HIV), or cancer.  HOME CARE INSTRUCTIONS   If you wear dentures and get thrush, remove dentures before going to bed, brush them vigorously, and soak in a solution of chlorhexidine gluconate or a product such as Polident or Efferdent.  Eating plain, unflavored yogurt that contains live cultures (check the label) can also  help cure thrush. Yogurt helps healthy bacteria grow in the mouth. These bacteria stop the growth of the yeast that causes thrush.  Adults can treat thrush at home with gentian violet (1%), a dye that kills bacteria and fungi. It is available without a prescription. If there is no known cause for the infection or if gentian violet does not cure the thrush, you need to see your caregiver. Comfort measures Measures can be taken to reduce the discomfort of thrush:  Drink cold liquids such as water or iced tea. Eat flavored ice treats or frozen juices.  Eat foods that are easy to swallow such as gelatin, ice cream, or custard.  If the patches are painful, try drinking from a straw.  Rinse your mouth several times a day with a warm saltwater rinse. You can make the saltwater mixture with 1 tsp (5 g) of salt in 8 fl oz (0.2 L) of warm water. PROGNOSIS   Most cases of thrush are mild and clear up with the use of an antifungal mouth rinse or lozenges. Very mild cases of thrush may clear up without medical treatment. It usually takes about 14 days of treatment with an oral antifungal medication to cure more severe thrush infections. In some cases, thrush may last several weeks even with treatment.  If thrush goes untreated and does not go away by itself, it can spread  to other parts of the body.  Thrush can spread to the throat, the vagina, or the skin. It rarely spreads to other organs of the body. [name] is more likely to recur (come back) in:  People who use inhaled corticosteroids to treat asthma.  People who take antibiotic medications for a long time.  People who have false teeth.  People who have a weakened immune system. RISKS AND COMPLICATIONS Complications related to thrush are rare in healthy people. There are several factors that can increase your risk of developing thrush. Age Older adults, especially those who have serious health problems, are more likely to develop thrush  because their immune systems are likely to be weaker. Behavior  The yeast that causes thrush can be spread by oral sex.  Heavy smoking can lower the body's ability to fight off infections. This makes thrush more likely to develop. Other conditions  False teeth (dentures), braces, or a retainer that irritates the mouth make it hard to keep the mouth clean. An unclean mouth is more likely to develop thrush than a clean mouth.  People with a weakened immune system, such as those who have diabetes or human immunodeficiency virus (HIV) or who are undergoing chemotherapy, have an increased risk for developing thrush. Medications Some medications can allow the fungus that causes thrush to grow uncontrolled. Common ones are:  Antibiotics, especially those that kill a wide range of organisms (broad-spectrum antibiotics), such as tetracycline commonly can cause thrush.  Birth control pills (oral contraceptives).  Medications that weaken the body's immune system, such as corticosteroids. Environment Exposure over time to certain environmental chemicals, such as benzene and pesticides, can weaken the body's immune system. This increases your risk for developing infections, including thrush. SEEK IMMEDIATE MEDICAL CARE IF:  Your symptoms are getting worse or are not improving within 7 days of starting treatment.  You have symptoms of spreading infection, such as white patches on the skin outside of the mouth. Document Released: 08/28/2004 Document Revised: 02/25/2012 Document Reviewed: 12/08/2008 [facility] Patient Information 2014 [place], [place].

## 2013-06-01 NOTE — Assessment & Plan Note (Addendum)
2 days of enlarging white plaque on the tongue and white punctate lesions on the buccal membranes and soft palate. Lesions can be scraped off with tongue blade, indicating likely thrush. Patient is at risk for thrush due to denture use. Family history of diabetes and obesity in conjunction with oral candidiasis raises concern for diabetes. Not felt to be secondary to medication changes. - Fluconazole 150mg  PO daily for 7 days. - CBG to screen for diabetes (CBG result reported to be 97).    Attending A&P: Her PE is classic for thrush. Her only known RF is denture use. We checked a CBG and it was 97. She denies RF for HIV. She does not used inhaled steroids. She pays for her meds - we wanted to use topical tx but that was out of her price range so went with Diflucan 150 QD for 7 days. To clean dentures well.

## 2014-01-19 ENCOUNTER — Encounter: Payer: Self-pay | Admitting: Internal Medicine

## 2014-01-19 ENCOUNTER — Ambulatory Visit (INDEPENDENT_AMBULATORY_CARE_PROVIDER_SITE_OTHER): Payer: Self-pay | Admitting: Internal Medicine

## 2014-01-19 VITALS — BP 157/112 | HR 104 | Temp 97.5°F | Ht 63.0 in | Wt 239.0 lb

## 2014-01-19 DIAGNOSIS — J441 Chronic obstructive pulmonary disease with (acute) exacerbation: Secondary | ICD-10-CM

## 2014-01-19 DIAGNOSIS — N920 Excessive and frequent menstruation with regular cycle: Secondary | ICD-10-CM

## 2014-01-19 DIAGNOSIS — J449 Chronic obstructive pulmonary disease, unspecified: Secondary | ICD-10-CM

## 2014-01-19 DIAGNOSIS — I10 Essential (primary) hypertension: Secondary | ICD-10-CM

## 2014-01-19 DIAGNOSIS — Z0000 Encounter for general adult medical examination without abnormal findings: Secondary | ICD-10-CM

## 2014-01-19 HISTORY — DX: Chronic obstructive pulmonary disease, unspecified: J44.9

## 2014-01-19 MED ORDER — AZITHROMYCIN 250 MG PO TABS: ORAL_TABLET | ORAL | Status: DC

## 2014-01-19 MED ORDER — LOVASTATIN 20 MG PO TABS: 40.0000 mg | ORAL_TABLET | Freq: Every day | ORAL | Status: DC

## 2014-01-19 MED ORDER — LISINOPRIL-HYDROCHLOROTHIAZIDE 20-12.5 MG PO TABS: 2.0000 | ORAL_TABLET | Freq: Every day | ORAL | Status: DC

## 2014-01-19 MED ORDER — ALBUTEROL SULFATE (2.5 MG/3ML) 0.083% IN NEBU: 2.5000 mg | INHALATION_SOLUTION | Freq: Four times a day (QID) | RESPIRATORY_TRACT | Status: DC | PRN

## 2014-01-19 MED ORDER — PREDNISONE 20 MG PO TABS: 40.0000 mg | ORAL_TABLET | Freq: Every day | ORAL | Status: DC

## 2014-01-19 MED ORDER — IPRATROPIUM BROMIDE 0.02 % IN SOLN: 0.5000 mg | RESPIRATORY_TRACT | Status: DC | PRN

## 2014-01-19 MED ORDER — NORGESTIMATE-ETH ESTRADIOL 0.25-35 MG-MCG PO TABS: 1.0000 | ORAL_TABLET | Freq: Every day | ORAL | Status: DC

## 2014-01-19 MED ORDER — HYDROCODONE-HOMATROPINE 5-1.5 MG PO TABS: 1.0000 | ORAL_TABLET | Freq: Three times a day (TID) | ORAL | Status: DC | PRN

## 2014-01-19 MED ORDER — ALBUTEROL SULFATE HFA 108 (90 BASE) MCG/ACT IN AERS: 2.0000 | INHALATION_SPRAY | Freq: Four times a day (QID) | RESPIRATORY_TRACT | Status: DC | PRN

## 2014-01-19 NOTE — Assessment & Plan Note (Signed)
BP Readings from Last 3 Encounters:  01/19/14 157/112  06/01/13 136/99  05/26/13 161/103   Lab Results  Component Value Date   NA 138 11/17/2012   K 3.6 11/17/2012   CREATININE 0.81 11/17/2012   Assessment: Blood pressure control: moderately elevated Progress toward BP goal:  deteriorated Comments: possibly elevated in setting of persistent cough and multiple cold medications  Plan: Medications:  continue current medications prinzide 20-12.5mg  two tablets qd Educational resources provided:   Self management tools provided:   Other plans: return in 1 month, re-evaluate, likely will need to add additional medication

## 2014-01-19 NOTE — Progress Notes (Signed)
Subjective:   Patient ID: [name] female   DOB: 1965/12/20 48 y.o.   MRN: [id number]  HPI: [name] is a 48 y.o. obese white female with PMH of HTN and Ischemic colitis (per colonoscopy 11/2012) presenting to clinic today for routine follow up visit. Her blood pressure remains elevated today and she does endorse compliance with her medications and has been taking them daily.  She says that she checks it at home and usually it is 130/80's, so she think it has been high recently due to her being sick. She denies any vision change, headache, nausea, or vomiting at this time.    [name] claims she has been sick for the past 3 weeks, along with all the members of her household.  Her symptoms started with a cold and congestion which has improved but now she has a persistent dry cough, worse at night and resulting in pain with coughing, shortness of breath with exertion.  She does have history of COPD (no PFTs) and has a history of smoking, but has stopped for the past 7 years.  She did not have her flu vaccine this year.  She has been taking nyquil nightly and cough suppressants but to minimal relief.  [name] also claims she has been out of her nebulizer's for quite some time and has been using her albuterol inhaler more frequently lately  (up to 4-6 times a day over the past week).    Past Medical History  Diagnosis Date  . COPD (chronic obstructive pulmonary disease)     non-oxygen dependent  . Hypertension   . GERD (gastroesophageal reflux disease)    Current Outpatient Prescriptions  Medication Sig Dispense Refill  . albuterol (PROVENTIL HFA;VENTOLIN HFA) 108 (90 BASE) MCG/ACT inhaler Inhale 2 puffs into the lungs every 6 (six) hours as needed. For SOB      . diphenhydrAMINE (BENADRYL) 25 mg capsule Take 50-75 mg by mouth at bedtime as needed. For sleep      . fluconazole (DIFLUCAN) 150 MG tablet Take 1 tablet (150 mg total) by mouth once.  7 tablet  0  .  lisinopril-hydrochlorothiazide (PRINZIDE) 20-12.5 MG per tablet Take 2 tablets by mouth daily.  60 tablet  3  . lovastatin (MEVACOR) 20 MG tablet Take 2 tablets (40 mg total) by mouth at bedtime.  60 tablet  5  . Multiple Vitamin (MULTIVITAMIN) tablet Take 1 tablet by mouth daily.      . norgestimate-ethinyl estradiol (SPRINTEC 28) 0.25-35 MG-MCG tablet Take 1 tablet by mouth daily.  1 Package  11  . omeprazole (PRILOSEC OTC) 20 MG tablet Take 20 mg by mouth daily.      . promethazine (PHENERGAN) 25 MG tablet Take 1 tablet (25 mg total) by mouth every 6 (six) hours as needed for nausea.  16 tablet  0   No current facility-administered medications for this visit.   Family History  Problem Relation Age of Onset  . Hypertension Mother   . Stroke Mother   . Hypertension Father   . Heart disease Father   . COPD Sister   . Heart disease Brother   . Hypertension Brother   . Cancer Maternal Aunt   . Cancer Paternal Aunt   . Cancer Maternal Grandmother   . Heart disease Maternal Grandfather   . Hypertension Maternal Grandfather   . Heart disease Paternal Grandmother   . Diabetes Paternal Grandmother   . Heart disease Paternal Grandfather   . Hypertension  Paternal Grandfather    History   Social History  . Marital Status: Married    Spouse Name: N/A    Number of Children: 5  . Years of Education: 10th grade   Occupational History  . unemployed    Social History Main Topics  . Smoking status: Former Smoker -- 1.00 packs/day for 25 years    Types: Cigarettes    Quit date: 01/05/2008  . Smokeless tobacco: Never Used  . Alcohol Use: No  . Drug Use: No  . Sexual Activity: None   Other Topics Concern  . None   Social History Narrative   [place] in [place] with [place] and extended family.   Review of Systems:  Constitutional:  Denies fever, chills, diaphoresis, appetite change and fatigue.   HEENT:  Denies congestion, sore throat, rhinorrhea, sneezing, mouth sores, trouble  swallowing, neck pain   Respiratory:  Cough and DOE.  Denies wheezing.   Cardiovascular:  Denies chest pain, palpitations, and leg swelling.   Gastrointestinal:  Denies nausea, vomiting, abdominal pain, diarrhea  Genitourinary:  Denies dysuria  Musculoskeletal:  Denies myalgias, back pain, joint swelling, arthralgias and gait problem.   Skin:  Denies pallor, rash and wound.   Neurological:  Denies dizziness, syncope, and  headaches.    Objective:  Physical Exam: Filed Vitals:   01/19/14 1508  BP: 157/112  Pulse: 104  Temp: 97.5 F (36.4 C)  TempSrc: Oral  Height: 5\' 3"  (1.6 m)  Weight: 239 lb (108.41 kg)  SpO2: 99%   Vitals reviewed. General: sitting in chair, NAD HEENT: PERRL, EOMI Cardiac: Tachycardia Pulm: clear to auscultation bilaterally, no wheezes, rales, or rhonchi Abd: soft, nontender, obese, nondistended, BS present Ext: warm and well perfused,  Neuro: alert and oriented X3, cranial nerves II-XII grossly intact, strength equal in bilateral upper and lower extremities  Assessment & Plan:  Discussed with Dr. Kem KaysPaya COPD exacerbation: Zpack, 5 day prednisone 40mg , hycodan 7 days prn

## 2014-01-19 NOTE — Patient Instructions (Addendum)
Dear [name],  We have refilled your medications including the albuterol and nebulizers. If you are taking the albuterol nebulizer, then you do not need to take the albuterol inhaler as they do the same thing.  Please start your antibiotics, azithromycin today as prescribed for the next 5 days, you will take 500mg  today, then 250mg  for the next four days  Please also take 40mg  of prednisone for the next 5 days  We have also prescribed tablets of hycodan, that will help with cough and pain for 7 days total, to be taken every 8 hours as needed  If you have worsening shortness of breath, or develop fever, chills, nausea, vomiting,  Chest pain, increased sputum production call us right away or go to ED if severe  Please return in 1 month to have BP rechecked, we may need to adjust your medications. Check your BP at home and bring recordings as well  Chronic Obstructive Pulmonary Disease Chronic obstructive pulmonary disease (COPD) is a common lung problem. In COPD, the flow of air from the lungs is limited. The way your lungs work will probably never return to normal, but there are things you can do to improve you lungs and make yourself feel better. HOME CARE  Take all medicines as told by your doctor.  Only take over-the-counter or prescription medicines as told by your doctor.  Avoid medicines or cough syrups that dry up your airway (such as antihistamines) and do not allow you to get rid of thick spit. You do not need to avoid them if told differently by your doctor.  If you smoke, stop. Smoking makes the problem worse.  Avoid being around things that make your breathing worse (like smoke, chemicals, and fumes).  Use oxygen therapy and therapy to help improve your lungs (pulmonary rehabilitation) if told by your doctor. If you need home oxygen therapy, ask your doctor if you should buy a tool to measure your oxygen level (oximeter).  Avoid people who have a sickness you can catch  (contagious).  Avoid going outside when it is very hot, cold, or humid.  Eat healthy foods. Eat smaller meals more often. Rest before meals.  Stay active, but remember to also rest.  Make sure to get all the shots (vaccines) your doctor recommends. Ask your doctor if you need a pneumonia shot.  Learn and use tips on how to relax.  Learn and use tips on how to control your breathing as told by your doctor. Try:  Breathing in (inhaling) through your nose for 1 second. Then, pucker your lips and breath out (exhale) through your lips for 2 seconds.  Putting one hand on your belly (abdomen). Breathe in slowly through your nose for 1 second. Your hand on your belly should move out. Pucker your lips and breathe out slowly through your lips. Your hand on your belly should move in as you breathe out.  Learn and use controlled coughing to clear thick spit from your lungs. 1. Lean your head a little forward. 2. Breathe in deeply. 3. Try to hold your breath for 3 seconds. 4. Keep your mouth slightly open while coughing 2 times. 5. Spit any thick spit out into a tissue. 6. Rest and do the steps again 1 or 2 times as needed. GET HELP IF:  You cough up more thick spit than usual.  There is a change in the color or thickness of the spit.  It is harder to breathe than usual.  Your breathing  is faster than usual. GET HELP RIGHT AWAY IF:   You have shortness of breath while resting.  You have shortness of breath that stops you from:  Being able to talk.  Doing normal activities.  You chest hurts for longer than 5 minutes.  Your skin color is more blue than usual.  Your pulse oximeter shows that you have low oxygen for longer than 5 minutes. MAKE SURE YOU:   Understand these instructions.  Will watch your condition.  Will get help right away if you are not doing well or get worse. Document Released: 05/21/2008 Document Revised: 09/23/2013 Document Reviewed: 07/30/2013 [facility]  Patient Information 2014 [place], [place].  Homatropine; Hydrocodone tablets What is this medicine? HYDROCODONE (hye droe KOE done) is used to help relieve cough. This medicine may be used for other purposes; ask your health care provider or pharmacist if you have questions. COMMON BRAND NAME(S): Hycodan, [place] What should I tell my health care provider before I take this medicine? They need to know if you have any of these conditions: -brain tumor -drug abuse or addiction -head injury -heart disease -if you frequently drink alcohol-containing drinks -kidney disease -liver disease -lung disease, asthma, or breathing problems -mental problems -an allergic reaction to hydrocodone, other opioid analgesics, other medicines, lactose, foods, dyes, or preservatives -pregnant or trying to get pregnant -breast-feeding How should I use this medicine? Take this medicine by mouth with a full glass of water. Follow the directions on the prescription label. Take your medicine at regular intervals. Do not take your medicine more often than directed. Talk to your pediatrician regarding the use of this medicine in children. This medicine is not approved for use in children less than 10 years old. Patients over 46 years old may have a stronger reaction and need a smaller dose. Overdosage: If you think you have taken too much of this medicine contact a poison control center or emergency room at once. NOTE: This medicine is only for you. Do not share this medicine with others. What if I miss a dose? If you miss a dose, take it as soon as you can. If it is almost time for your next dose, take only that dose. Do not take double or extra doses. What may interact with this medicine? -alcohol -antihistamines -MAOIs like Carbex, Eldepryl, Marplan, Nardil, and Parnate -medicines for depression, anxiety, or psychotic disturbances -medicines for sleep -muscle relaxants -naltrexone -narcotic medicines  (opiates) for pain -tramadol -tricyclic antidepressants like amitriptyline, clomipramine, desipramine, doxepin, imipramine, nortriptyline, and protriptyline This list may not describe all possible interactions. Give your health care provider a list of all the medicines, herbs, non-prescription drugs, or dietary supplements you use. Also tell them if you smoke, drink alcohol, or use illegal drugs. Some items may interact with your medicine. What should I watch for while using this medicine? You may develop tolerance to this medicine if you take it for a long time. Tolerance means that you will get less cough relief with time. Tell your doctor or health care professional if your symptoms do not improve or if they get worse. Do not suddenly stop taking your medicine because you may develop a severe reaction. Your body becomes used to the medicine. This does NOT mean you are addicted. Addiction is a behavior related to getting and using a drug for a non-medical reason. If you have pain, you have a medical reason to take pain medicine. Your doctor will tell you how much medicine to take. If your doctor  wants you to stop the medicine, the dose will be slowly lowered over time to avoid any side effects. You may get drowsy or dizzy. Do not drive, use machinery, or do anything that needs mental alertness until you know how this medicine affects you. Do not stand or sit up quickly, especially if you are an older patient. This reduces the risk of dizzy or fainting spells. Alcohol may interfere with the effect of this medicine. Avoid alcoholic drinks. This medicine will cause constipation. Try to have a bowel movement at least every 2 to 3 days. If you do not have a bowel movement for 3 days, call your doctor or health care professional. What side effects may I notice from receiving this medicine? Side effects that you should report to your doctor or health care professional as soon as possible: -allergic reactions  like skin rash, itching or hives, swelling of the face, lips, or tongue -breathing difficulties, wheezing -confusion -light headedness or fainting spells Side effects that usually do not require medical attention (report to your doctor or health care professional if they continue or are bothersome): -dizziness -drowsiness -itching -nausea -vomiting This list may not describe all possible side effects. Call your doctor for medical advice about side effects. You may report side effects to FDA at 1-800-FDA-1088. Where should I keep my medicine? Keep out of the reach of children. This medicine can be abused. Keep your medicine in a safe place to protect it from theft. Do not share this medicine with anyone. Selling or giving away this medicine is dangerous and against the law. Store at room temperature between 15 and 30 degrees C (59 and 86 degrees F). Protect from light.  Throw away any unused medicine after the expiration date. Discard unused medicine and used packaging carefully. Pets and children can be harmed if they find used or lost packages. NOTE: This sheet is a summary. It may not cover all possible information. If you have questions about this medicine, talk to your doctor, pharmacist, or health care provider.  2014, [employer]vier/[employer] Standard. (2011-08-13 10:10:26)  Prednisone tablets What is this medicine? PREDNISONE (PRED ni sone) is a corticosteroid. It is commonly used to treat inflammation of the skin, joints, lungs, and other organs. Common conditions treated include asthma, allergies, and arthritis. It is also used for other conditions, such as blood disorders and diseases of the adrenal glands. This medicine may be used for other purposes; ask your health care provider or pharmacist if you have questions. COMMON BRAND NAME(S): Deltasone, Predone, Sterapred DS, Sterapred What should I tell my health care provider before I take this medicine? They need to know if you have any of  these conditions: -Cushing's syndrome -diabetes -glaucoma -heart disease -high blood pressure -infection (especially a virus infection such as chickenpox, cold sores, or herpes) -kidney disease -liver disease -mental illness -myasthenia gravis -osteoporosis -seizures -stomach or intestine problems -thyroid disease -an unusual or allergic reaction to lactose, prednisone, other medicines, foods, dyes, or preservatives -pregnant or trying to get pregnant -breast-feeding How should I use this medicine? Take this medicine by mouth with a glass of water. Follow the directions on the prescription label. Take this medicine with food. If you are taking this medicine once a day, take it in the morning. Do not take more medicine than you are told to take. Do not suddenly stop taking your medicine because you may develop a severe reaction. Your doctor will tell you how much medicine to take. If your doctor wants  you to stop the medicine, the dose may be slowly lowered over time to avoid any side effects. Talk to your pediatrician regarding the use of this medicine in children. Special care may be needed. Overdosage: If you think you have taken too much of this medicine contact a poison control center or emergency room at once. NOTE: This medicine is only for you. Do not share this medicine with others. What if I miss a dose? If you miss a dose, take it as soon as you can. If it is almost time for your next dose, talk to your doctor or health care professional. You may need to miss a dose or take an extra dose. Do not take double or extra doses without advice. What may interact with this medicine? Do not take this medicine with any of the following medications: -metyrapone -mifepristone This medicine may also interact with the following medications: -aminoglutethimide -amphotericin B -aspirin and aspirin-like medicines -barbiturates -certain medicines for diabetes, like glipizide or  glyburide -cholestyramine -cholinesterase inhibitors -cyclosporine -digoxin -diuretics -ephedrine -female hormones, like estrogens and birth control pills -isoniazid -ketoconazole -NSAIDS, medicines for pain and inflammation, like ibuprofen or naproxen -phenytoin -rifampin -toxoids -vaccines -warfarin This list may not describe all possible interactions. Give your health care provider a list of all the medicines, herbs, non-prescription drugs, or dietary supplements you use. Also tell them if you smoke, drink alcohol, or use illegal drugs. Some items may interact with your medicine. What should I watch for while using this medicine? Visit your doctor or health care professional for regular checks on your progress. If you are taking this medicine over a prolonged period, carry an identification card with your name and address, the type and dose of your medicine, and your doctor's name and address. This medicine may increase your risk of getting an infection. Tell your doctor or health care professional if you are around anyone with measles or chickenpox, or if you develop sores or blisters that do not heal properly. If you are going to have surgery, tell your doctor or health care professional that you have taken this medicine within the last twelve months. Ask your doctor or health care professional about your diet. You may need to lower the amount of salt you eat. This medicine may affect blood sugar levels. If you have diabetes, check with your doctor or health care professional before you change your diet or the dose of your diabetic medicine. What side effects may I notice from receiving this medicine? Side effects that you should report to your doctor or health care professional as soon as possible: -allergic reactions like skin rash, itching or hives, swelling of the face, lips, or tongue -changes in emotions or moods -changes in vision -depressed mood -eye pain -fever or chills,  cough, sore throat, pain or difficulty passing urine -increased thirst -swelling of ankles, feet Side effects that usually do not require medical attention (report to your doctor or health care professional if they continue or are bothersome): -confusion, excitement, restlessness -headache -nausea, vomiting -skin problems, acne, thin and shiny skin -trouble sleeping -weight gain This list may not describe all possible side effects. Call your doctor for medical advice about side effects. You may report side effects to FDA at 1-800-FDA-1088. Where should I keep my medicine? Keep out of the reach of children. Store at room temperature between 15 and 30 degrees C (59 and 86 degrees F). Protect from light. Keep container tightly closed. Throw away any unused medicine after the  expiration date. NOTE: This sheet is a summary. It may not cover all possible information. If you have questions about this medicine, talk to your doctor, pharmacist, or health care provider.  2014, [employer]vier/[employer] Standard. (2011-07-19 10:57:14)  Azithromycin tablets What is this medicine? AZITHROMYCIN (az ith roe MYE sin) is a macrolide antibiotic. It is used to treat or prevent certain kinds of bacterial infections. It will not work for colds, flu, or other viral infections. This medicine may be used for other purposes; ask your health care provider or pharmacist if you have questions. COMMON BRAND NAME(S): Zithromax Tri-Pak, Zithromax Z-Pak, Zithromax What should I tell my health care provider before I take this medicine? They need to know if you have any of these conditions: -kidney disease -liver disease -irregular heartbeat or heart disease -an unusual or allergic reaction to azithromycin, erythromycin, other macrolide antibiotics, foods, dyes, or preservatives -pregnant or trying to get pregnant -breast-feeding How should I use this medicine? Take this medicine by mouth with a full glass of water. Follow the  directions on the prescription label. The tablets can be taken with food or on an empty stomach. If the medicine upsets your stomach, take it with food. Take your medicine at regular intervals. Do not take your medicine more often than directed. Take all of your medicine as directed even if you think your are better. Do not skip doses or stop your medicine early. Talk to your pediatrician regarding the use of this medicine in children. Special care may be needed. Overdosage: If you think you have taken too much of this medicine contact a poison control center or emergency room at once. NOTE: This medicine is only for you. Do not share this medicine with others. What if I miss a dose? If you miss a dose, take it as soon as you can. If it is almost time for your next dose, take only that dose. Do not take double or extra doses. What may interact with this medicine? Do not take this medicine with any of the following medications: -lincomycin This medicine may also interact with the following medications: -amiodarone -antacids -cyclosporine -digoxin -magnesium -nelfinavir -phenytoin -warfarin This list may not describe all possible interactions. Give your health care provider a list of all the medicines, herbs, non-prescription drugs, or dietary supplements you use. Also tell them if you smoke, drink alcohol, or use illegal drugs. Some items may interact with your medicine. What should I watch for while using this medicine? Tell your doctor or health care professional if your symptoms do not improve. Do not treat diarrhea with over the counter products. Contact your doctor if you have diarrhea that lasts more than 2 days or if it is severe and watery. This medicine can make you more sensitive to the sun. Keep out of the sun. If you cannot avoid being in the sun, wear protective clothing and use sunscreen. Do not use sun lamps or tanning beds/booths. What side effects may I notice from receiving  this medicine? Side effects that you should report to your doctor or health care professional as soon as possible: -allergic reactions like skin rash, itching or hives, swelling of the face, lips, or tongue -confusion, nightmares or hallucinations -dark urine -difficulty breathing -hearing loss -irregular heartbeat or chest pain -pain or difficulty passing urine -redness, blistering, peeling or loosening of the skin, including inside the mouth -white patches or sores in the mouth -yellowing of the eyes or skin Side effects that usually do not require medical  attention (report to your doctor or health care professional if they continue or are bothersome): -diarrhea -dizziness, drowsiness -headache -stomach upset or vomiting -tooth discoloration -vaginal irritation This list may not describe all possible side effects. Call your doctor for medical advice about side effects. You may report side effects to FDA at 1-800-FDA-1088. Where should I keep my medicine? Keep out of the reach of children. Store at room temperature between 15 and 30 degrees C (59 and 86 degrees F). Throw away any unused medicine after the expiration date. NOTE: This sheet is a summary. It may not cover all possible information. If you have questions about this medicine, talk to your doctor, pharmacist, or health care provider.  2014, [employer]vier/[employer] Standard. (2013-05-27 15:42:07)

## 2014-01-19 NOTE — Assessment & Plan Note (Signed)
No flu vaccine today as not feeling well, likely next visit

## 2014-01-19 NOTE — Assessment & Plan Note (Signed)
Persistent dry cough x3 weeks and worsening DOE.    -zpack -5 day prednisone 40mg  qd -refilled albuterol and atrovent nebz -hycodan prn (7 day supply) -asked to return if symptoms persist

## 2014-01-19 NOTE — Assessment & Plan Note (Signed)
Has been out of her BCPs'.  Just finished period 2 days prior, reports back to being heavy.   -refilled Sprintec -follow up with GYN advised

## 2014-01-20 NOTE — Progress Notes (Signed)
INTERNAL MEDICINE TEACHING ATTENDING ADDENDUM - Matilde Markie, DO: I reviewed and discussed at the time of visit with the resident Dr. Qureshi, the patient's medical history, physical examination, diagnosis and results of tests and treatment and I agree with the patient's care as documented 

## 2014-01-21 ENCOUNTER — Telehealth: Payer: Self-pay | Admitting: *Deleted

## 2014-01-21 NOTE — Telephone Encounter (Signed)
Call from pt - states she needs another rx for cough syrup; she went to several pharmacies to fill rx for Hydrocodone-Homatropine tabs. They told her they do not have this med in tablet form. Needs new rx.Thanks

## 2014-01-21 NOTE — Telephone Encounter (Signed)
Please call to see if they have the syrup? If not do they have tussionex?  Thanks,  Dr q

## 2014-01-22 NOTE — Telephone Encounter (Signed)
Called [facility] pharmacy - Hydrocodone-Homatropine is available as a syrup; will need another hard copy to give to the pt (d/t Hydrocodone.) Thanks

## 2014-01-25 MED ORDER — HYDROCODONE-HOMATROPINE 5-1.5 MG/5ML PO SYRP: 5.0000 mL | ORAL_SOLUTION | Freq: Four times a day (QID) | ORAL | Status: DC | PRN

## 2014-01-25 NOTE — Telephone Encounter (Signed)
Pt states she still needs the cough medication.

## 2014-01-25 NOTE — Telephone Encounter (Signed)
Called pt; no answer - message left to call the clinic. 

## 2014-01-25 NOTE — Telephone Encounter (Signed)
Done. Please let her know to pick up prescription.

## 2014-01-25 NOTE — Telephone Encounter (Signed)
If she still needs this please let me know, i will write the prescription today.

## 2014-01-28 NOTE — Telephone Encounter (Signed)
Called pt to see if she picked up her rx ; stated she didn't need it.

## 2014-07-06 ENCOUNTER — Encounter: Payer: Self-pay | Admitting: Internal Medicine

## 2014-07-13 ENCOUNTER — Ambulatory Visit (INDEPENDENT_AMBULATORY_CARE_PROVIDER_SITE_OTHER): Payer: Self-pay | Admitting: Internal Medicine

## 2014-07-13 ENCOUNTER — Encounter: Payer: Self-pay | Admitting: Internal Medicine

## 2014-07-13 VITALS — BP 133/89 | HR 101 | Temp 98.2°F | Ht 63.0 in | Wt 244.5 lb

## 2014-07-13 DIAGNOSIS — L282 Other prurigo: Secondary | ICD-10-CM | POA: Insufficient documentation

## 2014-07-13 DIAGNOSIS — I10 Essential (primary) hypertension: Secondary | ICD-10-CM

## 2014-07-13 DIAGNOSIS — Z23 Encounter for immunization: Secondary | ICD-10-CM

## 2014-07-13 DIAGNOSIS — J449 Chronic obstructive pulmonary disease, unspecified: Secondary | ICD-10-CM

## 2014-07-13 DIAGNOSIS — E785 Hyperlipidemia, unspecified: Secondary | ICD-10-CM

## 2014-07-13 DIAGNOSIS — Z0000 Encounter for general adult medical examination without abnormal findings: Secondary | ICD-10-CM

## 2014-07-13 MED ORDER — LISINOPRIL-HYDROCHLOROTHIAZIDE 20-12.5 MG PO TABS: 2.0000 | ORAL_TABLET | Freq: Every day | ORAL | Status: DC

## 2014-07-13 MED ORDER — ALBUTEROL SULFATE HFA 108 (90 BASE) MCG/ACT IN AERS: 2.0000 | INHALATION_SPRAY | Freq: Four times a day (QID) | RESPIRATORY_TRACT | Status: DC | PRN

## 2014-07-13 MED ORDER — HYDROXYZINE HCL 10 MG PO TABS: 10.0000 mg | ORAL_TABLET | Freq: Three times a day (TID) | ORAL | Status: DC | PRN

## 2014-07-13 MED ORDER — LOVASTATIN 20 MG PO TABS
40.0000 mg | ORAL_TABLET | Freq: Every day | ORAL | Status: DC
Start: 2014-07-13 — End: 2015-03-01

## 2014-07-13 NOTE — Patient Instructions (Signed)
General Instructions:  Randie HeinzGreat job on your blood pressure!  Tdap vaccine today  Mammogram scholarship information given today  Medication refills sent to [facility]  Lets try atarax up to three times a day 10 mg first for the itching and anxiety and see if that helps. If too expensive or cannot afford then the benadryl you are using should be okay.   Please bring your medicines with you each time you come to clinic.  Medicines may include prescription medications, over-the-counter medications, herbal remedies, eye drops, vitamins, or other pills.   Progress Toward Treatment Goals:  Treatment Goal 07/13/2014  Blood pressure at goal    Self Care Goals & Plans:  Self Care Goal 01/19/2014  Manage my medications take my medicines as prescribed; bring my medications to every visit; refill my medications on time  Monitor my health -  Eat healthy foods eat foods that are low in salt; eat baked foods instead of fried foods; eat smaller portions  Be physically active (No Data)  Meeting treatment goals -    No flowsheet data found.   Care Management & Community Referrals:  No flowsheet data found.    Hydroxyzine capsules or tablets What is this medicine? HYDROXYZINE (hye DROX i zeen) is an antihistamine. This medicine is used to treat allergy symptoms. It is also used to treat anxiety and tension. This medicine can be used with other medicines to induce sleep before surgery. This medicine may be used for other purposes; ask your health care provider or pharmacist if you have questions. COMMON BRAND NAME(S): ANX, Atarax, Rezine, Vistaril What should I tell my health care provider before I take this medicine? They need to know if you have any of these conditions: -any chronic illness -difficulty passing urine -glaucoma -heart disease -kidney disease -liver disease -lung disease -an unusual or allergic reaction to hydroxyzine, cetirizine, other medicines, foods, dyes, or  preservatives -pregnant or trying to get pregnant -breast-feeding How should I use this medicine? Take this medicine by mouth with a full glass of water. Follow the directions on the prescription label. You may take this medicine with food or on an empty stomach. Take your medicine at regular intervals. Do not take your medicine more often than directed. Talk to your pediatrician regarding the use of this medicine in children. Special care may be needed. While this drug may be prescribed for children as young as 46 years of age for selected conditions, precautions do apply. Patients over 47 years old may have a stronger reaction and need a smaller dose. Overdosage: If you think you have taken too much of this medicine contact a poison control center or emergency room at once. NOTE: This medicine is only for you. Do not share this medicine with others. What if I miss a dose? If you miss a dose, take it as soon as you can. If it is almost time for your next dose, take only that dose. Do not take double or extra doses. What may interact with this medicine? -alcohol -barbiturate medicines for sleep or seizures -medicines for colds, allergies -medicines for depression, anxiety, or emotional disturbances -medicines for pain -medicines for sleep -muscle relaxants This list may not describe all possible interactions. Give your health care provider a list of all the medicines, herbs, non-prescription drugs, or dietary supplements you use. Also tell them if you smoke, drink alcohol, or use illegal drugs. Some items may interact with your medicine. What should I watch for while using this medicine? Tell your  doctor or health care professional if your symptoms do not improve. You may get drowsy or dizzy. Do not drive, use machinery, or do anything that needs mental alertness until you know how this medicine affects you. Do not stand or sit up quickly, especially if you are an older patient. This reduces the  risk of dizzy or fainting spells. Alcohol may interfere with the effect of this medicine. Avoid alcoholic drinks. Your mouth may get dry. Chewing sugarless gum or sucking hard candy, and drinking plenty of water may help. Contact your doctor if the problem does not go away or is severe. This medicine may cause dry eyes and blurred vision. If you wear contact lenses you may feel some discomfort. Lubricating drops may help. See your eye doctor if the problem does not go away or is severe. If you are receiving skin tests for allergies, tell your doctor you are using this medicine. What side effects may I notice from receiving this medicine? Side effects that you should report to your doctor or health care professional as soon as possible: -fast or irregular heartbeat -difficulty passing urine -seizures -slurred speech or confusion -tremor Side effects that usually do not require medical attention (report to your doctor or health care professional if they continue or are bothersome): -constipation -drowsiness -fatigue -headache -stomach upset This list may not describe all possible side effects. Call your doctor for medical advice about side effects. You may report side effects to FDA at 1-800-FDA-1088. Where should I keep my medicine? Keep out of the reach of children. Store at room temperature between 15 and 30 degrees C (59 and 86 degrees F). Keep container tightly closed. Throw away any unused medicine after the expiration date. NOTE: This sheet is a summary. It may not cover all possible information. If you have questions about this medicine, talk to your doctor, pharmacist, or health care provider.  2015, Elsevier/Gold Standard. (2008-04-16 14:50:59)

## 2014-07-13 NOTE — Progress Notes (Signed)
Mammogram scholarship application faxed to [facility].

## 2014-07-13 NOTE — Assessment & Plan Note (Addendum)
Mammogram scholarship material given today tdap given today papsmear in january

## 2014-07-13 NOTE — Assessment & Plan Note (Signed)
Refilled statin

## 2014-07-13 NOTE — Assessment & Plan Note (Signed)
Needs pft's but cannot afford at this time. No respiratory distress or complaints lately. Has not been needing her inhalers or nebulizer's.

## 2014-07-13 NOTE — Assessment & Plan Note (Signed)
Intermittent, worse on arms R>L, worse with anxiety and in heat with sweat. Benadryl helps, mild relief with cortisone cream.   -will do trial of atarax if she can afford, start with 10mg  tid prn -recommended non-scented lotions, try calamine -if cannot afford atarax, continue prn benadryl

## 2014-07-13 NOTE — Assessment & Plan Note (Signed)
BP Readings from Last 3 Encounters:  07/13/14 133/89  01/19/14 157/112  06/01/13 136/99   Lab Results  Component Value Date   NA 138 11/17/2012   K 3.6 11/17/2012   CREATININE 0.81 11/17/2012   Assessment: Blood pressure control: controlled Progress toward BP goal:  at goal  Plan: Medications:  continue current medications prinzide 2 tablets qd

## 2014-07-13 NOTE — Progress Notes (Signed)
Subjective:   Patient ID: [name] female   DOB: October 06, 1966 48 y.o.   MRN: [id number]  HPI: [name] is a 48 y.o. female with dx of COPD but no PFTs and well controlled HTN presenting to opc today for routine visit.   HTN--well controlled. On Prinzide and compliant. Needs refills.   GERD--now on nexium, tolerates well. Wishes to stay on it despite her hearing of some side-effects on the radio but she says it works best and she doesn't wish to stop. She will consider if she has any side effects.   Pruritic intermittent rash--R arm more than left, worse in heat and with anxiety. Benadryl helps, she tries to keep moisturized. Denies exposure to bug bites, scabies, or bed bugs. Hx of anxiety related rashes in her family she claims.   No insurance and did not qualify for orange card.   Past Medical History  Diagnosis Date  . COPD (chronic obstructive pulmonary disease)     non-oxygen dependent  . Hypertension   . GERD (gastroesophageal reflux disease)    Current Outpatient Prescriptions  Medication Sig Dispense Refill  . diphenhydrAMINE (BENADRYL) 25 mg capsule Take 50-75 mg by mouth at bedtime as needed. For sleep      . lisinopril-hydrochlorothiazide (PRINZIDE) 20-12.5 MG per tablet Take 2 tablets by mouth daily.  60 tablet  5  . lovastatin (MEVACOR) 20 MG tablet Take 2 tablets (40 mg total) by mouth at bedtime.  60 tablet  5  . Multiple Vitamin (MULTIVITAMIN) tablet Take 1 tablet by mouth daily.      . norgestimate-ethinyl estradiol (SPRINTEC 28) 0.25-35 MG-MCG tablet Take 1 tablet by mouth daily.  1 Package  11  . albuterol (PROVENTIL HFA;VENTOLIN HFA) 108 (90 BASE) MCG/ACT inhaler Inhale 2 puffs into the lungs every 6 (six) hours as needed for shortness of breath. For SOB  1 Inhaler  5  . albuterol (PROVENTIL) (2.5 MG/3ML) 0.083% nebulizer solution Take 3 mLs (2.5 mg total) by nebulization every 6 (six) hours as needed for wheezing or shortness of breath.  75 mL  12  .  hydrOXYzine (ATARAX/VISTARIL) 10 MG tablet Take 1 tablet (10 mg total) by mouth 3 (three) times daily as needed.  30 tablet  0  . ipratropium (ATROVENT) 0.02 % nebulizer solution Take 2.5 mLs (0.5 mg total) by nebulization every 4 (four) hours as needed for wheezing or shortness of breath.  75 mL  2   No current facility-administered medications for this visit.   Family History  Problem Relation Age of Onset  . Hypertension Mother   . Stroke Mother   . Hypertension Father   . Heart disease Father   . COPD Sister   . Heart disease Brother   . Hypertension Brother   . Cancer Maternal Aunt   . Cancer Paternal Aunt   . Cancer Maternal Grandmother   . Heart disease Maternal Grandfather   . Hypertension Maternal Grandfather   . Heart disease Paternal Grandmother   . Diabetes Paternal Grandmother   . Heart disease Paternal Grandfather   . Hypertension Paternal Grandfather    History   Social History  . Marital Status: Married    Spouse Name: N/A    Number of Children: 5  . Years of Education: 10th grade   Occupational History  . unemployed    Social History Main Topics  . Smoking status: Former Smoker -- 1.00 packs/day for 25 years    Types: Cigarettes  Quit date: 01/05/2008  . Smokeless tobacco: Never Used  . Alcohol Use: No  . Drug Use: No  . Sexual Activity: None   Other Topics Concern  . None   Social History Narrative   Lives in [place] with husband and extended family.   Review of Systems:  Constitutional:  Denies fever, chills  HEENT:  Denies congestion  Respiratory:  Denies SOB  Cardiovascular:  Denies chest pain  Gastrointestinal:  Denies nausea, vomiting, abdominal pain   Genitourinary:  Denies dysuria  Musculoskeletal:  Denies gait problem.   Skin:  Pruritic intermittent rash on arms, R>L  Neurological:  Denies dizziness   Objective:  Physical Exam: Filed Vitals:   07/13/14 1444  BP: 133/89  Pulse: 101  Temp: 98.2 F (36.8 C)  TempSrc:  Oral  Height: 5\' 3"  (1.6 m)  Weight: 244 lb 8 oz (110.904 kg)  SpO2: 98%   Vitals reviewed. General: sitting in chair, NAD HEENT: EOMI, long hair Cardiac: tachycardia Pulm: clear to auscultation bilaterally Abd: soft, obese, nontender, nondistended, BS present, +stretch marks present Ext: warm and well perfused, no pedal edema, +tattoo on foot, +abrasions and slight red pruritic bumps on R upper extremity, a few in between 3rd and 4th finger of left hand but no obvious tracks on skin or in webs of fingers. Few abrasions seen on left arm but less.  Neuro: alert and oriented X3, strength and sensation to light touch equal in bilateral upper and lower extremities  Assessment & Plan:  Discussed with Dr. Heide Spark Atarax prn for itching and anxiety Mammogram scholarship material provided Medications refilled

## 2014-07-13 NOTE — Assessment & Plan Note (Signed)
Continues to gain weight. Discussed diet and exercise again today.

## 2014-07-14 NOTE — Progress Notes (Signed)
INTERNAL MEDICINE TEACHING ATTENDING ADDENDUM - Vita Currin, MD: I reviewed and discussed at the time of visit with the resident Dr. Qureshi, the patient's medical history, physical examination, diagnosis and results of pertinent tests and treatment and I agree with the patient's care as documented.  

## 2014-08-17 ENCOUNTER — Other Ambulatory Visit: Payer: Self-pay | Admitting: Internal Medicine

## 2014-08-17 DIAGNOSIS — Z1231 Encounter for screening mammogram for malignant neoplasm of breast: Secondary | ICD-10-CM

## 2014-08-26 ENCOUNTER — Ambulatory Visit (HOSPITAL_COMMUNITY)
Admission: RE | Admit: 2014-08-26 | Discharge: 2014-08-26 | Disposition: A | Discharge status: Home / Self Care | Payer: Self-pay | Source: Ambulatory Visit | Attending: Internal Medicine | Admitting: Internal Medicine

## 2014-08-26 DIAGNOSIS — Z1231 Encounter for screening mammogram for malignant neoplasm of breast: Secondary | ICD-10-CM

## 2015-01-31 ENCOUNTER — Encounter: Payer: Self-pay | Admitting: Internal Medicine

## 2015-02-04 ENCOUNTER — Other Ambulatory Visit: Payer: Self-pay | Admitting: *Deleted

## 2015-02-04 MED ORDER — LISINOPRIL-HYDROCHLOROTHIAZIDE 20-12.5 MG PO TABS: 2.0000 | ORAL_TABLET | Freq: Every day | ORAL | Status: DC

## 2015-03-01 ENCOUNTER — Ambulatory Visit (INDEPENDENT_AMBULATORY_CARE_PROVIDER_SITE_OTHER): Payer: Self-pay | Admitting: Internal Medicine

## 2015-03-01 ENCOUNTER — Encounter: Payer: Self-pay | Admitting: Internal Medicine

## 2015-03-01 VITALS — BP 134/83 | HR 98 | Temp 97.6°F | Ht 62.0 in | Wt 247.4 lb

## 2015-03-01 DIAGNOSIS — N920 Excessive and frequent menstruation with regular cycle: Secondary | ICD-10-CM

## 2015-03-01 DIAGNOSIS — J45909 Unspecified asthma, uncomplicated: Secondary | ICD-10-CM

## 2015-03-01 DIAGNOSIS — I10 Essential (primary) hypertension: Secondary | ICD-10-CM

## 2015-03-01 DIAGNOSIS — Z23 Encounter for immunization: Secondary | ICD-10-CM

## 2015-03-01 DIAGNOSIS — M25552 Pain in left hip: Secondary | ICD-10-CM

## 2015-03-01 DIAGNOSIS — E785 Hyperlipidemia, unspecified: Secondary | ICD-10-CM

## 2015-03-01 DIAGNOSIS — Z0000 Encounter for general adult medical examination without abnormal findings: Secondary | ICD-10-CM

## 2015-03-01 MED ORDER — LOVASTATIN 20 MG PO TABS: 40.0000 mg | ORAL_TABLET | Freq: Every day | ORAL | Status: DC

## 2015-03-01 NOTE — Assessment & Plan Note (Signed)
Did not get lovastatin refilled from pharmacy.   -Refil sent to pharmacy again today, only affordable option for her at this time but she would like to continue it at this time -also need to work on lifestyle modifications and weight loss

## 2015-03-01 NOTE — Assessment & Plan Note (Signed)
Weight trending up.   Discussed weight loss today and the importance of it. Recommend trying to work on diet and exercise as tolerated although left hip pain is making exercise difficult

## 2015-03-01 NOTE — Assessment & Plan Note (Addendum)
Chronic but slowly worsening over time. Worse when lying on left side or with walking. Tender to palpation on exam and on passive movements. Strength and sensation intact and denies numbness or tingling.   Differentials are broad: could be trochanteric bursitis given description of pain vs. OA of hip. She is obese thus possible meralgia paresthetica, however, she denies any paresthesias and pain radiates more to groin region. Of note, she does have a soft palpable area that is tender along left groin line, unclear what it is but given location lymphadenopathy seems unlikely. ?lipoma (she does have lipomas in other locations).   -will start with L hip xray -she has relief with aleve, will continue PRN -pending xray results and monitoring of pain, may benefit from sports medicine referral and/or injection therapy and/or PT. Once she has insurance, we can try to make the referral if appropriate.  -Advised to monitor left hip area and palpable raised area. If increasing would consider ultrasound of area

## 2015-03-01 NOTE — Assessment & Plan Note (Signed)
Refused HIV screening today. Refused papsmear today but willing to on next visit or in pap clinic if available.

## 2015-03-01 NOTE — Assessment & Plan Note (Signed)
Now off BCPs per choice. Reports regular cycles and less heavy menses than before. Has not follows up with GYN recently but does not have insurance right now and limited finances.   -offered papsmear today but she refused -recommend cbc on follow up visit -GYN referral when able

## 2015-03-01 NOTE — Assessment & Plan Note (Signed)
Occasionally needs albuterol inhaler prn but cannot currently afford. Has not been able to get PFTs done due to affordability and lack of insurance. Also concern for possible OSA given that she reports snoring a lot at night and not always sleeping great.   -ventolin inhaler sample provided for patient today -PFTs and sleep study when able with insurance/affordability -continue nebulizer treatments prn

## 2015-03-01 NOTE — Assessment & Plan Note (Signed)
BP Readings from Last 3 Encounters:  03/01/15 134/83  07/13/14 133/89  01/19/14 157/112   Lab Results  Component Value Date   NA 138 11/17/2012   K 3.6 11/17/2012   CREATININE 0.81 11/17/2012   Assessment: Blood pressure control: controlled Progress toward BP goal:  at goal  Plan: Medications:  continue current medications prinzide 40-25mg  daily Educational resources provided:   Self management tools provided:   Other plans: bmet next visit, last one was in 2013. She has limited finances at this time and no insurance currently. Hopefully will have some by the time of next visit that can make tests more affordable for her at that time
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
## 2015-03-01 NOTE — Patient Instructions (Signed)
General Instructions:   Please bring your medicines with you each time you come to clinic.  Medicines may include prescription medications, over-the-counter medications, herbal remedies, eye drops, vitamins, or other pills.  Dear [name],  Thank you for coming in today.   Please get your hip xray done and also let us know right away once you get insurance.   You will get your flu shot today  You can continue to take your aleve for the hip pain, do not exceed more than 1000mg  a day. You can try 250mg  every 6-8 hours to start when you have the pain.  Please work on weight loss  Return when you have your insurance, hopefully by 3 months or sooner if pain worsening or does not improve.  You have decided not to get papsmear today, we will try to get you in to the pap clinic or on next visit please consider.    Progress Toward Treatment Goals:  Treatment Goal 03/01/2015  Blood pressure at goal    Self Care Goals & Plans:  Self Care Goal 03/01/2015  Manage my medications take my medicines as prescribed; bring my medications to every visit; refill my medications on time  Monitor my health -  Eat healthy foods eat foods that are low in salt; eat more vegetables; eat baked foods instead of fried foods  Be physically active find an activity I enjoy; take a walk every day  Meeting treatment goals -    No flowsheet data found.   Care Management & Community Referrals:  No flowsheet data found.

## 2015-03-04 NOTE — Progress Notes (Signed)
Internal Medicine Clinic Attending  Case discussed with Dr. Qureshi soon after the resident saw the patient.  We reviewed the resident's history and exam and pertinent patient test results.  I agree with the assessment, diagnosis, and plan of care documented in the resident's note. 

## 2015-04-05 ENCOUNTER — Other Ambulatory Visit: Payer: Self-pay | Admitting: Internal Medicine

## 2015-04-28 ENCOUNTER — Encounter: Payer: Self-pay | Admitting: *Deleted

## 2015-04-30 ENCOUNTER — Other Ambulatory Visit: Payer: Self-pay | Admitting: Internal Medicine

## 2015-05-03 ENCOUNTER — Other Ambulatory Visit: Payer: Self-pay | Admitting: Internal Medicine

## 2015-05-03 NOTE — Telephone Encounter (Signed)
Do i order this as 75mL? 75 each is not an option  Thanks,  Dr q

## 2015-05-03 NOTE — Telephone Encounter (Signed)
Is patient still using these?

## 2015-05-05 NOTE — Telephone Encounter (Signed)
1 vial is 2.545ml, may use 4 vials daily=10.ml daily=30 days worth 300ml, 90 days worth=910800ml

## 2015-06-14 ENCOUNTER — Emergency Department (HOSPITAL_COMMUNITY)
Admission: EM | Admit: 2015-06-14 | Discharge: 2015-06-14 | Disposition: A | Discharge status: Home / Self Care | Payer: Self-pay | Attending: Emergency Medicine | Admitting: Emergency Medicine

## 2015-06-14 ENCOUNTER — Encounter (HOSPITAL_COMMUNITY): Payer: Self-pay | Admitting: *Deleted

## 2015-06-14 ENCOUNTER — Emergency Department (HOSPITAL_COMMUNITY): Payer: Self-pay

## 2015-06-14 DIAGNOSIS — J449 Chronic obstructive pulmonary disease, unspecified: Secondary | ICD-10-CM | POA: Insufficient documentation

## 2015-06-14 DIAGNOSIS — Z8719 Personal history of other diseases of the digestive system: Secondary | ICD-10-CM | POA: Insufficient documentation

## 2015-06-14 DIAGNOSIS — W208XXA Other cause of strike by thrown, projected or falling object, initial encounter: Secondary | ICD-10-CM | POA: Insufficient documentation

## 2015-06-14 DIAGNOSIS — S92912A Unspecified fracture of left toe(s), initial encounter for closed fracture: Secondary | ICD-10-CM

## 2015-06-14 DIAGNOSIS — I10 Essential (primary) hypertension: Secondary | ICD-10-CM | POA: Insufficient documentation

## 2015-06-14 DIAGNOSIS — Y9289 Other specified places as the place of occurrence of the external cause: Secondary | ICD-10-CM | POA: Insufficient documentation

## 2015-06-14 DIAGNOSIS — Y998 Other external cause status: Secondary | ICD-10-CM | POA: Insufficient documentation

## 2015-06-14 DIAGNOSIS — S92412A Displaced fracture of proximal phalanx of left great toe, initial encounter for closed fracture: Secondary | ICD-10-CM | POA: Insufficient documentation

## 2015-06-14 DIAGNOSIS — Y9389 Activity, other specified: Secondary | ICD-10-CM | POA: Insufficient documentation

## 2015-06-14 DIAGNOSIS — Z87891 Personal history of nicotine dependence: Secondary | ICD-10-CM | POA: Insufficient documentation

## 2015-06-14 DIAGNOSIS — Z79899 Other long term (current) drug therapy: Secondary | ICD-10-CM | POA: Insufficient documentation

## 2015-06-14 MED ORDER — HYDROCODONE-ACETAMINOPHEN 5-325 MG PO TABS: 1.0000 | ORAL_TABLET | ORAL | Status: DC | PRN

## 2015-06-14 NOTE — ED Notes (Signed)
Pt does not want toes buddy taped; Irving BurtonEmily, [place] aware

## 2015-06-14 NOTE — ED Notes (Signed)
Pt reports she dropped a table on Lt foot last night at 1800. Pt now reports pain to Lt foot 8/10 when walking.

## 2015-06-14 NOTE — ED Provider Notes (Signed)
CSN: [id number]     Arrival date & time 06/14/15  1038 History  This chart was scribed for non-physician practitioner, Rachel Dredge, PA-C, working with Rachel Mocha, MD by [name], ED Scribe. This patient was seen in room TR09C/TR09C and the patient's care was started at 12:46 PM.   Chief Complaint  Patient presents with  . Foot Pain   HPI PCP: Darden Palmer, MD HPI Comments: [name] is a 49 y.o. female who presents to the Emergency Department complaining of traumatic, 8/10, throbbing left foot pain with associated swelling and decreased ROM of left big toe onset last night after pt accidentally dropped a table on her foot. Pt notes the pain is worse in her left big toe. Pt reports taking Aleve at home with minimal relief. Pt denies fever, chills, numbness, tingling, decreased sensation of left foot. Denies other injury.    Past Medical History  Diagnosis Date  . COPD (chronic obstructive pulmonary disease)     non-oxygen dependent  . Hypertension   . GERD (gastroesophageal reflux disease)    Past Surgical History  Procedure Laterality Date  . Tubal ligation    . Colonoscopy  11/19/2012    Procedure: COLONOSCOPY;  Surgeon: [name]., MD;  Location: [facility] ENDOSCOPY;  Service: Endoscopy;  Laterality: N/A;   Family History  Problem Relation Age of Onset  . Hypertension Mother   . Stroke Mother   . Hypertension Father   . Heart disease Father   . COPD Sister   . Heart disease Brother   . Hypertension Brother   . Cancer Maternal Aunt   . Cancer Paternal Aunt   . Cancer Maternal Grandmother   . Heart disease Maternal Grandfather   . Hypertension Maternal Grandfather   . Heart disease Paternal Grandmother   . Diabetes Paternal Grandmother   . Heart disease Paternal Grandfather   . Hypertension Paternal Grandfather    History  Substance Use Topics  . Smoking status: Former Smoker -- 1.00 packs/day for 25 years    Types: Cigarettes    Quit date: 01/05/2008  .  Smokeless tobacco: Never Used  . Alcohol Use: No   OB History    No data available     Review of Systems  Constitutional: Negative for fever.  Cardiovascular: Negative for leg swelling.  Musculoskeletal: Positive for joint swelling and arthralgias.       Left foot pain and swelling   Skin: Positive for color change.  Allergic/Immunologic: Negative for immunocompromised state.  Neurological: Negative for weakness and numbness.  Hematological: Does not bruise/bleed easily.  Psychiatric/Behavioral: Negative for self-injury (accidental).   Allergies  Sulfa drugs cross reactors  Home Medications   Prior to Admission medications   Medication Sig Start Date End Date Taking? Authorizing Provider  albuterol (PROVENTIL HFA;VENTOLIN HFA) 108 (90 BASE) MCG/ACT inhaler Inhale 2 puffs into the lungs every 6 (six) hours as needed for shortness of breath. For SOB Patient not taking: Reported on 03/01/2015 07/13/14   Baltazar Apo, MD  albuterol (PROVENTIL) (2.5 MG/3ML) 0.083% nebulizer solution USE ONE VIAL IN NEBULIZER EVERY 6 HOURS AS NEEDED FOR  WHEEZING  OR  SHORTNESS  OF  BREATH 05/03/15   Baltazar Apo, MD  ipratropium (ATROVENT) 0.02 % nebulizer solution Take 2.5 mLs (0.5 mg total) by nebulization every 6 (six) hours as needed for wheezing or shortness of breath. 05/05/15   Baltazar Apo, MD  lisinopril-hydrochlorothiazide (PRINZIDE,ZESTORETIC) 20-12.5 MG per tablet TAKE TWO TABLETS BY  MOUTH DAILY. 04/06/15   Baltazar ApoSamaya J Qureshi, MD  lovastatin (MEVACOR) 20 MG tablet Take 2 tablets (40 mg total) by mouth at bedtime. 03/01/15   Baltazar ApoSamaya J Qureshi, MD  Multiple Vitamin (MULTIVITAMIN) tablet Take 1 tablet by mouth daily.    Historical Provider, MD   Triage Vitals: BP 129/79 mmHg  Pulse 97  Temp(Src) 97.9 F (36.6 C) (Oral)  Resp 18  Ht 5\' 3"  (1.6 m)  Wt 230 lb (104.327 kg)  BMI 40.75 kg/m2  SpO2 100%  LMP 06/07/2015 Physical Exam  Constitutional: She appears well-developed and  well-nourished. No distress.  HENT:  Head: Normocephalic and atraumatic.  Neck: Neck supple.  Pulmonary/Chest: Effort normal.  Musculoskeletal:  Left Foot: Edema and ecchymosis with diffuse tenderness over the proximal great toe of the left foot. Distal sensations intact. Cap refill less than 2 seconds. ecchymosis extends into forefoot. No other bony tenderness throughout the foot. No break in the skin.   Neurological: She is alert.  Skin: She is not diaphoretic.  Nursing note and vitals reviewed.   ED Course  Procedures (including critical care time) DIAGNOSTIC STUDIES: Oxygen Saturation is 100% on RA, nl by my interpretation.    COORDINATION OF CARE: 12:48 PM-Discussed treatment plan which includes post op shoe, buddy tape and pain meds with pt at bedside and pt agreed to plan. Pt also offered crutches, but she declined stating she has crut    Labs Review Labs Reviewed - No data to display  Imaging Review Dg Foot Complete Left  06/14/2015   CLINICAL DATA:  Left foot pain and bruising, dropped table on the left foot last night  EXAM: LEFT FOOT - COMPLETE 3+ VIEW  COMPARISON:  07/02/2010.  FINDINGS: Three views of the left foot submitted. There is minimal displaced oblique fracture distal aspect of proximal phalanx great toe.  IMPRESSION: Minimal displaced oblique fracture distal aspect of proximal phalanx left great toe.   Electronically Signed   By: [name]  Pop M.D.   On: 06/14/2015 12:38     EKG Interpretation None      MDM   Final diagnoses:  Toe fracture, left, closed, initial encounter    Afebrile, nontoxic patient with injury to her left great toe after accidentally dropping table on it yesterday.   Xray proximal phalanx fracture.  No break in skin.  Neurovascularly intact.   D/C home with post op shoe, crutches, pain medication.   Discussed result, findings, treatment, and follow up  with patient.  Pt given return precautions.  Pt verbalizes understanding and agrees  with plan.      I personally performed the services described in this documentation, which was scribed in my presence. The recorded information has been reviewed and is accurate.     Rachel Dredgemily Daison Braxton, PA-C 06/14/15 1737  Rachel MochaBlair Walden, MD 06/15/15 [phone]

## 2015-06-14 NOTE — Discharge Instructions (Signed)
Read the information below.  Use the prescribed medication as directed.  Please discuss all new medications with your pharmacist.  Do not take additional tylenol while taking the prescribed pain medication to avoid overdose.  You may return to the Emergency Department at any time for worsening condition or any new symptoms that concern you.   If you develop uncontrolled pain, weakness or numbness of the extremity, severe discoloration of the skin, or you are unable to walk or move your toe, return to the ER for a recheck.      Buddy Taping of Toes We have taped your toes together to keep them from moving. This is called "buddy taping" since we used a part of your own body to keep the injured part still. We placed soft padding between your toes to keep them from rubbing against each other. Buddy taping will help with healing and to reduce pain. Keep your toes buddy taped together for as long as directed by your caregiver. HOME CARE INSTRUCTIONS   Raise your injured area above the level of your heart while sitting or lying down. Prop it up with pillows.  An ice pack used every twenty minutes, while awake, for the first one to two days may be helpful. Put ice in a plastic bag and put a towel between the bag and your skin.  Watch for signs that the taping is too tight. These signs may be:  Numbness of your taped toes.  Coolness of your taped toes.  Color change in the area beyond the tape.  Increased pain.  If you have any of these signs, loosen or rewrap the tape. If you need to loosen or rewrap the buddy tape, make sure you use the padding again. SEEK IMMEDIATE MEDICAL CARE IF:   You have worse pain, swelling, inflammation (soreness), drainage or bleeding after you rewrap the tape.  Any new problems occur. MAKE SURE YOU:   Understand these instructions.  Will watch your condition.  Will get help right away if you are not doing well or get worse. Document Released: 09/06/2004 Document  Revised: 02/25/2012 Document Reviewed: 11/30/2008 [facility] Patient Information 2015 [place], [place]. This information is not intended to replace advice given to you by your health care provider. Make sure you discuss any questions you have with your health care provider.  Toe Fracture Your caregiver has diagnosed you as having a fractured toe. A toe fracture is a break in the bone of a toe. "Buddy taping" is a way of splinting your broken toe, by taping the broken toe to the toe next to it. This "buddy taping" will keep the injured toe from moving beyond normal range of motion. Buddy taping also helps the toe heal in a more normal alignment. It may take 6 to 8 weeks for the toe injury to heal. HOME CARE INSTRUCTIONS   Leave your toes taped together for as long as directed by your caregiver or until you see a doctor for a follow-up examination. You can change the tape after bathing. Always use a small piece of gauze or cotton between the toes when taping them together. This will help the skin stay dry and prevent infection.  Apply ice to the injury for 15-20 minutes each hour while awake for the first 2 days. Put the ice in a plastic bag and place a towel between the bag of ice and your skin.  After the first 2 days, apply heat to the injured area. Use heat for the next  2 to 3 days. Place a heating pad on the foot or soak the foot in warm water as directed by your caregiver.  Keep your foot elevated as much as possible to lessen swelling.  Wear sturdy, supportive shoes. The shoes should not pinch the toes or fit tightly against the toes.  Your caregiver may prescribe a rigid shoe if your foot is very swollen.  Your may be given crutches if the pain is too great and it hurts too much to walk.  Only take over-the-counter or prescription medicines for pain, discomfort, or fever as directed by your caregiver.  If your caregiver has given you a follow-up appointment, it is very important to keep  that appointment. Not keeping the appointment could result in a chronic or permanent injury, pain, and disability. If there is any problem keeping the appointment, you must call back to this facility for assistance. SEEK MEDICAL CARE IF:   You have increased pain or swelling, not relieved with medications.  The pain does not get better after 1 week.  Your injured toe is cold when the others are warm. SEEK IMMEDIATE MEDICAL CARE IF:   The toe becomes cold, numb, or white.  The toe becomes hot (inflamed) and red. Document Released: 11/30/2000 Document Revised: 02/25/2012 Document Reviewed: 07/19/2008 [facility] Patient Information 2015 [place]. This information is not intended to replace advice given to you by your health care provider. Make sure you discuss any questions you have with your health care provider.

## 2015-08-09 ENCOUNTER — Other Ambulatory Visit: Payer: Self-pay | Admitting: Internal Medicine

## 2015-08-09 DIAGNOSIS — I10 Essential (primary) hypertension: Secondary | ICD-10-CM

## 2015-08-09 NOTE — Telephone Encounter (Signed)
Patient requesting medication refill on her Lisinopril.  Pt uses [facility] on [address].

## 2015-09-14 ENCOUNTER — Other Ambulatory Visit: Payer: Self-pay | Admitting: Internal Medicine

## 2015-09-14 DIAGNOSIS — I10 Essential (primary) hypertension: Secondary | ICD-10-CM

## 2015-09-14 NOTE — Telephone Encounter (Signed)
Will need BMET at office visit with my on 10/5 (last was 2013).

## 2015-09-16 ENCOUNTER — Other Ambulatory Visit: Payer: Self-pay | Admitting: Internal Medicine

## 2015-09-16 NOTE — Telephone Encounter (Signed)
Med has been refilled Pt informed

## 2015-09-16 NOTE — Telephone Encounter (Signed)
Pt called requesting lisinopril to be filled @ [facility].

## 2015-09-21 ENCOUNTER — Encounter: Payer: Self-pay | Admitting: Internal Medicine

## 2015-09-21 ENCOUNTER — Ambulatory Visit (INDEPENDENT_AMBULATORY_CARE_PROVIDER_SITE_OTHER): Payer: Self-pay | Admitting: Internal Medicine

## 2015-09-21 VITALS — BP 124/87 | HR 86 | Temp 97.7°F | Ht 63.0 in | Wt 236.6 lb

## 2015-09-21 DIAGNOSIS — I10 Essential (primary) hypertension: Secondary | ICD-10-CM

## 2015-09-21 DIAGNOSIS — Z23 Encounter for immunization: Secondary | ICD-10-CM

## 2015-09-21 DIAGNOSIS — Z0000 Encounter for general adult medical examination without abnormal findings: Secondary | ICD-10-CM

## 2015-09-21 DIAGNOSIS — E785 Hyperlipidemia, unspecified: Secondary | ICD-10-CM

## 2015-09-21 MED ORDER — LISINOPRIL-HYDROCHLOROTHIAZIDE 20-12.5 MG PO TABS: 2.0000 | ORAL_TABLET | Freq: Every day | ORAL | Status: DC

## 2015-09-21 NOTE — Progress Notes (Signed)
Medicine attending: I personally interviewed and briefly examined this patient, and reviewed pertinent clinical laboratory  data  with resident physician Dr.Jeremy Ford and we discussed a   management plan. 

## 2015-09-21 NOTE — Assessment & Plan Note (Signed)
Blood pressure is very well controlled at 124/87. She is adherent with prinzide 40-25 mg and is tolerating the medication well.  - Refill prinzide - BMET today

## 2015-09-21 NOTE — Assessment & Plan Note (Signed)
Has not been taking Lovastatin due to cost issues. I calculated her ASCVD score using a previous lipid panel, and her 10 year cardiac risk is 2.9% - Lipid panel today - Removed Lovastatin from med list - Encouraged exercise and weight loss

## 2015-09-21 NOTE — Assessment & Plan Note (Addendum)
Agreed to HIV screening today. Not interested in pap smear today. - Flu shot given

## 2015-09-21 NOTE — Patient Instructions (Signed)
Great to meet you today. You are in excellent health. Based on your risk, you do not need a statin or aspirin. We're also doing some general screening, and we'll call if there are any problems. Have a great day.

## 2015-09-21 NOTE — Progress Notes (Signed)
   Subjective:    Patient ID: [name], female    DOB: [birth year], 49 y.o.   MRN: [id number]  HPI  [name] comes to the clinic today with no new complaints and would just like her blood pressure medication refilled.   Review of Systems  Respiratory: Negative for shortness of breath.   Cardiovascular: Negative for chest pain and leg swelling.  Neurological: Negative for headaches.       Objective:   Physical Exam  Constitutional: She appears well-developed and well-nourished. No distress.  HENT:  Mouth/Throat: Oropharynx is clear and moist.  Eyes: EOM are normal. Pupils are equal, round, and reactive to light.  Cardiovascular: Normal rate and regular rhythm.   No murmur heard. Pulmonary/Chest: Effort normal and breath sounds normal. No respiratory distress. She has no wheezes.  Abdominal: Soft. Bowel sounds are normal. She exhibits no distension.  Musculoskeletal: She exhibits no edema.  Neurological: She is alert. She displays normal reflexes.  Skin: Skin is warm and dry.  Psychiatric: She has a normal mood and affect.          Assessment & Plan:  Please see problem-based assessment and plan for details.

## 2015-09-22 LAB — BMP8+ANION GAP
ANION GAP: 18 mmol/L (ref 10.0–18.0)
BUN/Creatinine Ratio: 13 (ref 9–23)
BUN: 11 mg/dL (ref 6–24)
CALCIUM: 9.4 mg/dL (ref 8.7–10.2)
CO2: 23 mmol/L (ref 18–29)
CREATININE: 0.83 mg/dL (ref 0.57–1.00)
Chloride: 97 mmol/L (ref 97–108)
GFR calc Af Amer: 96 mL/min/{1.73_m2} (ref >59)
GFR, EST NON AFRICAN AMERICAN: 84 mL/min/{1.73_m2} (ref >59)
Glucose: 91 mg/dL (ref 65–99)
POTASSIUM: 3.6 mmol/L (ref 3.5–5.2)
Sodium: 138 mmol/L (ref 134–144)

## 2015-09-22 LAB — LIPID PANEL
Chol/HDL Ratio: 6.6 ratio units — ABNORMAL HIGH (ref 0.0–4.4)
Cholesterol, Total: 243 mg/dL — ABNORMAL HIGH (ref 100–199)
HDL: 37 mg/dL — AB (ref >39)
LDL Calculated: 155 mg/dL — ABNORMAL HIGH (ref 0–99)
TRIGLYCERIDES: 257 mg/dL — AB (ref 0–149)
VLDL Cholesterol Cal: 51 mg/dL — ABNORMAL HIGH (ref 5–40)

## 2015-09-22 LAB — HIV ANTIBODY (ROUTINE TESTING W REFLEX): HIV SCREEN 4TH GENERATION: NONREACTIVE

## 2016-02-29 ENCOUNTER — Emergency Department (HOSPITAL_COMMUNITY)
Admission: EM | Admit: 2016-02-29 | Discharge: 2016-02-29 | Disposition: A | Discharge status: Home / Self Care | Payer: Self-pay | Attending: Emergency Medicine | Admitting: Emergency Medicine

## 2016-02-29 ENCOUNTER — Encounter (HOSPITAL_COMMUNITY): Payer: Self-pay

## 2016-02-29 ENCOUNTER — Emergency Department (HOSPITAL_COMMUNITY): Payer: Self-pay

## 2016-02-29 DIAGNOSIS — R202 Paresthesia of skin: Secondary | ICD-10-CM | POA: Insufficient documentation

## 2016-02-29 DIAGNOSIS — K219 Gastro-esophageal reflux disease without esophagitis: Secondary | ICD-10-CM | POA: Insufficient documentation

## 2016-02-29 DIAGNOSIS — R200 Anesthesia of skin: Secondary | ICD-10-CM | POA: Insufficient documentation

## 2016-02-29 DIAGNOSIS — Z87891 Personal history of nicotine dependence: Secondary | ICD-10-CM | POA: Insufficient documentation

## 2016-02-29 DIAGNOSIS — J449 Chronic obstructive pulmonary disease, unspecified: Secondary | ICD-10-CM | POA: Insufficient documentation

## 2016-02-29 DIAGNOSIS — M25512 Pain in left shoulder: Secondary | ICD-10-CM | POA: Insufficient documentation

## 2016-02-29 DIAGNOSIS — I10 Essential (primary) hypertension: Secondary | ICD-10-CM | POA: Insufficient documentation

## 2016-02-29 DIAGNOSIS — Z79899 Other long term (current) drug therapy: Secondary | ICD-10-CM | POA: Insufficient documentation

## 2016-02-29 LAB — DIFFERENTIAL
BASOS PCT: 1 %
Basophils Absolute: 0.1 10*3/uL (ref 0.0–0.1)
EOS PCT: 3 %
Eosinophils Absolute: 0.3 10*3/uL (ref 0.0–0.7)
LYMPHS PCT: 32 %
Lymphs Abs: 3 10*3/uL (ref 0.7–4.0)
MONO ABS: 0.6 10*3/uL (ref 0.1–1.0)
MONOS PCT: 6 %
NEUTROS ABS: 5.4 10*3/uL (ref 1.7–7.7)
Neutrophils Relative %: 58 %

## 2016-02-29 LAB — I-STAT CHEM 8, ED
BUN: 10 mg/dL (ref 6–20)
CALCIUM ION: 1.13 mmol/L (ref 1.12–1.23)
Chloride: 99 mmol/L — ABNORMAL LOW (ref 101–111)
Creatinine, Ser: 0.9 mg/dL (ref 0.44–1.00)
GLUCOSE: 139 mg/dL — AB (ref 65–99)
HCT: 44 % (ref 36.0–46.0)
HEMOGLOBIN: 15 g/dL (ref 12.0–15.0)
Potassium: 3.2 mmol/L — ABNORMAL LOW (ref 3.5–5.1)
Sodium: 139 mmol/L (ref 135–145)
TCO2: 26 mmol/L (ref 0–100)

## 2016-02-29 LAB — PROTIME-INR
INR: 0.95 (ref 0.00–1.49)
PROTHROMBIN TIME: 12.9 s (ref 11.6–15.2)

## 2016-02-29 LAB — CBC
HEMATOCRIT: 40.3 % (ref 36.0–46.0)
Hemoglobin: 13.3 g/dL (ref 12.0–15.0)
MCH: 29.7 pg (ref 26.0–34.0)
MCHC: 33 g/dL (ref 30.0–36.0)
MCV: 90 fL (ref 78.0–100.0)
Platelets: 311 10*3/uL (ref 150–400)
RBC: 4.48 MIL/uL (ref 3.87–5.11)
RDW: 13 % (ref 11.5–15.5)
WBC: 9.4 10*3/uL (ref 4.0–10.5)

## 2016-02-29 LAB — COMPREHENSIVE METABOLIC PANEL
ALK PHOS: 61 U/L (ref 38–126)
ALT: 20 U/L (ref 14–54)
AST: 21 U/L (ref 15–41)
Albumin: 3.7 g/dL (ref 3.5–5.0)
Anion gap: 12 (ref 5–15)
BUN: 9 mg/dL (ref 6–20)
CALCIUM: 9.2 mg/dL (ref 8.9–10.3)
CO2: 25 mmol/L (ref 22–32)
CREATININE: 0.87 mg/dL (ref 0.44–1.00)
Chloride: 101 mmol/L (ref 101–111)
Glucose, Bld: 145 mg/dL — ABNORMAL HIGH (ref 65–99)
Potassium: 3.4 mmol/L — ABNORMAL LOW (ref 3.5–5.1)
Sodium: 138 mmol/L (ref 135–145)
Total Bilirubin: 0.7 mg/dL (ref 0.3–1.2)
Total Protein: 6.4 g/dL — ABNORMAL LOW (ref 6.5–8.1)

## 2016-02-29 LAB — I-STAT TROPONIN, ED: TROPONIN I, POC: 0 ng/mL (ref 0.00–0.08)

## 2016-02-29 LAB — APTT: APTT: 28 s (ref 24–37)

## 2016-02-29 MED ORDER — IBUPROFEN 800 MG PO TABS: 800.0000 mg | ORAL_TABLET | Freq: Three times a day (TID) | ORAL | Status: DC | PRN

## 2016-02-29 MED ORDER — IBUPROFEN 800 MG PO TABS
ORAL_TABLET | ORAL | Status: AC
  Filled 2016-02-29: qty 1

## 2016-02-29 MED ORDER — IBUPROFEN 800 MG PO TABS
800.0000 mg | ORAL_TABLET | Freq: Once | ORAL | Status: AC
  Administered 2016-02-29: 800 mg via ORAL

## 2016-02-29 MED ORDER — HYDROCODONE-ACETAMINOPHEN 5-325 MG PO TABS: 1.0000 | ORAL_TABLET | Freq: Four times a day (QID) | ORAL | Status: DC | PRN

## 2016-02-29 NOTE — ED Provider Notes (Signed)
TIME SEEN: 5:30 AM  CHIEF COMPLAINT: Left shoulder pain and intermittent tingling in the left arm  HPI: Pt is a 50 y.o. female with history of COPD, hypertension and obesity who presents to the emergency department with complaints of constant dull, achy left shoulder pain that started 3-4 days ago. No injury to the shoulder. He reports that it is worse when she is sleeping at night. She has had intermittent tingling in the left forearm and hand. No numbness. Feels that she does have some decreased grip strength in the left hand. Denies any chest pain or chest discomfort, shortness of breath, diaphoresis, nausea or vomiting. No history of coronary artery disease. Denies any neck pain. No bowel or bladder incontinence. No difficulty walking. Patient does have some increased pain when she moves her left shoulder but is mostly bothering her at night. She is right-hand dominant.  ROS: See HPI Constitutional: no fever  Eyes: no drainage  ENT: no runny nose   Cardiovascular:  no chest pain  Resp: no SOB  GI: no vomiting GU: no dysuria Integumentary: no rash  Allergy: no hives  Musculoskeletal: no leg swelling  Neurological: no slurred speech ROS otherwise negative  PAST MEDICAL HISTORY/PAST SURGICAL HISTORY:  Past Medical History  Diagnosis Date  . COPD (chronic obstructive pulmonary disease) (HCC)     non-oxygen dependent  . Hypertension   . GERD (gastroesophageal reflux disease)     MEDICATIONS:  Prior to Admission medications   Medication Sig Start Date End Date Taking? Authorizing Provider  esomeprazole (NEXIUM) 20 MG capsule Take 20 mg by mouth daily at 12 noon.    Historical Provider, MD  lisinopril-hydrochlorothiazide (PRINZIDE,ZESTORETIC) 20-12.5 MG tablet Take 2 tablets by mouth daily. 09/21/15   Ruben Im, MD  Multiple Vitamin (MULTIVITAMIN) tablet Take 1 tablet by mouth daily.    Historical Provider, MD    ALLERGIES:  Allergies  Allergen Reactions  . Sulfa Drugs Cross  Reactors Nausea And Vomiting    "made me sick- I had to have IVs"    SOCIAL HISTORY:  Social History  Substance Use Topics  . Smoking status: Former Smoker -- 1.00 packs/day for 25 years    Types: Cigarettes    Quit date: 01/05/2008  . Smokeless tobacco: Never Used  . Alcohol Use: No    FAMILY HISTORY: Family History  Problem Relation Age of Onset  . Hypertension Mother   . Stroke Mother   . Hypertension Father   . Heart disease Father   . COPD Sister   . Heart disease Brother   . Hypertension Brother   . Cancer Maternal Aunt   . Cancer Paternal Aunt   . Cancer Maternal Grandmother   . Heart disease Maternal Grandfather   . Hypertension Maternal Grandfather   . Heart disease Paternal Grandmother   . Diabetes Paternal Grandmother   . Heart disease Paternal Grandfather   . Hypertension Paternal Grandfather     EXAM: BP 108/69 mmHg  Pulse 87  Temp(Src) 97.9 F (36.6 C) (Oral)  Resp 18  SpO2 97%  LMP 02/15/2016 CONSTITUTIONAL: Alert and oriented and responds appropriately to questions. Well-appearing; well-nourished, obese HEAD: Normocephalic EYES: Conjunctivae clear, PERRL ENT: normal nose; no rhinorrhea; moist mucous membranes NECK: Supple, no meningismus, no LAD; no midline spinal tenderness or step-off or deformity CARD: RRR; S1 and S2 appreciated; no murmurs, no clicks, no rubs, no gallops RESP: Normal chest excursion without splinting or tachypnea; breath sounds clear and equal bilaterally; no wheezes, no rhonchi, no  rales, no hypoxia or respiratory distress, speaking full sentences ABD/GI: Normal bowel sounds; non-distended; soft, non-tender, no rebound, no guarding, no peritoneal signs BACK:  The back appears normal and is non-tender to palpation, there is no CVA tenderness EXT: Full range of motion in the left shoulder with no bony deformity or joint effusion. No erythema or warmth. Some tenderness to palpation over the left deltoid area. 2+ radial pulses  bilaterally. Normal strength in all 4 extremities. Reports normal sensation in the right arm has some decreased sensation in the posterior left forearm and fifth finger that is intermittent on exam. Normal capillary refill. No joint effusion. No erythema or warmth. She has normal strength in all muscle groups of the left shoulder, no pain with testing of the supra/infraspinatus, subscapularis or teres minor. Normal ROM in all joints; non-tender to palpation; no edema; no cyanosis, no calf tenderness or swelling    SKIN: Normal color for age and race; warm; no rash NEURO: Moves all extremities equally, sensation to light touch intact diffusely other than some intermittent tingling that she notes in the left ulnar nerve distribution that is intermittent, cranial nerves II through XII intact; strength 5/5 in all 4 extremities, 2+ deep tendon reflexes in bilateral upper and lower extremity is, normal gait PSYCH: The patient's mood and manner are appropriate. Grooming and personal hygiene are appropriate.  MEDICAL DECISION MAKING: Patient here with complaints of left shoulder pain and intermittent tingling in the arm. She does have some pain with movement of the shoulder and palpation of the deltoid area. Nothing to suggest a septic arthritis, gout. No history of injury to the shoulder to suggest fracture and nothing that she is any sign of dislocation. I do not feel x-ray at this time would be helpful.   Discussed with patient that I have low suspicion that this is a stroke. Also low suspicion that this is radiculopathy. Her symptoms of tingling seemed to be related to peripheral neuropathy.  Symptoms do not involve numbness in the entire arm or weakness in the entire arm. She has normal strength currently and only has intermittent tingling following an ulnar nerve distribution. No neck pain on exam. Normal reflexes. No bowel or bladder incontinence. Normal gait.  Symptoms do not follow any distribution  consistent with a stroke or dermatome. She has had a negative head CT and I do not feel she needs an MRI at this time.  She has good reflexes.  Also very low suspicion that this is cardiac in nature. Her EKG shows no ischemic changes and she has a negative troponin despite having constant symptoms of left shoulder pain for the past 4 days. I do not feel she needs serial enzymes or admission.   I recommended using ibuprofen as needed for pain, rest, elevation and alternating heat and ice. I feel that this is likely secondary to rotator cuff tendinitis. No sign of tear on exam and she has good strength. She does have a PCP for follow-up, [facility]. Have recommended if medical management is not improving her symptoms that she follow-up as an outpatient. I have discussed with her and her husband return precautions. They verbalize understanding and are comfortable with this plan.       EKG Interpretation  Date/Time:  Wednesday February 29 2016 01:29:56 EDT Ventricular Rate:  91 PR Interval:  142 QRS Duration: 76 QT Interval:  354 QTC Calculation: 435 R Axis:   66 Text Interpretation:  Normal sinus rhythm Normal ECG No  significant change since last tracing Confirmed by Larina Lieurance,  DO, Ndeye Tenorio [phone]) on 02/29/2016 5:31:00 AM          Layla Maw Chaske Paskett, DO 02/29/16 [id number]

## 2016-02-29 NOTE — Discharge Instructions (Signed)
Shoulder Pain The shoulder is the joint that connects your arms to your body. The bones that form the shoulder joint include the upper arm bone (humerus), the shoulder blade (scapula), and the collarbone (clavicle). The top of the humerus is shaped like a ball and fits into a rather flat socket on the scapula (glenoid cavity). A combination of muscles and strong, fibrous tissues that connect muscles to bones (tendons) support your shoulder joint and hold the ball in the socket. Small, fluid-filled sacs (bursae) are located in different areas of the joint. They act as cushions between the bones and the overlying soft tissues and help reduce friction between the gliding tendons and the bone as you move your arm. Your shoulder joint allows a wide range of motion in your arm. This range of motion allows you to do things like scratch your back or throw a ball. However, this range of motion also makes your shoulder more prone to pain from overuse and injury. Causes of shoulder pain can originate from both injury and overuse and usually can be grouped in the following four categories:  Redness, swelling, and pain (inflammation) of the tendon (tendinitis) or the bursae (bursitis).  Instability, such as a dislocation of the joint.  Inflammation of the joint (arthritis).  Broken bone (fracture). HOME CARE INSTRUCTIONS   Apply ice to the sore area.  Put ice in a plastic bag.  Place a towel between your skin and the bag.  Leave the ice on for 15-20 minutes, 3-4 times per day for the first 2 days, or as directed by your health care provider.  Stop using cold packs if they do not help with the pain.  If you have a shoulder sling or immobilizer, wear it as long as your caregiver instructs. Only remove it to shower or bathe. Move your arm as little as possible, but keep your hand moving to prevent swelling.  Squeeze a soft ball or foam pad as much as possible to help prevent swelling.  Only take  over-the-counter or prescription medicines for pain, discomfort, or fever as directed by your caregiver. SEEK MEDICAL CARE IF:   Your shoulder pain increases, or new pain develops in your arm, hand, or fingers.  Your hand or fingers become cold and numb.  Your pain is not relieved with medicines. SEEK IMMEDIATE MEDICAL CARE IF:   Your arm, hand, or fingers are numb or tingling.  Your arm, hand, or fingers are significantly swollen or turn white or blue. MAKE SURE YOU:   Understand these instructions.  Will watch your condition.  Will get help right away if you are not doing well or get worse.   This information is not intended to replace advice given to you by your health care provider. Make sure you discuss any questions you have with your health care provider.   Document Released: 09/12/2005 Document Revised: 12/24/2014 Document Reviewed: 03/28/2015 Elsevier Interactive Patient Education 2016 [employer]  Paresthesia Paresthesia is an abnormal burning or prickling sensation. This sensation is generally felt in the hands, arms, legs, or feet. However, it may occur in any part of the body. Usually, it is not painful. The feeling may be described as:  Tingling or numbness.  Pins and needles.  Skin crawling.  Buzzing.  Limbs falling asleep.  Itching. Most people experience temporary (transient) paresthesia at some time in their lives. Paresthesia may occur when you breathe too quickly (hyperventilation). It can also occur without any apparent cause. Commonly, paresthesia occurs when  pressure is placed on a nerve. The sensation quickly goes away after the pressure is removed. For some people, however, paresthesia is a long-lasting (chronic) condition that is caused by an underlying disorder. If you continue to have paresthesia, you may need further medical evaluation. HOME CARE INSTRUCTIONS Watch your condition for any changes. Taking the following actions may help to lessen  any discomfort that you are feeling:  Avoid drinking alcohol.  Try acupuncture or massage to help relieve your symptoms.  Keep all follow-up visits as directed by your health care provider. This is important. SEEK MEDICAL CARE IF:  You continue to have episodes of paresthesia.  Your burning or prickling feeling gets worse when you walk.  You have pain, cramps, or dizziness.  You develop a rash. SEEK IMMEDIATE MEDICAL CARE IF:  You feel weak.  You have trouble walking or moving.  You have problems with speech, understanding, or vision.  You feel confused.  You cannot control your bladder or bowel movements.  You have numbness after an injury.  You faint.   This information is not intended to replace advice given to you by your health care provider. Make sure you discuss any questions you have with your health care provider.   Document Released: 11/23/2002 Document Revised: 04/19/2015 Document Reviewed: 11/29/2014 Elsevier Interactive Patient Education 2016 [employer] Inc.   Likely Rotator Cuff Tendinitis Rotator cuff tendinitis is inflammation of the tough, cord-like bands that connect muscle to bone (tendons) in your rotator cuff. Your rotator cuff is the collection of all the muscles and tendons that connect your arm to your shoulder. Your rotator cuff holds the head of your upper arm bone (humerus) in the cup (fossa) of your shoulder blade (scapula). CAUSES Rotator cuff tendinitis is usually caused by overusing the joint involved.  SIGNS AND SYMPTOMS  Deep ache in the shoulder also felt on the outside upper arm over the shoulder muscle.  Point tenderness over the area that is injured.  Pain comes on gradually and becomes worse with lifting the arm to the side (abduction) or turning it inward (internal rotation).  May lead to a chronic tear: When a rotator cuff tendon becomes inflamed, it runs the risk of losing its blood supply, causing some tendon fibers to die.  This increases the risk that the tendon can fray and partially or completely tear. DIAGNOSIS Rotator cuff tendinitis is diagnosed by taking a medical history, performing a physical exam, and reviewing results of imaging exams. The medical history is useful to help determine the type of rotator cuff injury. The physical exam will include looking at the injured shoulder, feeling the injured area, and watching you do range-of-motion exercises. X-ray exams are typically done to rule out other causes of shoulder pain, such as fractures. MRI is the imaging exam usually used for significant shoulder injuries. Sometimes a dye study called CT arthrogram is done, but it is not as widely used as MRI. In some institutions, special ultrasound tests may also be used to aid in the diagnosis. TREATMENT  Less Severe Cases  Use of a sling to rest the shoulder for a short period of time. Prolonged use of the sling can cause stiffness, weakness, and loss of motion of the shoulder joint.  Anti-inflammatory medicines, such as ibuprofen or naproxen sodium, may be prescribed. More Severe Cases  Physical therapy.  Use of steroid injections into the shoulder joint.  Surgery. HOME CARE INSTRUCTIONS   Use a sling or splint until the pain decreases. Prolonged  use of the sling can cause stiffness, weakness, and loss of motion of the shoulder joint.  Apply ice to the injured area:  Put ice in a plastic bag.  Place a towel between your skin and the bag.  Leave the ice on for 20 minutes, 2-3 times a day.  Try to avoid use other than gentle range of motion while your shoulder is painful. Use the shoulder and exercise only as directed by your health care provider. Stop exercises or range of motion if pain or discomfort increases, unless directed otherwise by your health care provider.  Only take over-the-counter or prescription medicines for pain, discomfort, or fever as directed by your health care provider.  If you  were given a shoulder sling and straps (immobilizer), do not remove it except as directed, or until you see a health care provider for a follow-up exam. If you need to remove it, move your arm as little as possible or as directed.  You may want to sleep on several pillows at night to lessen swelling and pain. SEEK IMMEDIATE MEDICAL CARE IF:   Your shoulder pain increases or new pain develops in your arm, hand, or fingers and is not relieved with medicines.  You have new, unexplained symptoms, especially increased numbness in the hands or loss of strength.  You develop any worsening of the problems that brought you in for care.  Your arm, hand, or fingers are numb or tingling.  Your arm, hand, or fingers are swollen, painful, or turn white or blue. MAKE SURE YOU:  Understand these instructions.  Will watch your condition.  Will get help right away if you are not doing well or get worse.   This information is not intended to replace advice given to you by your health care provider. Make sure you discuss any questions you have with your health care provider.   Document Released: 02/23/2004 Document Revised: 12/24/2014 Document Reviewed: 07/15/2013 Elsevier Interactive Patient Education [employer]

## 2016-02-29 NOTE — ED Notes (Signed)
Pt here with c/o left shoulder and arm pain/aches with numbness to fingertips onset Saturday. Pt ambulatory to triage, speech clear, no drift to arms or legs with strong hand grasps bilaterally. Denies injury to left shoulder.

## 2016-02-29 NOTE — ED Notes (Signed)
Pt states that since Saturday she has been experiencing left shoulder pain and left hand numbness, worse when the pt lays down especially when laying on the right side.

## 2016-02-29 NOTE — ED Notes (Signed)
Pt verbalized understanding of d/c instructions and has no further questions. Pt stable and NAD. D/c home with family driving.  

## 2016-06-12 ENCOUNTER — Encounter: Payer: Self-pay | Admitting: *Deleted

## 2016-10-06 ENCOUNTER — Other Ambulatory Visit: Payer: Self-pay | Admitting: Internal Medicine

## 2016-10-06 DIAGNOSIS — I10 Essential (primary) hypertension: Secondary | ICD-10-CM

## 2016-10-08 NOTE — Telephone Encounter (Addendum)
Call pt - states she will be out of med after Thursday. [facility]

## 2016-10-12 ENCOUNTER — Telehealth: Payer: Self-pay | Admitting: Internal Medicine

## 2016-10-12 NOTE — Telephone Encounter (Signed)
APT. REMINDER CALL, LMTCB °

## 2016-10-15 ENCOUNTER — Encounter: Payer: Self-pay | Admitting: Internal Medicine

## 2016-10-15 ENCOUNTER — Ambulatory Visit (INDEPENDENT_AMBULATORY_CARE_PROVIDER_SITE_OTHER): Payer: Self-pay | Admitting: Internal Medicine

## 2016-10-15 VITALS — BP 136/75 | HR 80 | Temp 97.7°F | Ht 63.0 in | Wt 238.3 lb

## 2016-10-15 DIAGNOSIS — Z79899 Other long term (current) drug therapy: Secondary | ICD-10-CM

## 2016-10-15 DIAGNOSIS — Z0000 Encounter for general adult medical examination without abnormal findings: Secondary | ICD-10-CM

## 2016-10-15 DIAGNOSIS — Z87891 Personal history of nicotine dependence: Secondary | ICD-10-CM

## 2016-10-15 DIAGNOSIS — I10 Essential (primary) hypertension: Secondary | ICD-10-CM

## 2016-10-15 DIAGNOSIS — Z23 Encounter for immunization: Secondary | ICD-10-CM

## 2016-10-15 MED ORDER — LISINOPRIL-HYDROCHLOROTHIAZIDE 20-12.5 MG PO TABS: 2.0000 | ORAL_TABLET | Freq: Every day | ORAL | 11 refills | Status: DC

## 2016-10-15 NOTE — Assessment & Plan Note (Signed)
Patient takes Lisinopril-HCTZ 40-25 mg daily. BP today is 136/75. She does check her BP at home and sees similar readings. She is requesting a refill on her BP medication and otherwise has no complaints. She denies any chest pain, SOB, headache, or swelling. She walks on the treadmill about 30 minutes at a time 3x a week. She does admit that she drinks too much soda and is trying to cut back.  BP is well controlled. Will continue current medication and refill today. BMET in March 2017 showed good creatinine. -Refill Lisinopril-HCTZ 40-25 mg daily -Recheck BMET next year -Continue diet/exercise; cut back on sodas -DASH diet info provided

## 2016-10-15 NOTE — Assessment & Plan Note (Signed)
Patient given flu shot today.

## 2016-10-15 NOTE — Progress Notes (Signed)
Case discussed with Dr. Patel at the time of the visit.  We reviewed the resident's history and exam and pertinent patient test results.  I agree with the assessment, diagnosis, and plan of care documented in the resident's note. 

## 2016-10-15 NOTE — Progress Notes (Signed)
   CC: HTN  HPI:  [name] is a 50 y.o. female with HTN who presents for follow management of her blood pressure and medication refill.  HTN: Patient takes Lisinopril-HCTZ 40-25 mg daily. BP today is 136/75. She does check her BP at home and sees similar readings. She is requesting a refill on her BP medication and otherwise has no complaints. She denies any chest pain, SOB, headache, or swelling. She walks on the treadmill about 30 minutes at a time 3x a week. She does admit that she drinks too much soda and is trying to cut back.  Health Maintenance: Patient given flu shot today.  Past Medical History:  Diagnosis Date  . COPD (chronic obstructive pulmonary disease) (HCC)    non-oxygen dependent  . GERD (gastroesophageal reflux disease)   . Hypertension     Review of Systems:   Review of Systems  Respiratory: Negative for shortness of breath.   Cardiovascular: Negative for chest pain and leg swelling.  Neurological: Negative for headaches.     Physical Exam:  Vitals:   10/15/16 0832  BP: 136/75  Pulse: 80  Temp: 97.7 F (36.5 C)  TempSrc: Oral  SpO2: 100%  Weight: 238 lb 4.8 oz (108.1 kg)  Height: 5\' 3"  (1.6 m)   Physical Exam  Constitutional: She is oriented to person, place, and time. She appears well-developed and well-nourished. No distress.  Cardiovascular: Normal rate and regular rhythm.   No murmur heard. Pulmonary/Chest: Effort normal. No respiratory distress. She has no wheezes. She has no rales.  Musculoskeletal: She exhibits no edema.  Neurological: She is alert and oriented to person, place, and time.    Assessment & Plan:   See Encounters Tab for problem based charting.  Patient discussed with Dr. Josem KaufmannKlima

## 2016-10-15 NOTE — Patient Instructions (Signed)
It was a pleasure to meet you [name].  Please continue your medications as prescribed. Your blood pressure is looking good.  I have refilled your Prinzide and we are giving you the flu shot today.  Please continue to work on the diet and exercise, you are doing a good job so far!  Please follow up with us in 1 year or sooner if needed.   DASH Eating Plan DASH stands for "Dietary Approaches to Stop Hypertension." The DASH eating plan is a healthy eating plan that has been shown to reduce high blood pressure (hypertension). Additional health benefits may include reducing the risk of type 2 diabetes mellitus, heart disease, and stroke. The DASH eating plan may also help with weight loss. WHAT DO I NEED TO KNOW ABOUT THE DASH EATING PLAN? For the DASH eating plan, you will follow these general guidelines:  Choose foods with a percent daily value for sodium of less than 5% (as listed on the food label).  Use salt-free seasonings or herbs instead of table salt or sea salt.  Check with your health care provider or pharmacist before using salt substitutes.  Eat lower-sodium products, often labeled as "lower sodium" or "no salt added."  Eat fresh foods.  Eat more vegetables, fruits, and low-fat dairy products.  Choose whole grains. Look for the word "whole" as the first word in the ingredient list.  Choose fish and skinless chicken or [place] more often than red meat. Limit fish, poultry, and meat to 6 oz (170 g) each day.  Limit sweets, desserts, sugars, and sugary drinks.  Choose heart-healthy fats.  Limit cheese to 1 oz (28 g) per day.  Eat more home-cooked food and less restaurant, buffet, and fast food.  Limit fried foods.  Cook foods using methods other than frying.  Limit canned vegetables. If you do use them, rinse them well to decrease the sodium.  When eating at a restaurant, ask that your food be prepared with less salt, or no salt if possible. WHAT FOODS CAN I  EAT? Seek help from a dietitian for individual calorie needs. Grains Whole grain or whole wheat bread. Brown rice. Whole grain or whole wheat pasta. Quinoa, bulgur, and whole grain cereals. Low-sodium cereals. Corn or whole wheat flour tortillas. Whole grain cornbread. Whole grain crackers. Low-sodium crackers. Vegetables Fresh or frozen vegetables (raw, steamed, roasted, or grilled). Low-sodium or reduced-sodium tomato and vegetable juices. Low-sodium or reduced-sodium tomato sauce and paste. Low-sodium or reduced-sodium canned vegetables.  Fruits All fresh, canned (in natural juice), or frozen fruits. Meat and Other Protein Products Ground beef (85% or leaner), grass-fed beef, or beef trimmed of fat. Skinless chicken or [place]. Ground chicken or [place]. Pork trimmed of fat. All fish and seafood. Eggs. Dried beans, peas, or lentils. Unsalted nuts and seeds. Unsalted canned beans. Dairy Low-fat dairy products, such as skim or 1% milk, 2% or reduced-fat cheeses, low-fat ricotta or cottage cheese, or plain low-fat yogurt. Low-sodium or reduced-sodium cheeses. Fats and Oils Tub margarines without trans fats. Light or reduced-fat mayonnaise and salad dressings (reduced sodium). Avocado. Safflower, olive, or canola oils. Natural peanut or almond butter. Other Unsalted popcorn and pretzels. The items listed above may not be a complete list of recommended foods or beverages. Contact your dietitian for more options. WHAT FOODS ARE NOT RECOMMENDED? Grains White bread. White pasta. White rice. Refined cornbread. Bagels and croissants. Crackers that contain trans fat. Vegetables Creamed or fried vegetables. Vegetables in a cheese sauce. Regular canned vegetables. Regular  canned tomato sauce and paste. Regular tomato and vegetable juices. Fruits Dried fruits. Canned fruit in light or heavy syrup. Fruit juice. Meat and Other Protein Products Fatty cuts of meat. Ribs, chicken wings, bacon, sausage,  bologna, salami, chitterlings, fatback, hot dogs, bratwurst, and packaged luncheon meats. Salted nuts and seeds. Canned beans with salt. Dairy Whole or 2% milk, cream, half-and-half, and cream cheese. Whole-fat or sweetened yogurt. Full-fat cheeses or blue cheese. Nondairy creamers and whipped toppings. Processed cheese, cheese spreads, or cheese curds. Condiments Onion and garlic salt, seasoned salt, table salt, and sea salt. Canned and packaged gravies. Worcestershire sauce. Tartar sauce. Barbecue sauce. Teriyaki sauce. Soy sauce, including reduced sodium. Steak sauce. Fish sauce. Oyster sauce. Cocktail sauce. Horseradish. Ketchup and mustard. Meat flavorings and tenderizers. Bouillon cubes. Hot sauce. Tabasco sauce. Marinades. Taco seasonings. Relishes. Fats and Oils Butter, stick margarine, lard, shortening, ghee, and bacon fat. Coconut, palm kernel, or palm oils. Regular salad dressings. Other Pickles and olives. Salted popcorn and pretzels. The items listed above may not be a complete list of foods and beverages to avoid. Contact your dietitian for more information. WHERE CAN I FIND MORE INFORMATION? National Heart, Lung, and Blood Institute: travelstabloid.com   This information is not intended to replace advice given to you by your health care provider. Make sure you discuss any questions you have with your health care provider.   Document Released: 11/22/2011 Document Revised: 12/24/2014 Document Reviewed: 10/07/2013 Elsevier Interactive Patient Education [employer].

## 2017-03-04 ENCOUNTER — Telehealth: Payer: Self-pay | Admitting: Internal Medicine

## 2017-03-04 NOTE — Telephone Encounter (Signed)
APT. REMINDER CALL, LMTCB °

## 2017-03-05 ENCOUNTER — Encounter: Payer: Self-pay | Admitting: Internal Medicine

## 2017-03-05 ENCOUNTER — Ambulatory Visit (INDEPENDENT_AMBULATORY_CARE_PROVIDER_SITE_OTHER): Payer: Self-pay | Admitting: Internal Medicine

## 2017-03-05 DIAGNOSIS — N920 Excessive and frequent menstruation with regular cycle: Secondary | ICD-10-CM

## 2017-03-05 DIAGNOSIS — Z0000 Encounter for general adult medical examination without abnormal findings: Secondary | ICD-10-CM

## 2017-03-05 DIAGNOSIS — G8929 Other chronic pain: Secondary | ICD-10-CM

## 2017-03-05 DIAGNOSIS — J449 Chronic obstructive pulmonary disease, unspecified: Secondary | ICD-10-CM

## 2017-03-05 DIAGNOSIS — Z87891 Personal history of nicotine dependence: Secondary | ICD-10-CM

## 2017-03-05 DIAGNOSIS — J410 Simple chronic bronchitis: Secondary | ICD-10-CM

## 2017-03-05 DIAGNOSIS — M25552 Pain in left hip: Secondary | ICD-10-CM

## 2017-03-05 MED ORDER — NORGESTIMATE-ETH ESTRADIOL 0.25-35 MG-MCG PO TABS: 1.0000 | ORAL_TABLET | Freq: Every day | ORAL | 11 refills | Status: DC

## 2017-03-05 MED ORDER — IBUPROFEN 800 MG PO TABS: 800.0000 mg | ORAL_TABLET | Freq: Three times a day (TID) | ORAL | 0 refills | Status: DC | PRN

## 2017-03-05 NOTE — Progress Notes (Signed)
   CC: chronic left hip pain   HPI: [name] is a 51 y.o. with past medical history HTN, HLD, COPD, menorrhagia, and chronic left hip pain who presents to clinic for follow up of Hypertension   Hip pain  Chronic left lateral hip pain which has been ongoing for years and is worse with walking. She has found relief with aleve and taking warm baths but is tired of taking aleve all day. She does not report sensory changes or incontinence. She is curious about having a hip injection but worried about what this will cost.   Menorrhagia  Has been on OCPs in the past for heavy periods, she chose to discontinue these when her symptoms improved. Her heavy periods have returned they last 7-10 days and she uses about 10 pads per day. She denies bleeding between periods, vaginal pain or discharge.   COPD  She quit smoking 18 years ago reports DOE has improved since cessation. She uses an albuterol nebulizer less than one time per month.   Please see problem list for status of the pt's chronic medical problems.  Past Medical History:  Diagnosis Date  . COPD (chronic obstructive pulmonary disease) (HCC)    non-oxygen dependent  . GERD (gastroesophageal reflux disease)   . Hypertension     Review of Systems:  Please see each problem below for a pertinent review of systems.  Physical Exam:  Vitals:   03/05/17 1546  BP: 128/68  Pulse: 75  Temp: 98.3 F (36.8 C)  TempSrc: Oral  SpO2: 99%  Weight: 233 lb 11.2 oz (106 kg)  Height: 5\' 3"  (1.6 m)   Physical Exam  Constitutional: She is oriented to person, place, and time. She appears well-developed and well-nourished. No distress.  HENT:  Head: Normocephalic and atraumatic.  Eyes: Conjunctivae are normal. No scleral icterus.  Neck: Normal range of motion.  Cardiovascular: Normal rate and regular rhythm.   No murmur heard. Pulmonary/Chest: Effort normal. No respiratory distress. She has no wheezes. She has no rales.  Abdominal: Soft.    Musculoskeletal:  Lateral left hip tenderness over the greater trochanter.  No groin pain with hip flexion, no sciatic pain with straight leg raise.  Standing on only the left leg reproduces the pain, she does not have a trendelenburg gait   Neurological: She is alert and oriented to person, place, and time.  Skin: Skin is warm and dry. She is not diaphoretic.  Psychiatric: She has a normal mood and affect. Her behavior is normal.     Assessment & Plan:   See Encounters Tab for problem based charting.   Hip pain  Ongoing lateral left hip pain which has been ongoing for years. 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 She is interested in but concerned about the cost of hip injection and ask me to find out the cost. I have reached out to our billing coordinator.  -RTC in 1 month  - continue conservative management   Menorrhagia  History is consistent with menorrhagia.  - prescribed sprintec -will RTC in one month for pap smear.   COPD  Stable at this time. No coughing or wheezing on exam.  - continue albuterol nebs PRN   Health Maintenance  Will schedule an appointment for pap smear next month.   Patient discussed with Dr. Oswaldo DoneVincent

## 2017-03-05 NOTE — Patient Instructions (Addendum)
[name],   It was a pleasure to meet you today  Keep up the great work with your weight loss!  For your heavy periods, I have prescribed your usual birth control pill   You are due for a pap smear, please schedule an appointment for that next month  I should have an idea of how much the hip injection would cost by the point of that appointment  Please schedule an appointment to see me in 6 months

## 2017-03-08 NOTE — Progress Notes (Signed)
[facility] Clinic Attending  I saw and evaluated the patient.  I personally confirmed the key portions of the history and exam documented by Dr. Obie DredgeBlum and I reviewed pertinent patient test results.  The assessment, diagnosis, and plan were formulated together and I agree with the documentation in the resident's note.  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 Negative trendelenburg gait and sign, so I doubt a tendon tear at this time. Likely would benefit from steroid injection into the trochanteric bursa for symptomatic relief if pain persists in the future.

## 2017-03-08 NOTE — Assessment & Plan Note (Signed)
Will schedule an appointment for pap smear next month.

## 2017-03-08 NOTE — Assessment & Plan Note (Signed)
History is consistent with menorrhagia.  - prescribed sprintec -will RTC in one month for pap smear.

## 2017-03-08 NOTE — Addendum Note (Signed)
Addended by: Erlinda HongVINCENT, Lizzy Hamre T on: 03/08/2017 12:42 PM   Modules accepted: Level of Service

## 2017-03-08 NOTE — Assessment & Plan Note (Signed)
Stable at this time. No coughing or wheezing on exam.  - continue albuterol nebs PRN

## 2017-03-08 NOTE — Assessment & Plan Note (Signed)
Ongoing lateral left hip pain which has been ongoing for years. 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 She is interested in but concerned about the cost of hip injection and ask me to find out the cost. I have reached out to our billing coordinator.  -RTC in 1 month  - continue conservative management

## 2017-04-05 ENCOUNTER — Ambulatory Visit: Payer: Self-pay

## 2017-04-18 ENCOUNTER — Telehealth: Payer: Self-pay | Admitting: Internal Medicine

## 2017-04-18 NOTE — Telephone Encounter (Signed)
APT. REMINDER CALL, LMTCB °

## 2017-04-19 ENCOUNTER — Ambulatory Visit: Payer: Self-pay

## 2017-07-22 ENCOUNTER — Ambulatory Visit (INDEPENDENT_AMBULATORY_CARE_PROVIDER_SITE_OTHER): Payer: Self-pay | Admitting: Internal Medicine

## 2017-07-22 VITALS — BP 150/75 | HR 82 | Temp 98.3°F | Ht 63.0 in | Wt 223.7 lb

## 2017-07-22 DIAGNOSIS — Z8719 Personal history of other diseases of the digestive system: Secondary | ICD-10-CM

## 2017-07-22 DIAGNOSIS — R1013 Epigastric pain: Secondary | ICD-10-CM | POA: Insufficient documentation

## 2017-07-22 MED ORDER — PANTOPRAZOLE SODIUM 40 MG PO TBEC
40.0000 mg | DELAYED_RELEASE_TABLET | Freq: Every day | ORAL | 0 refills | Status: DC
Start: 2017-07-22 — End: 2017-12-02

## 2017-07-22 NOTE — Progress Notes (Signed)
   CC: Patient is complaining of an upset stomach.  HPI:  [name] is a 51 y.o. female with a past medical history of conditions listed below presenting to the clinic complaining of an upset stomach. Please see problem based charting for the status of the patient's current and chronic medical conditions.   Past Medical History:  Diagnosis Date  . COPD (chronic obstructive pulmonary disease) (HCC)    non-oxygen dependent  . GERD (gastroesophageal reflux disease)   . Hypertension    Review of Systems: Pertinent positives mentioned in HPI. Remainder of all ROS negative.   Physical Exam:  Vitals:   07/22/17 0941  BP: (!) 150/75  Pulse: 82  Temp: 98.3 F (36.8 C)  TempSrc: Oral  SpO2: 99%  Weight: 223 lb 11.2 oz (101.5 kg)  Height: 5\' 3"  (1.6 m)   Physical Exam  Constitutional: She is oriented to person, place, and time. She appears well-developed and well-nourished. No distress.  HENT:  Head: Normocephalic and atraumatic.  Eyes: Right eye exhibits no discharge. Left eye exhibits no discharge.  Cardiovascular: Normal rate, regular rhythm and intact distal pulses.   Pulmonary/Chest: Effort normal and breath sounds normal. No respiratory distress. She has no wheezes. She has no rales.  Abdominal: Soft. Bowel sounds are normal. She exhibits no distension. There is tenderness. There is no rebound and no guarding.  Point tenderness a few centimeters lateral to the umbilicus on the left.  Musculoskeletal: She exhibits no edema.  Neurological: She is alert and oriented to person, place, and time.  Skin: Skin is warm and dry.  Psychiatric: She has a normal mood and affect.    Assessment & Plan:   See Encounters Tab for problem based charting.  Patient discussed with Dr. Criselda PeachesMullen

## 2017-07-22 NOTE — Patient Instructions (Signed)
Ms. Georgina PillionMassey it was nice meeting you today.  -Stop taking Nexium  -Start taking Protonix 40 mg once daily before breakfast  -Stop taking Aleve  -Return for a follow-up in one month

## 2017-07-22 NOTE — Assessment & Plan Note (Addendum)
History of present illness Patient reports having a prior history of ischemic colitis 4.5 years ago. She is now afraid she might be having it again as for the past 6-8 months she has been experiencing occasional nausea and abdominal pain a few centimeters lateral to the umbilicus on the left. States her symptoms have been getting progressively worse and now she experiences them every day. Symptoms are not related to eating and are worse when laying down. Also reports having early satiety. Reports having regular bowel movements. Denies having any vomiting, diarrhea, constipation, fevers, or chills. Denies having any melena, hematochezia, or hematemesis. States she has been taking Nexium daily for GERD for the past 5 years and believes her GERD symptoms are currently well controlled. Also reports taking 2 tablets of Aleve every day for the past 6 months. Reports having a history of tubal ligation in the past. Denies any family history of GI malignancy.  Assessment Patient's nausea and abdominal pain are likely related to dyspepsia. She does report taking chronic NSAIDs which could contribute to her dyspepsia. Colonoscopy in December 2013 showing ischemic colitis. She has lost approximately 10 pounds in the past 5 months, however, chronic ischemic colitis less likely as she does not report noticing blood in her stool. Her weight loss is likely related to her worsening dyspepsia. Gastroparesis less likely as patient does not have diabetes. Her abdominal pain is not likely secondary to adhesions from prior surgery as she does not have any symptoms of obstruction and is having regular bowel movements. Colon cancer remains on the differential as  patient has been experiencing weight loss, however, it is less likely as she does not report any changes in her bowel habits or blood in her stool.  Plan -Check CBC, CMP, and lipase -Stop Nexium -Start Protonix 40 mg once daily -Stop NSAIDs -Return to the clinic in 1  month  Addendum 07/23/2017: Normal hemoglobin (12.0). LFTs and Tbili normal. Lipase normal. Spoke to the patient over the phone. Advised her to take Protonix. Advised her to avoid taking NSAIDs and instead take Tylenol as needed for her hip pain.

## 2017-07-23 LAB — CBC WITH DIFFERENTIAL/PLATELET
BASOS ABS: 0 10*3/uL (ref 0.0–0.2)
BASOS: 0 %
EOS (ABSOLUTE): 0.1 10*3/uL (ref 0.0–0.4)
Eos: 1 %
Hematocrit: 36.5 % (ref 34.0–46.6)
Hemoglobin: 12 g/dL (ref 11.1–15.9)
IMMATURE GRANS (ABS): 0 10*3/uL (ref 0.0–0.1)
IMMATURE GRANULOCYTES: 0 %
LYMPHS: 26 %
Lymphocytes Absolute: 1.9 10*3/uL (ref 0.7–3.1)
MCH: 27.3 pg (ref 26.6–33.0)
MCHC: 32.9 g/dL (ref 31.5–35.7)
MCV: 83 fL (ref 79–97)
MONOCYTES: 6 %
Monocytes Absolute: 0.5 10*3/uL (ref 0.1–0.9)
NEUTROS PCT: 67 %
Neutrophils Absolute: 4.8 10*3/uL (ref 1.4–7.0)
PLATELETS: 340 10*3/uL (ref 150–379)
RBC: 4.39 x10E6/uL (ref 3.77–5.28)
RDW: 14.3 % (ref 12.3–15.4)
WBC: 7.2 10*3/uL (ref 3.4–10.8)

## 2017-07-23 LAB — CMP14 + ANION GAP
A/G RATIO: 1.6 (ref 1.2–2.2)
ALT: 8 IU/L (ref 0–32)
AST: 12 IU/L (ref 0–40)
Albumin: 3.9 g/dL (ref 3.5–5.5)
Alkaline Phosphatase: 53 IU/L (ref 39–117)
Anion Gap: 14 mmol/L (ref 10.0–18.0)
BUN/Creatinine Ratio: 10 (ref 9–23)
BUN: 8 mg/dL (ref 6–24)
Bilirubin Total: 0.4 mg/dL (ref 0.0–1.2)
CHLORIDE: 101 mmol/L (ref 96–106)
CO2: 22 mmol/L (ref 20–29)
Calcium: 9.1 mg/dL (ref 8.7–10.2)
Creatinine, Ser: 0.84 mg/dL (ref 0.57–1.00)
GFR calc Af Amer: 94 mL/min/{1.73_m2} (ref >59)
GFR, EST NON AFRICAN AMERICAN: 81 mL/min/{1.73_m2} (ref >59)
GLUCOSE: 123 mg/dL — AB (ref 65–99)
Globulin, Total: 2.5 g/dL (ref 1.5–4.5)
POTASSIUM: 3.5 mmol/L (ref 3.5–5.2)
Sodium: 137 mmol/L (ref 134–144)
TOTAL PROTEIN: 6.4 g/dL (ref 6.0–8.5)

## 2017-07-23 LAB — LIPASE: LIPASE: 24 U/L (ref 14–72)

## 2017-07-25 NOTE — Progress Notes (Signed)
Internal Medicine Clinic Attending  Case discussed with Dr. Rathoreat the time of the visit. We reviewed the resident's history and exam and pertinent patient test results. I agree with the assessment, diagnosis, and plan of care documented in the resident's note.  

## 2017-10-02 ENCOUNTER — Emergency Department (HOSPITAL_COMMUNITY): Payer: Self-pay

## 2017-10-02 ENCOUNTER — Encounter (HOSPITAL_COMMUNITY): Payer: Self-pay | Admitting: Emergency Medicine

## 2017-10-02 ENCOUNTER — Emergency Department (HOSPITAL_COMMUNITY)
Admission: EM | Admit: 2017-10-02 | Discharge: 2017-10-02 | Disposition: A | Discharge status: Home / Self Care | Payer: Self-pay | Attending: Emergency Medicine | Admitting: Emergency Medicine

## 2017-10-02 DIAGNOSIS — J449 Chronic obstructive pulmonary disease, unspecified: Secondary | ICD-10-CM | POA: Insufficient documentation

## 2017-10-02 DIAGNOSIS — E876 Hypokalemia: Secondary | ICD-10-CM | POA: Insufficient documentation

## 2017-10-02 DIAGNOSIS — Z79899 Other long term (current) drug therapy: Secondary | ICD-10-CM | POA: Insufficient documentation

## 2017-10-02 DIAGNOSIS — R0602 Shortness of breath: Secondary | ICD-10-CM | POA: Insufficient documentation

## 2017-10-02 DIAGNOSIS — Z87891 Personal history of nicotine dependence: Secondary | ICD-10-CM | POA: Insufficient documentation

## 2017-10-02 DIAGNOSIS — I10 Essential (primary) hypertension: Secondary | ICD-10-CM | POA: Insufficient documentation

## 2017-10-02 LAB — CBC WITH DIFFERENTIAL/PLATELET
BASOS PCT: 0 %
Basophils Absolute: 0 10*3/uL (ref 0.0–0.1)
EOS ABS: 0.3 10*3/uL (ref 0.0–0.7)
Eosinophils Relative: 2 %
HEMATOCRIT: 34.7 % — AB (ref 36.0–46.0)
HEMOGLOBIN: 11.2 g/dL — AB (ref 12.0–15.0)
LYMPHS ABS: 2.4 10*3/uL (ref 0.7–4.0)
Lymphocytes Relative: 19 %
MCH: 27.3 pg (ref 26.0–34.0)
MCHC: 32.3 g/dL (ref 30.0–36.0)
MCV: 84.4 fL (ref 78.0–100.0)
MONO ABS: 0.7 10*3/uL (ref 0.1–1.0)
MONOS PCT: 6 %
NEUTROS ABS: 9.4 10*3/uL — AB (ref 1.7–7.7)
NEUTROS PCT: 73 %
Platelets: 323 10*3/uL (ref 150–400)
RBC: 4.11 MIL/uL (ref 3.87–5.11)
RDW: 14.6 % (ref 11.5–15.5)
WBC: 12.8 10*3/uL — ABNORMAL HIGH (ref 4.0–10.5)

## 2017-10-02 LAB — COMPREHENSIVE METABOLIC PANEL
ALBUMIN: 3 g/dL — AB (ref 3.5–5.0)
ALK PHOS: 54 U/L (ref 38–126)
ALT: 10 U/L — AB (ref 14–54)
AST: 16 U/L (ref 15–41)
Anion gap: 10 (ref 5–15)
BUN: <5 mg/dL — ABNORMAL LOW (ref 6–20)
CALCIUM: 8.7 mg/dL — AB (ref 8.9–10.3)
CHLORIDE: 105 mmol/L (ref 101–111)
CO2: 21 mmol/L — AB (ref 22–32)
CREATININE: 0.76 mg/dL (ref 0.44–1.00)
GFR calc Af Amer: >60 mL/min (ref >60)
GFR calc non Af Amer: >60 mL/min (ref >60)
GLUCOSE: 110 mg/dL — AB (ref 65–99)
Potassium: 3.1 mmol/L — ABNORMAL LOW (ref 3.5–5.1)
SODIUM: 136 mmol/L (ref 135–145)
Total Bilirubin: 0.7 mg/dL (ref 0.3–1.2)
Total Protein: 6 g/dL — ABNORMAL LOW (ref 6.5–8.1)

## 2017-10-02 LAB — URINALYSIS, ROUTINE W REFLEX MICROSCOPIC
Bilirubin Urine: NEGATIVE
Glucose, UA: NEGATIVE mg/dL
Ketones, ur: 5 mg/dL — AB
Nitrite: POSITIVE — AB
Protein, ur: 100 mg/dL — AB
Specific Gravity, Urine: 1.01 (ref 1.005–1.030)
pH: 6 (ref 5.0–8.0)

## 2017-10-02 LAB — INFLUENZA PANEL BY PCR (TYPE A & B)
Influenza A By PCR: NEGATIVE
Influenza B By PCR: NEGATIVE

## 2017-10-02 LAB — D-DIMER, QUANTITATIVE: D-Dimer, Quant: 1.14 ug{FEU}/mL — ABNORMAL HIGH (ref 0.00–0.50)

## 2017-10-02 LAB — I-STAT BETA HCG BLOOD, ED (MC, WL, AP ONLY): I-stat hCG, quantitative: 5.4 m[IU]/mL — ABNORMAL HIGH (ref <5)

## 2017-10-02 LAB — POC URINE PREG, ED: Preg Test, Ur: NEGATIVE

## 2017-10-02 MED ORDER — POTASSIUM CHLORIDE ER 10 MEQ PO TBCR: 20.0000 meq | EXTENDED_RELEASE_TABLET | Freq: Every day | ORAL | 0 refills | Status: DC

## 2017-10-02 MED ORDER — BENZONATATE 100 MG PO CAPS: 100.0000 mg | ORAL_CAPSULE | Freq: Three times a day (TID) | ORAL | 0 refills | Status: DC

## 2017-10-02 MED ORDER — IPRATROPIUM-ALBUTEROL 0.5-2.5 (3) MG/3ML IN SOLN
3.0000 mL | Freq: Once | RESPIRATORY_TRACT | Status: AC
  Administered 2017-10-02: 3 mL via RESPIRATORY_TRACT
  Filled 2017-10-02: qty 3

## 2017-10-02 MED ORDER — POTASSIUM CHLORIDE CRYS ER 20 MEQ PO TBCR
40.0000 meq | EXTENDED_RELEASE_TABLET | Freq: Once | ORAL | Status: AC
  Administered 2017-10-02: 40 meq via ORAL
  Filled 2017-10-02: qty 2

## 2017-10-02 MED ORDER — AZITHROMYCIN 250 MG PO TABS: 250.0000 mg | ORAL_TABLET | Freq: Every day | ORAL | 0 refills | Status: DC

## 2017-10-02 MED ORDER — ALBUTEROL SULFATE 1.25 MG/3ML IN NEBU: 1.0000 | INHALATION_SOLUTION | Freq: Four times a day (QID) | RESPIRATORY_TRACT | 12 refills | Status: DC | PRN

## 2017-10-02 MED ORDER — SODIUM CHLORIDE 0.9 % IV BOLUS (SEPSIS)
500.0000 mL | Freq: Once | INTRAVENOUS | Status: AC
  Administered 2017-10-02: 500 mL via INTRAVENOUS

## 2017-10-02 MED ORDER — IOPAMIDOL (ISOVUE-370) INJECTION 76%
INTRAVENOUS | Status: AC
  Administered 2017-10-02: 70 mL
  Filled 2017-10-02: qty 100

## 2017-10-02 MED ORDER — METHYLPREDNISOLONE SODIUM SUCC 125 MG IJ SOLR
125.0000 mg | Freq: Once | INTRAMUSCULAR | Status: AC
  Administered 2017-10-02: 125 mg via INTRAVENOUS
  Filled 2017-10-02: qty 2

## 2017-10-02 NOTE — Discharge Instructions (Signed)
Your symptoms may be caused by virus, however, due to your history of COPD antibiotic coverage is indicated. Additional treatment is symptomatic care and it is important to note that these symptoms may last for 7-14 days, sometimes longer.   Hand washing: Wash your hands throughout the day, but especially before and after touching the face, using the restroom, sneezing, coughing, or touching surfaces that have been coughed or sneezed upon. Hydration: Symptoms will be intensified and complicated by dehydration. Dehydration can also extend the duration of symptoms. Drink plenty of fluids and get plenty of rest. You should be drinking at least half a liter of water an hour to stay hydrated. Electrolyte drinks are also encouraged. You should be drinking enough fluids to make your urine light yellow, almost clear. If this is not the case, you are not drinking enough water. Please note that some of the treatments indicated below will not be effective if you are not adequately hydrated. Pain or fever: Ibuprofen, Naproxen, or Tylenol for pain or fever.  Cough: Use the Tessalon for cough.  Congestion: Plain Mucinex may help relieve congestion. Saline sinus rinses and saline nasal sprays may also help relieve congestion. If you do not have heart problems or an allergy to such medications, you may also try phenylephrine or Sudafed. Sore throat: Warm liquids or Chloraseptic spray may help soothe a sore throat. Gargle twice a day with a salt water solution made from a half teaspoon of salt in a cup of warm water.  Antibiotic: Please take all of your antibiotics until finished!   You may develop abdominal discomfort or diarrhea from the antibiotic.  You may help offset this with probiotics which you can buy or get in yogurt. Do not eat or take the probiotics until 2 hours after your antibiotic.  Follow up: Follow up with a primary care provider, as needed, for any future management of this issue. Return: Return to the  ED for any worsening symptoms.  Hypokalemia (Low Potassium):  Your potassium today was noted to be lower than normal (3.1). Please take to supplement, as prescribed, and have this value retested in 1 week by your PCP.

## 2017-10-02 NOTE — ED Notes (Signed)
Patient transported to CT 

## 2017-10-02 NOTE — ED Notes (Signed)
Pt returned to room from imaging dept.  

## 2017-10-02 NOTE — ED Notes (Signed)
Initial O2 sat at bedside was 100%, fell to 98% when ambulating, returned to 99% when back at bedside

## 2017-10-02 NOTE — ED Triage Notes (Signed)
PT states sick since Saturday, lost her voice on Monday. Hx of Asthma. States she has been doing breathing treatments with no relief. Last treatment was 30 minutes ago with no relief.

## 2017-10-02 NOTE — ED Provider Notes (Signed)
[facility] EMERGENCY DEPART[facility] Provider Note   CSN: [id number] Arrival date & time: 10/02/17  [id number]     History   Chief Complaint Chief Complaint  Patient presents with  . Asthma  . Shortness of Breath    HPI Rachel BracketLori A Leon is a 51 y.o. female.  HPI   [name] is a 51 y.o. female, with a history of cOPD, HTN, and GERD, presenting to the ED with shortness of breath. Patient originally states that she began to have shortness of breath and a coughworsening since October 13. She later adds that she had a cross-[place] plane ride out of state 2 weeks ago, after which she began to have "difficulty taking a breath in." She attributed this to the cold weather. Patient endorses nonproductive cough, sore throat that has resolved, subjective fever, and body aches. Denies chest pain, N/V/D, rash, abdominal pain, dizziness, headache, or any other complaints.   Past Medical History:  Diagnosis Date  . COPD (chronic obstructive pulmonary disease) (HCC)    non-oxygen dependent  . GERD (gastroesophageal reflux disease)   . Hypertension     Patient Active Problem List   Diagnosis Date Noted  . Dyspepsia 07/22/2017  . Left hip pain 03/01/2015  . Pruritic rash 07/13/2014  . COPD (chronic obstructive pulmonary disease) (HCC) 01/19/2014  . Hyperlipidemia 05/27/2013  . Obesity, Class III, BMI 40-49.9 (morbid obesity) (HCC) 05/27/2013  . Ischemic colitis (HCC) 11/20/2012  . HTN (hypertension) 12/21/2011  . Asthma 12/21/2011  . Menorrhagia 12/21/2011  . Routine adult health maintenance 12/21/2011  . GERD (gastroesophageal reflux disease) 12/21/2011    Past Surgical History:  Procedure Laterality Date  . COLONOSCOPY  11/19/2012   Procedure: COLONOSCOPY;  Surgeon: Vertell NovakJames L Edwards Jr., MD;  [facility];  Service: Endoscopy;  Laterality: N/A;  . TUBAL LIGATION      OB History    No data available       Home Medications    Prior to Admission  medications   Medication Sig Start Date End Date Taking? Authorizing Provider  albuterol (ACCUNEB) 1.25 MG/3ML nebulizer solution Take 1 ampule by nebulization every 6 (six) hours as needed.   Yes [provider]  esomeprazole (NEXIUM) 20 MG capsule Take 40 mg by mouth every evening.   Yes [provider]  lisinopril-hydrochlorothiazide (PRINZIDE,ZESTORETIC) 20-12.5 MG tablet Take 2 tablets by mouth daily. 10/15/16  Yes Darreld McleanPatel, Vishal, MD  norgestimate-ethinyl estradiol (SPRINTEC 28) 0.25-35 MG-MCG tablet Take 1 tablet by mouth daily. 03/05/17  Yes Eulah PontBlum, Nina, MD  Phenylephrine-DM-GG-APAP (TYLENOL COLD/FLU SEVERE) 5-10-200-325 MG TABS Take 1 tablet by mouth daily as needed (cold sx).   Yes [provider]  Pseudoeph-Doxylamine-DM-APAP (NYQUIL PO) Take 1 capsule by mouth at bedtime as needed (pain).   Yes [provider]  albuterol (ACCUNEB) 1.25 MG/3ML nebulizer solution Take 3 mLs (1.25 mg total) by nebulization every 6 (six) hours as needed for wheezing. 10/02/17   Joy, Shawn C, PA-C  azithromycin (ZITHROMAX) 250 MG tablet Take 1 tablet (250 mg total) by mouth daily. Take first 2 tablets together, then 1 every day until finished. 10/02/17   Joy, Shawn C, PA-C  benzonatate (TESSALON) 100 MG capsule Take 1 capsule (100 mg total) by mouth every 8 (eight) hours. 10/02/17   Joy, Shawn C, PA-C  ibuprofen (ADVIL,MOTRIN) 800 MG tablet Take 1 tablet (800 mg total) by mouth every 8 (eight) hours as needed for mild pain. 03/05/17   Eulah PontBlum, Nina, MD  pantoprazole (  PROTONIX) 40 MG tablet Take 1 tablet (40 mg total) by mouth daily. Patient not taking: Reported on 10/02/2017 07/22/17 07/22/18  [name], MD  potassium chloride (K-DUR) 10 MEQ tablet Take 2 tablets (20 mEq total) by mouth daily. 10/02/17 10/07/17  Anselm Pancoast, PA-C    Family History Family History  Problem Relation Age of Onset  . Hypertension Mother   . Stroke Mother   . Hypertension Father   . Heart  disease Father   . COPD Sister   . Heart disease Brother   . Hypertension Brother   . Cancer Maternal Aunt   . Cancer Paternal Aunt   . Cancer Maternal Grandmother   . Heart disease Maternal Grandfather   . Hypertension Maternal Grandfather   . Heart disease Paternal Grandmother   . Diabetes Paternal Grandmother   . Heart disease Paternal Grandfather   . Hypertension Paternal Grandfather     Social History Social History  Substance Use Topics  . Smoking status: Former Smoker    Packs/day: 1.00    Years: 25.00    Types: Cigarettes    Quit date: 01/05/2008  . Smokeless tobacco: Never Used  . Alcohol use No     Allergies   Sulfa drugs cross reactors   Review of Systems Review of Systems  Constitutional: Negative for chills, diaphoresis and fever.  Respiratory: Positive for cough and shortness of breath.   Cardiovascular: Negative for chest pain.  Gastrointestinal: Negative for abdominal pain, diarrhea, nausea and vomiting.  Neurological: Negative for syncope, weakness and headaches.  All other systems reviewed and are negative.    Physical Exam Updated Vital Signs BP 137/76   Pulse 98   Temp 98.4 F (36.9 C) (Oral)   Resp (!) 24   LMP 10/02/2017   SpO2 100%   Physical Exam  Constitutional: She appears well-developed and well-nourished. No distress.  HENT:  Head: Normocephalic and atraumatic.  Eyes: Conjunctivae are normal.  Neck: Neck supple.  Cardiovascular: Normal rate, regular rhythm, normal heart sounds and intact distal pulses.   Pulmonary/Chest: Breath sounds normal. Tachypnea noted.  Minor tachypnea noted on initial evaluation, but resolved with DuoNeb. Patient is able to speak in full sentences without difficulty.  Abdominal: Soft. There is no tenderness. There is no guarding.  Musculoskeletal: She exhibits no edema.  Lymphadenopathy:    She has no cervical adenopathy.  Neurological: She is alert.  Skin: Skin is warm and dry. Capillary refill  takes less than 2 seconds. She is not diaphoretic.  Psychiatric: She has a normal mood and affect. Her behavior is normal.  Nursing note and vitals reviewed.    ED Treatments / Results  Labs (all labs ordered are listed, but only abnormal results are displayed) Labs Reviewed  COMPREHENSIVE METABOLIC PANEL - Abnormal; Notable for the following:       Result Value   Potassium 3.1 (*)    CO2 21 (*)    Glucose, Bld 110 (*)    BUN <5 (*)    Calcium 8.7 (*)    Total Protein 6.0 (*)    Albumin 3.0 (*)    ALT 10 (*)    All other components within normal limits  CBC WITH DIFFERENTIAL/PLATELET - Abnormal; Notable for the following:    WBC 12.8 (*)    Hemoglobin 11.2 (*)    HCT 34.7 (*)    Neutro Abs 9.4 (*)    All other components within normal limits  D-DIMER, QUANTITATIVE (NOT AT [facility]) - Abnormal;  Notable for the following:    D-Dimer, Quant 1.14 (*)    All other components within normal limits  URINALYSIS, ROUTINE W REFLEX MICROSCOPIC - Abnormal; Notable for the following:    APPearance CLOUDY (*)    Hgb urine dipstick LARGE (*)    Ketones, ur 5 (*)    Protein, ur 100 (*)    Nitrite POSITIVE (*)    Leukocytes, UA TRACE (*)    Bacteria, UA FEW (*)    Squamous Epithelial / LPF 0-5 (*)    All other components within normal limits  I-STAT BETA HCG BLOOD, ED ([facility], [facility], [facility] ONLY) - Abnormal; Notable for the following:    I-stat hCG, quantitative 5.4 (*)    All other components within normal limits  INFLUENZA PANEL BY PCR (TYPE A & B)  POC URINE PREG, ED    EKG  EKG Interpretation  Date/Time:  Wednesday October 02 2017 05:38:00 EDT Ventricular Rate:  89 PR Interval:    QRS Duration: 82 QT Interval:  386 QTC Calculation: 470 R Axis:   57 Text Interpretation:  Sinus rhythm Normal ECG Confirmed by Gilda Crease [phone]) on 10/02/2017 6:13:41 AM       Radiology Dg Chest 2 View  Result Date: 10/02/2017 CLINICAL DATA:  Cough, chest pain, shortness of breath EXAM:  CHEST  2 VIEW COMPARISON:  05/06/2012 FINDINGS: Lungs are clear.  No pleural effusion or pneumothorax. The heart is normal in size. Mild degenerative changes of the visualized thoracolumbar spine. IMPRESSION: Normal chest radiographs. Electronically Signed   By: Charline Bills M.D.   On: 10/02/2017 07:39   Ct Angio Chest Pe W And/or Wo Contrast  Result Date: 10/02/2017 CLINICAL DATA:  Shortness of breath. EXAM: CT ANGIOGRAPHY CHEST WITH CONTRAST TECHNIQUE: Multidetector CT imaging of the chest was performed using the standard protocol during bolus administration of intravenous contrast. Multiplanar CT image reconstructions and MIPs were obtained to evaluate the vascular anatomy. CONTRAST:  70 cc Isovue 370 IV COMPARISON:  Chest x-ray today FINDINGS: Cardiovascular: Heart is normal size. Aorta is normal caliber. Aortic arch calcifications. No evidence of pulmonary embolus. Mediastinum/Nodes: No mediastinal, hilar, or axillary adenopathy. Trachea and esophagus are unremarkable. Thyroid unremarkable. Lungs/Pleura: Moderate emphysematous changes in the lung apices. No confluent airspace opacities or effusions. Upper Abdomen: Imaging into the upper abdomen shows no acute findings. Musculoskeletal: Chest wall soft tissues are unremarkable. No acute bony abnormality. Review of the MIP images confirms the above findings. IMPRESSION: No evidence of pulmonary embolus. No acute findings in the chest. Aortic Atherosclerosis (ICD10-I70.0) and Emphysema (ICD10-J43.9). Electronically Signed   By: Charlett Nose M.D.   On: 10/02/2017 10:06    Procedures Procedures (including critical care time)  Medications Ordered in ED Medications  sodium chloride 0.9 % bolus 500 mL (0 mLs Intravenous Stopped 10/02/17 0800)  ipratropium-albuterol (DUONEB) 0.5-2.5 (3) MG/3ML nebulizer solution 3 mL (3 mLs Nebulization Given 10/02/17 0651)  methylPREDNISolone sodium succinate (SOLU-MEDROL) 125 mg/2 mL injection 125 mg (125 mg  Intravenous Given 10/02/17 0651)  potassium chloride SA (K-DUR,KLOR-CON) CR tablet 40 mEq (40 mEq Oral Given 10/02/17 0800)  iopamidol (ISOVUE-370) 76 % injection (70 mLs  Contrast Given 10/02/17 1002)     Initial Impression / Assessment and Plan / ED Course  I have reviewed the triage vital signs and the nursing notes.  Pertinent labs & imaging results that were available during my care of the patient were reviewed by me and considered in my medical decision making (see chart  for details).  Clinical Course as of Oct 02 1646  Wed Oct 02, 2017  0740 Patient reassessed. States that her breathing has improved "a little bit." Lungs sounds are still clear to auscultation. SPO2 100% while patient rest. Discussed lab results. Patient agrees to plan for CT PE study.  [SJ]    Clinical Course User Index [SJ] Joy, Shawn C, PA-C    Patient presents with complaint of shortness of breath. She does have some accompanying complaints that would point to infectious cause, however, lungs sounds are clear. Patient with recent long distance travel, sudden onset shortness breath, and hormonal therapy. D-dimer positive. CT PE without evidence of PE. Shortness of breath improved with DuoNeb. Patient ambulated around the department without exacerbation of shortness of breath, onset of chest pain, or other additional symptoms. SPO2 between 98-100%. Patient requested refills of her expired albuterol nebulizer solution and this was honored. UA abnormalities noted. Patient denies urinary symptoms. The patient was given instructions for home care as well as return precautions. Patient voices understanding of these instructions, accepts the plan, and is comfortable with discharge.  Final Clinical Impressions(s) / ED Diagnoses   Final diagnoses:  Shortness of breath  Hypokalemia    New Prescriptions Discharge Medication List as of 10/02/2017 11:24 AM    START taking these medications   Details  !! albuterol  (ACCUNEB) 1.25 MG/3ML nebulizer solution Take 3 mLs (1.25 mg total) by nebulization every 6 (six) hours as needed for wheezing., Starting Wed 10/02/2017, Print    azithromycin (ZITHROMAX) 250 MG tablet Take 1 tablet (250 mg total) by mouth daily. Take first 2 tablets together, then 1 every day until finished., Starting Wed 10/02/2017, Print    benzonatate (TESSALON) 100 MG capsule Take 1 capsule (100 mg total) by mouth every 8 (eight) hours., Starting Wed 10/02/2017, Print    potassium chloride (K-DUR) 10 MEQ tablet Take 2 tablets (20 mEq total) by mouth daily., Starting Wed 10/02/2017, Until Mon 10/07/2017, Print     !! - Potential duplicate medications found. Please discuss with provider.       Anselm Pancoast, PA-C 10/02/17 1650    Gilda Crease, MD 10/06/17 [phone]

## 2017-10-21 ENCOUNTER — Other Ambulatory Visit: Payer: Self-pay | Admitting: Internal Medicine

## 2017-10-21 DIAGNOSIS — I10 Essential (primary) hypertension: Secondary | ICD-10-CM

## 2017-10-31 ENCOUNTER — Other Ambulatory Visit: Payer: Self-pay | Admitting: Internal Medicine

## 2017-10-31 NOTE — Telephone Encounter (Signed)
REFILL ON BLOOD PRESSURE MED, [facility] ON [address]

## 2017-10-31 NOTE — Telephone Encounter (Signed)
Pt informed script there and must keeep appt

## 2017-11-06 NOTE — Telephone Encounter (Signed)
Patient called stating [facility] does not have med ( [id number]). Called [facility], verified rx is received & will need to wait at least 2 hrs to fill. Notified patient.

## 2017-11-26 ENCOUNTER — Encounter: Payer: Self-pay | Admitting: Internal Medicine

## 2017-12-02 ENCOUNTER — Other Ambulatory Visit: Payer: Self-pay

## 2017-12-02 ENCOUNTER — Encounter: Payer: Self-pay | Admitting: Internal Medicine

## 2017-12-02 ENCOUNTER — Ambulatory Visit: Payer: Self-pay | Admitting: Internal Medicine

## 2017-12-02 VITALS — BP 123/77 | HR 84 | Temp 97.5°F | Ht 63.0 in | Wt 222.5 lb

## 2017-12-02 DIAGNOSIS — Z1239 Encounter for other screening for malignant neoplasm of breast: Secondary | ICD-10-CM

## 2017-12-02 DIAGNOSIS — E876 Hypokalemia: Secondary | ICD-10-CM

## 2017-12-02 DIAGNOSIS — M25552 Pain in left hip: Secondary | ICD-10-CM

## 2017-12-02 DIAGNOSIS — Z23 Encounter for immunization: Secondary | ICD-10-CM

## 2017-12-02 DIAGNOSIS — Z1231 Encounter for screening mammogram for malignant neoplasm of breast: Secondary | ICD-10-CM

## 2017-12-02 DIAGNOSIS — I10 Essential (primary) hypertension: Secondary | ICD-10-CM

## 2017-12-02 DIAGNOSIS — E7800 Pure hypercholesterolemia, unspecified: Secondary | ICD-10-CM

## 2017-12-02 DIAGNOSIS — N920 Excessive and frequent menstruation with regular cycle: Secondary | ICD-10-CM

## 2017-12-02 DIAGNOSIS — D649 Anemia, unspecified: Secondary | ICD-10-CM

## 2017-12-02 DIAGNOSIS — K219 Gastro-esophageal reflux disease without esophagitis: Secondary | ICD-10-CM

## 2017-12-02 NOTE — Patient Instructions (Addendum)
FOLLOW-UP INSTRUCTIONS When: February in the acute care clinic ( pap smear ) and in one year for blood pressure check ( with Dr. Obie DredgeBlum)  For: pap smear  What to bring: medication bottles   - Please call our clinic if you have any problems or questions, we may be able to help you and keep you from a long emergency room wait. Our clinic and after hours phone number is [phone]

## 2017-12-02 NOTE — Progress Notes (Signed)
CC: left hip pain   HPI:  [name] is a 51 y.o. with PMH COPD, Menorrhagia, HTN, Asthma, HLD, Obesity, GERD, ischemic colitis who presents for follow up of left hip pain. Please see the assessment and plans for the status of the patient chronic medical problems.   Past Medical History:  Diagnosis Date  . COPD (chronic obstructive pulmonary disease) (HCC)    non-oxygen dependent  . GERD (gastroesophageal reflux disease)   . Hypertension    Review of Systems:  Refer to history of present illness and assessment and plans for pertinent review of systems, all others reviewed and negative  Physical Exam:  Vitals:   12/02/17 1554  BP: 123/77  Pulse: 84  Temp: (!) 97.5 F (36.4 C)  TempSrc: Oral  SpO2: 100%  Weight: 222 lb 8 oz (100.9 kg)  Height: 5\' 3"  (1.6 m)   General: well appearing, no acute distress  Cardiac: RRR, no murmur appreciated  Pulm: clear to auscultation Left hip: no apparent deformity on visual inspection, pain with flexion of the left hip, tenderness to palpation over the greater trochanter   Assessment & Plan:   Anemia  Hgb 11.2 incidentally on last BMP. She's had mildly low hemoglobin in the past. She still has menstrual periods although these are becoming less frequent. She denies symptoms related to anemia today.  Assessment: Mildly low hemoglobin is most likely secondary to losses during her menstrual period. Would proceed with iron studies if anemia continues after menopause.  - continue to monitor   HTN  BP 123/77 today which is well controlled and below goal on lisinopril and HCTZ. Hypokalemia on last BMP when she was in the ED for shortness of breath and had received a DuoNeb treatment so she was discharged with an Rx for k-dur and I will recheck a BMP today. Kidney function WNL on last BMP.  - follow up BMP  - lisinopril - HCTZ 40-25 qd  GERD  Symptoms controlled with Nexium twice daily. She does experience heartburn when she doesn't take  Nexium.  - continue nexium   Hyperlipidemia  LDL 155 on last lipid panel in 2016. Significant family history of cardiovascular disease - father died of MI at age 50 and mother suffered multiple strokes. ASCVD 10 year risk is 3.3% which does not suggest a need to begin statin therapy.  - continue to monitor   Menorrhalgia  Reports bleeding is under control when she takes oral contraceptive. She request a refill of OCP today.  - Refilled Sprintec   Lateral Hip pain  Has been experiencing lateral left hip pain for years. The pain hip is tender with palpation over the greater trochanter. She pain is worse with walking. The pain does not keep her up at night. She denies night sweats, weight loss, groin pain, paresthesia, or back pain. She does have left knee pain but relates this to being an after affect of the hip pain. 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 Consent was obtained for the procedure. The lateral hip was prepped with iodine. Using a 22 gauge needle the trochanteric bursa was injected with 4 mL 1% lidocaine and 1 mL of triamcinolone (KENALOG) 40mg /ml. The needle was removed and a dressing was applied.  Complications:  None; patient tolerated the procedure well.  Healthcare maintenance  - Due for pap smear, will schedule a follow up appointment [facility]  - Referral for mammogram  - Flu  vaccination today   See Encounters Tab for problem based charting.  Patient discussed with Dr. Cleda DaubE. Hoffman

## 2017-12-03 LAB — BMP8+ANION GAP
Anion Gap: 14 mmol/L (ref 10.0–18.0)
BUN / CREAT RATIO: 9 (ref 9–23)
BUN: 6 mg/dL (ref 6–24)
CO2: 21 mmol/L (ref 20–29)
Calcium: 8.9 mg/dL (ref 8.7–10.2)
Chloride: 105 mmol/L (ref 96–106)
Creatinine, Ser: 0.65 mg/dL (ref 0.57–1.00)
GFR calc Af Amer: 120 mL/min/{1.73_m2} (ref >59)
GFR, EST NON AFRICAN AMERICAN: 104 mL/min/{1.73_m2} (ref >59)
Glucose: 111 mg/dL — ABNORMAL HIGH (ref 65–99)
Potassium: 3.6 mmol/L (ref 3.5–5.2)
SODIUM: 140 mmol/L (ref 134–144)

## 2017-12-04 ENCOUNTER — Encounter: Payer: Self-pay | Admitting: Internal Medicine

## 2017-12-04 DIAGNOSIS — D649 Anemia, unspecified: Secondary | ICD-10-CM | POA: Insufficient documentation

## 2017-12-04 MED ORDER — LISINOPRIL-HYDROCHLOROTHIAZIDE 20-12.5 MG PO TABS: 2.0000 | ORAL_TABLET | Freq: Every day | ORAL | 3 refills | Status: DC

## 2017-12-04 MED ORDER — NORGESTIMATE-ETH ESTRADIOL 0.25-35 MG-MCG PO TABS: 1.0000 | ORAL_TABLET | Freq: Every day | ORAL | 11 refills | Status: DC

## 2017-12-04 NOTE — Assessment & Plan Note (Signed)
LDL 155 on last lipid panel in 2016. Significant family history of cardiovascular disease - father died of MI at age 50 and mother suffered multiple strokes. ASCVD 10 year risk is 3.3% which does not suggest a need to begin statin therapy.  - continue to monitor

## 2017-12-04 NOTE — Assessment & Plan Note (Signed)
BP 123/77 today which is well controlled and below goal on lisinopril and HCTZ. Hypokalemia on last BMP when she was in the ED for shortness of breath and had received a DuoNeb treatment so she was discharged with an Rx for k-dur and I will recheck a BMP today. Kidney function WNL on last BMP.  - follow up BMP  - lisinopril - HCTZ 40-25 qd

## 2017-12-04 NOTE — Assessment & Plan Note (Signed)
Hgb 11.2 incidentally on last BMP. She's had mildly low hemoglobin in the past. She still has menstrual periods although these are becoming less frequent. She denies symptoms related to anemia today.  Assessment: Mildly low hemoglobin is most likely secondary to losses during her menstrual period. Would proceed with iron studies if anemia continues after menopause.  - continue to monitor

## 2017-12-04 NOTE — Progress Notes (Signed)
Internal Medicine Clinic Attending  I saw and evaluated the patient.  I personally confirmed the key portions of the history and exam documented by Dr. Blum and I reviewed pertinent patient test results.  The assessment, diagnosis, and plan were formulated together and I agree with the documentation in the resident's note.   I was present for the entire procedure.  

## 2017-12-04 NOTE — Assessment & Plan Note (Signed)
Has been experiencing lateral left hip pain for years. The pain hip is tender with palpation over the greater trochanter. She pain is worse with walking. The pain does not keep her up at night. She denies night sweats, weight loss, groin pain, paresthesia, or back pain. She does have left knee pain but relates this to being an after affect of the hip pain. 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

## 2017-12-04 NOTE — Assessment & Plan Note (Signed)
Symptoms controlled with Nexium twice daily. She does experience heartburn when she doesn't take Nexium.  - continue nexium

## 2017-12-04 NOTE — Assessment & Plan Note (Signed)
Reports bleeding is under control when she takes oral contraceptive. She request a refill of OCP today.  - Refilled Sprintec

## 2018-01-23 ENCOUNTER — Encounter (HOSPITAL_COMMUNITY): Payer: Self-pay | Admitting: Emergency Medicine

## 2018-01-23 ENCOUNTER — Other Ambulatory Visit: Payer: Self-pay

## 2018-01-23 ENCOUNTER — Emergency Department (HOSPITAL_COMMUNITY): Payer: Self-pay

## 2018-01-23 DIAGNOSIS — J449 Chronic obstructive pulmonary disease, unspecified: Secondary | ICD-10-CM | POA: Insufficient documentation

## 2018-01-23 DIAGNOSIS — Z87891 Personal history of nicotine dependence: Secondary | ICD-10-CM | POA: Insufficient documentation

## 2018-01-23 DIAGNOSIS — I10 Essential (primary) hypertension: Secondary | ICD-10-CM | POA: Insufficient documentation

## 2018-01-23 DIAGNOSIS — Z79899 Other long term (current) drug therapy: Secondary | ICD-10-CM | POA: Insufficient documentation

## 2018-01-23 DIAGNOSIS — J45909 Unspecified asthma, uncomplicated: Secondary | ICD-10-CM | POA: Insufficient documentation

## 2018-01-23 DIAGNOSIS — R1084 Generalized abdominal pain: Secondary | ICD-10-CM | POA: Insufficient documentation

## 2018-01-23 DIAGNOSIS — R112 Nausea with vomiting, unspecified: Secondary | ICD-10-CM | POA: Insufficient documentation

## 2018-01-23 LAB — COMPREHENSIVE METABOLIC PANEL
ALT: 12 U/L — AB (ref 14–54)
AST: 20 U/L (ref 15–41)
Albumin: 3.4 g/dL — ABNORMAL LOW (ref 3.5–5.0)
Alkaline Phosphatase: 45 U/L (ref 38–126)
Anion gap: 8 (ref 5–15)
BUN: 6 mg/dL (ref 6–20)
CHLORIDE: 104 mmol/L (ref 101–111)
CO2: 24 mmol/L (ref 22–32)
Calcium: 8.6 mg/dL — ABNORMAL LOW (ref 8.9–10.3)
Creatinine, Ser: 0.66 mg/dL (ref 0.44–1.00)
Glucose, Bld: 99 mg/dL (ref 65–99)
POTASSIUM: 3.2 mmol/L — AB (ref 3.5–5.1)
Sodium: 136 mmol/L (ref 135–145)
Total Bilirubin: 0.7 mg/dL (ref 0.3–1.2)
Total Protein: 6.7 g/dL (ref 6.5–8.1)

## 2018-01-23 LAB — CBC
HEMATOCRIT: 36.3 % (ref 36.0–46.0)
HEMOGLOBIN: 11.8 g/dL — AB (ref 12.0–15.0)
MCH: 27.1 pg (ref 26.0–34.0)
MCHC: 32.5 g/dL (ref 30.0–36.0)
MCV: 83.4 fL (ref 78.0–100.0)
Platelets: 289 10*3/uL (ref 150–400)
RBC: 4.35 MIL/uL (ref 3.87–5.11)
RDW: 14.6 % (ref 11.5–15.5)
WBC: 7.2 10*3/uL (ref 4.0–10.5)

## 2018-01-23 LAB — I-STAT BETA HCG BLOOD, ED (MC, WL, AP ONLY): I-stat hCG, quantitative: <5 m[IU]/mL (ref <5)

## 2018-01-23 LAB — LIPASE, BLOOD: Lipase: 21 U/L (ref 11–51)

## 2018-01-23 LAB — I-STAT CG4 LACTIC ACID, ED: LACTIC ACID, VENOUS: 1.43 mmol/L (ref 0.5–1.9)

## 2018-01-23 NOTE — ED Triage Notes (Addendum)
Pt complaint of abdominal pain with n/v/d onset this morning. Pt continues to verbalizes cough and fever; last took tylenol at 1430.

## 2018-01-24 ENCOUNTER — Emergency Department (HOSPITAL_COMMUNITY): Payer: Self-pay

## 2018-01-24 ENCOUNTER — Encounter (HOSPITAL_COMMUNITY): Payer: Self-pay | Admitting: Emergency Medicine

## 2018-01-24 ENCOUNTER — Emergency Department (HOSPITAL_COMMUNITY)
Admission: EM | Admit: 2018-01-24 | Discharge: 2018-01-24 | Disposition: A | Discharge status: Home / Self Care | Payer: Self-pay | Attending: Emergency Medicine | Admitting: Emergency Medicine

## 2018-01-24 DIAGNOSIS — R197 Diarrhea, unspecified: Secondary | ICD-10-CM

## 2018-01-24 DIAGNOSIS — R112 Nausea with vomiting, unspecified: Secondary | ICD-10-CM

## 2018-01-24 LAB — URINALYSIS, ROUTINE W REFLEX MICROSCOPIC
Bacteria, UA: NONE SEEN
Bilirubin Urine: NEGATIVE
GLUCOSE, UA: NEGATIVE mg/dL
Ketones, ur: 20 mg/dL — AB
Leukocytes, UA: NEGATIVE
NITRITE: NEGATIVE
PH: 6 (ref 5.0–8.0)
PROTEIN: NEGATIVE mg/dL
SPECIFIC GRAVITY, URINE: 1.023 (ref 1.005–1.030)

## 2018-01-24 MED ORDER — IOPAMIDOL (ISOVUE-300) INJECTION 61%
100.0000 mL | Freq: Once | INTRAVENOUS | Status: AC | PRN
  Administered 2018-01-24: 100 mL via INTRAVENOUS

## 2018-01-24 MED ORDER — DICYCLOMINE HCL 10 MG/ML IM SOLN
20.0000 mg | Freq: Once | INTRAMUSCULAR | Status: AC
  Administered 2018-01-24: 20 mg via INTRAMUSCULAR
  Filled 2018-01-24: qty 2

## 2018-01-24 MED ORDER — ONDANSETRON 8 MG PO TBDP: ORAL_TABLET | ORAL | 0 refills | Status: DC

## 2018-01-24 MED ORDER — KETOROLAC TROMETHAMINE 30 MG/ML IJ SOLN
15.0000 mg | Freq: Once | INTRAMUSCULAR | Status: AC
  Administered 2018-01-24: 15 mg via INTRAVENOUS
  Filled 2018-01-24: qty 1

## 2018-01-24 MED ORDER — SODIUM CHLORIDE 0.9 % IJ SOLN
INTRAMUSCULAR | Status: AC
  Filled 2018-01-24: qty 50

## 2018-01-24 MED ORDER — IOPAMIDOL (ISOVUE-300) INJECTION 61%
INTRAVENOUS | Status: AC
  Administered 2018-01-24: 100 mL via INTRAVENOUS
  Filled 2018-01-24: qty 100

## 2018-01-24 MED ORDER — SODIUM CHLORIDE 0.9 % IV BOLUS (SEPSIS)
1000.0000 mL | Freq: Once | INTRAVENOUS | Status: AC
Start: 2018-01-24 — End: 2018-01-24
  Administered 2018-01-24: 1000 mL via INTRAVENOUS

## 2018-01-24 MED ORDER — ONDANSETRON HCL 4 MG/2ML IJ SOLN
4.0000 mg | Freq: Once | INTRAMUSCULAR | Status: AC
  Administered 2018-01-24: 4 mg via INTRAVENOUS
  Filled 2018-01-24: qty 2

## 2018-01-24 NOTE — ED Provider Notes (Signed)
[facility]-EMERGENCY DEPT Provider Note   CSN: [id number] Arrival date & time: 01/23/18  1643     History   Chief Complaint Chief Complaint  Patient presents with  . Abdominal Pain    HPI [name] is a 52 y.o. female.  The history is provided by the patient.  Diarrhea   This is a new problem. The current episode started 6 to 12 hours ago. The problem occurs 2 to 4 times per day. The problem has not changed since onset.The stool consistency is described as watery. There has been no fever. Associated symptoms include vomiting and myalgias. Pertinent negatives include no cough. She has tried nothing for the symptoms. The treatment provided no relief. Her past medical history does not include bowel resection.  Emesis   This is a new problem. The current episode started 6 to 12 hours ago. The problem occurs 2 to 4 times per day. The problem has not changed since onset.The emesis has an appearance of stomach contents. There has been no fever. The fever has been present for less than 1 day. Associated symptoms include diarrhea and myalgias. Pertinent negatives include no cough and no fever. Risk factors include ill contacts.  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    Past Medical History:  Diagnosis Date  . COPD (chronic obstructive pulmonary disease) (HCC)    non-oxygen dependent  . GERD (gastroesophageal reflux disease)   . Hypertension     Patient Active Problem List   Diagnosis Date Noted  . Anemia 12/04/2017  . Dyspepsia 07/22/2017  . Left hip pain 03/01/2015  . COPD (chronic obstructive pulmonary disease) (HCC) 01/19/2014  . Hyperlipidemia 05/27/2013  . Obesity, Class III, BMI 40-49.9 (morbid obesity) (HCC) 05/27/2013  . Ischemic colitis (HCC) 11/20/2012  . HTN (hypertension) 12/21/2011  . Asthma 12/21/2011  . Menorrhagia 12/21/2011  . Routine adult health maintenance 12/21/2011  . GERD  (gastroesophageal reflux disease) 12/21/2011    Past Surgical History:  Procedure Laterality Date  . COLONOSCOPY  11/19/2012   Procedure: COLONOSCOPY;  Surgeon: Vertell Novak., MD;  Location: [facility] ENDOSCOPY;  Service: Endoscopy;  Laterality: N/A;  . TUBAL LIGATION      OB History    No data available       Home Medications    Prior to Admission medications   Medication Sig Start Date End Date Taking? Authorizing Provider  albuterol (ACCUNEB) 1.25 MG/3ML nebulizer solution Take 3 mLs (1.25 mg total) by nebulization every 6 (six) hours as needed for wheezing. 10/02/17   Joy, Shawn C, PA-C  esomeprazole (NEXIUM) 20 MG capsule Take 40 mg by mouth every evening.    [provider]  lisinopril-hydrochlorothiazide (PRINZIDE,ZESTORETIC) 20-12.5 MG tablet Take 2 tablets by mouth daily. 12/04/17   Eulah Pont, MD  norgestimate-ethinyl estradiol (SPRINTEC 28) 0.25-35 MG-MCG tablet Take 1 tablet by mouth daily. 12/04/17   Eulah Pont, MD    Family History Family History  Problem Relation Age of Onset  . Hypertension Mother   . Stroke Mother   . Hypertension Father   . Heart disease Father   . COPD Sister   . Heart disease Brother   . Hypertension Brother   . Cancer Maternal Aunt   . Cancer Paternal Aunt   . Cancer Maternal Grandmother   . Heart disease Maternal Grandfather   . Hypertension Maternal Grandfather   . Heart disease Paternal Grandmother   .  Diabetes Paternal Grandmother   . Heart disease Paternal Grandfather   . Hypertension Paternal Grandfather     Social History Social History   Tobacco Use  . Smoking status: Former Smoker    Packs/day: 1.00    Years: 25.00    Pack years: 25.00    Types: Cigarettes    Last attempt to quit: 01/05/2008    Years since quitting: 10.0  . Smokeless tobacco: Never Used  Substance Use Topics  . Alcohol use: No    Alcohol/week: 0.0 oz  . Drug use: No     Allergies   Sulfa drugs cross reactors   Review of  Systems Review of Systems  Constitutional: Negative for fever.  Eyes: Negative for photophobia.  Respiratory: Negative for cough, chest tightness and shortness of breath.   Cardiovascular: Negative for chest pain, palpitations and leg swelling.  Gastrointestinal: Positive for diarrhea and vomiting. Negative for anal bleeding and blood in stool.  Musculoskeletal: Positive for myalgias. Negative for neck pain and neck stiffness.  All other systems reviewed and are negative.    Physical Exam Updated Vital Signs BP 139/87 (BP Location: Left Arm)   Pulse (!) 115   Temp 99.5 F (37.5 C) (Oral)   Resp 18   SpO2 94%   Physical Exam  Constitutional: She is oriented to person, place, and time. She appears well-developed and well-nourished. No distress.  HENT:  Head: Normocephalic and atraumatic.  Mouth/Throat: No oropharyngeal exudate.  Eyes: Conjunctivae are normal. Pupils are equal, round, and reactive to light.  Neck: Normal range of motion. Neck supple.  Cardiovascular: Normal rate, regular rhythm, normal heart sounds and intact distal pulses.  Pulmonary/Chest: Effort normal and breath sounds normal. No stridor. No respiratory distress. She has no wheezes. She has no rales.  Abdominal: Soft. Bowel sounds are normal. She exhibits no distension and no mass. There is no tenderness. There is no rigidity, no rebound, no guarding, no tenderness at McBurney's point and negative Murphy's sign. No hernia.  Musculoskeletal: Normal range of motion.  Neurological: She is alert and oriented to person, place, and time. She displays normal reflexes.  Skin: Skin is warm and dry. Capillary refill takes less than 2 seconds. She is not diaphoretic.  Nursing note and vitals reviewed.    ED Treatments / Results  Labs (all labs ordered are listed, but only abnormal results are displayed) Results for orders placed or performed during the hospital encounter of 01/24/18  Lipase, blood  Result Value Ref  Range   Lipase 21 11 - 51 U/L  Comprehensive metabolic panel  Result Value Ref Range   Sodium 136 135 - 145 mmol/L   Potassium 3.2 (L) 3.5 - 5.1 mmol/L   Chloride 104 101 - 111 mmol/L   CO2 24 22 - 32 mmol/L   Glucose, Bld 99 65 - 99 mg/dL   BUN 6 6 - 20 mg/dL   Creatinine, Ser 9.56 0.44 - 1.00 mg/dL   Calcium 8.6 (L) 8.9 - 10.3 mg/dL   Total Protein 6.7 6.5 - 8.1 g/dL   Albumin 3.4 (L) 3.5 - 5.0 g/dL   AST 20 15 - 41 U/L   ALT 12 (L) 14 - 54 U/L   Alkaline Phosphatase 45 38 - 126 U/L   Total Bilirubin 0.7 0.3 - 1.2 mg/dL   GFR calc non Af Amer >60 >60 mL/min   GFR calc Af Amer >60 >60 mL/min   Anion gap 8 5 - 15  CBC  Result Value  Ref Range   WBC 7.2 4.0 - 10.5 K/uL   RBC 4.35 3.87 - 5.11 MIL/uL   Hemoglobin 11.8 (L) 12.0 - 15.0 g/dL   HCT 54.036.3 98.136.0 - 19.146.0 %   MCV 83.4 78.0 - 100.0 fL   MCH 27.1 26.0 - 34.0 pg   MCHC 32.5 30.0 - 36.0 g/dL   RDW 47.814.6 29.511.5 - 62.115.5 %   Platelets 289 150 - 400 K/uL  Urinalysis, Routine w reflex microscopic  Result Value Ref Range   Color, Urine YELLOW YELLOW   APPearance HAZY (A) CLEAR   Specific Gravity, Urine 1.023 1.005 - 1.030   pH 6.0 5.0 - 8.0   Glucose, UA NEGATIVE NEGATIVE mg/dL   Hgb urine dipstick MODERATE (A) NEGATIVE   Bilirubin Urine NEGATIVE NEGATIVE   Ketones, ur 20 (A) NEGATIVE mg/dL   Protein, ur NEGATIVE NEGATIVE mg/dL   Nitrite NEGATIVE NEGATIVE   Leukocytes, UA NEGATIVE NEGATIVE   RBC / HPF 6-30 0 - 5 RBC/hpf   WBC, UA 6-30 0 - 5 WBC/hpf   Bacteria, UA NONE SEEN NONE SEEN   Squamous Epithelial / LPF 6-30 (A) NONE SEEN   Mucus PRESENT   I-Stat beta hCG blood, ED  Result Value Ref Range   I-stat hCG, quantitative <5.0 <5 mIU/mL   Comment 3          I-Stat CG4 Lactic Acid, ED  Result Value Ref Range   Lactic Acid, Venous 1.43 0.5 - 1.9 mmol/L   Dg Chest 2 View  Result Date: 01/23/2018 CLINICAL DATA:  Abdomen pain as well as nausea vomiting since this morning EXAM: CHEST  2 VIEW COMPARISON:  October 02, 2017  FINDINGS: The heart size and mediastinal contours are within normal limits. There is mild patchy opacity of left lung base. There is no pulmonary edema or pleural effusion. The visualized skeletal structures are unremarkable. IMPRESSION: Mild patchy opacity of the left lung base, favor atelectasis. Electronically Signed   By: Sherian ReinWei-Chen  Lin M.D.   On: 01/23/2018 18:13   Ct Abdomen Pelvis W Contrast  Result Date: 01/24/2018 CLINICAL DATA:  Acute onset of generalized abdominal pain, nausea and vomiting. EXAM: CT ABDOMEN AND PELVIS WITH CONTRAST TECHNIQUE: Multidetector CT imaging of the abdomen and pelvis was performed using the standard protocol following bolus administration of intravenous contrast. CONTRAST:  100 mL of Isovue 300 IV contrast COMPARISON:  CT of the abdomen and pelvis from 03/30/2011 FINDINGS: Lower chest: The visualized lung bases are grossly clear. The visualized portions of the mediastinum are unremarkable. Hepatobiliary: The liver is unremarkable in appearance. The gallbladder is unremarkable in appearance. The common bile duct remains normal in caliber. Pancreas: The pancreas is within normal limits. Spleen: The spleen is unremarkable in appearance. Adrenals/Urinary Tract: The adrenal glands are unremarkable in appearance. The kidneys are within normal limits. There is no evidence of hydronephrosis. No renal or ureteral stones are identified. No perinephric stranding is seen. Stomach/Bowel: The stomach is unremarkable in appearance. The small bowel is within normal limits. The appendix is normal in caliber, without evidence of appendicitis. Scattered diverticulosis is noted along the distal descending and proximal sigmoid colon, without evidence of diverticulitis. Vascular/Lymphatic: Scattered calcification is seen along the abdominal aorta and its branches. The abdominal aorta is otherwise grossly unremarkable. The inferior vena cava is grossly unremarkable. No retroperitoneal lymphadenopathy  is seen. No pelvic sidewall lymphadenopathy is identified. Reproductive: The bladder is decompressed and not well assessed. The uterus is unremarkable in appearance. The ovaries are  relatively symmetric. No suspicious adnexal masses are seen. Other: No additional soft tissue abnormalities are seen. Musculoskeletal: No acute osseous abnormalities are identified. Mild vacuum phenomenon is noted along the mid lumbar spine. The visualized musculature is unremarkable in appearance. IMPRESSION: 1. No acute abnormality seen within the abdomen or pelvis. 2. Scattered diverticulosis along the distal descending and proximal sigmoid colon, without evidence of diverticulitis. Aortic Atherosclerosis (ICD10-I70.0). Electronically Signed   By: Roanna Raider M.D.   On: 01/24/2018 01:45    Radiology Dg Chest 2 View  Result Date: 01/23/2018 CLINICAL DATA:  Abdomen pain as well as nausea vomiting since this morning EXAM: CHEST  2 VIEW COMPARISON:  October 02, 2017 FINDINGS: The heart size and mediastinal contours are within normal limits. There is mild patchy opacity of left lung base. There is no pulmonary edema or pleural effusion. The visualized skeletal structures are unremarkable. IMPRESSION: Mild patchy opacity of the left lung base, favor atelectasis. Electronically Signed   By: Sherian Rein M.D.   On: 01/23/2018 18:13   Ct Abdomen Pelvis W Contrast  Result Date: 01/24/2018 CLINICAL DATA:  Acute onset of generalized abdominal pain, nausea and vomiting. EXAM: CT ABDOMEN AND PELVIS WITH CONTRAST TECHNIQUE: Multidetector CT imaging of the abdomen and pelvis was performed using the standard protocol following bolus administration of intravenous contrast. CONTRAST:  100 mL of Isovue 300 IV contrast COMPARISON:  CT of the abdomen and pelvis from 03/30/2011 FINDINGS: Lower chest: The visualized lung bases are grossly clear. The visualized portions of the mediastinum are unremarkable. Hepatobiliary: The liver is unremarkable  in appearance. The gallbladder is unremarkable in appearance. The common bile duct remains normal in caliber. Pancreas: The pancreas is within normal limits. Spleen: The spleen is unremarkable in appearance. Adrenals/Urinary Tract: The adrenal glands are unremarkable in appearance. The kidneys are within normal limits. There is no evidence of hydronephrosis. No renal or ureteral stones are identified. No perinephric stranding is seen. Stomach/Bowel: The stomach is unremarkable in appearance. The small bowel is within normal limits. The appendix is normal in caliber, without evidence of appendicitis. Scattered diverticulosis is noted along the distal descending and proximal sigmoid colon, without evidence of diverticulitis. Vascular/Lymphatic: Scattered calcification is seen along the abdominal aorta and its branches. The abdominal aorta is otherwise grossly unremarkable. The inferior vena cava is grossly unremarkable. No retroperitoneal lymphadenopathy is seen. No pelvic sidewall lymphadenopathy is identified. Reproductive: The bladder is decompressed and not well assessed. The uterus is unremarkable in appearance. The ovaries are relatively symmetric. No suspicious adnexal masses are seen. Other: No additional soft tissue abnormalities are seen. Musculoskeletal: No acute osseous abnormalities are identified. Mild vacuum phenomenon is noted along the mid lumbar spine. The visualized musculature is unremarkable in appearance. IMPRESSION: 1. No acute abnormality seen within the abdomen or pelvis. 2. Scattered diverticulosis along the distal descending and proximal sigmoid colon, without evidence of diverticulitis. Aortic Atherosclerosis (ICD10-I70.0). Electronically Signed   By: Roanna Raider M.D.   On: 01/24/2018 01:45    Procedures Procedures (including critical care time)  Medications Ordered in ED Medications  sodium chloride 0.9 % injection (not administered)  ketorolac (TORADOL) 30 MG/ML injection 15  mg (not administered)  sodium chloride 0.9 % bolus 1,000 mL (1,000 mLs Intravenous New Bag/Given 01/24/18 0106)  ondansetron (ZOFRAN) injection 4 mg (4 mg Intravenous Given 01/24/18 0112)  dicyclomine (BENTYL) injection 20 mg (20 mg Intramuscular Given 01/24/18 0112)  iopamidol (ISOVUE-300) 61 % injection 100 mL (100 mLs Intravenous Contrast Given 01/24/18 0127)  Final Clinical Impressions(s) / ED Diagnoses  CT of the abdomen is negative, abdomen is benign and history is consistent with viral n/v/d.  Well appearing, hydrated and PO challenged in the ED.  Will d/c with zofran.    Return for weakness, numbness, changes in vision or speech,  fevers > 100.4 unrelieved by medication, shortness of breath, intractable vomiting, or diarrhea, abdominal pain, Inability to tolerate liquids or food, cough, altered mental status or any concerns. No signs of systemic illness or infection. The patient is nontoxic-appearing on exam and vital signs are within normal limits.    I have reviewed the triage vital signs and the nursing notes. Pertinent labs &imaging results that were available during my care of the patient were reviewed by me and considered in my medical decision making (see chart for details).  After history, exam, and medical workup I feel the patient has been appropriately medically screened and is safe for discharge home. Pertinent diagnoses were discussed with the patient. Patient was given return precautions.    Geniece Akers, MD 01/24/18 1610

## 2018-02-12 ENCOUNTER — Encounter: Payer: Self-pay | Admitting: Internal Medicine

## 2018-04-15 ENCOUNTER — Other Ambulatory Visit: Payer: Self-pay | Admitting: Internal Medicine

## 2018-04-15 DIAGNOSIS — I10 Essential (primary) hypertension: Secondary | ICD-10-CM

## 2018-04-15 MED ORDER — LISINOPRIL-HYDROCHLOROTHIAZIDE 20-12.5 MG PO TABS: 2.0000 | ORAL_TABLET | Freq: Every day | ORAL | 11 refills | Status: DC

## 2018-04-15 MED ORDER — NORGESTIMATE-ETH ESTRADIOL 0.25-35 MG-MCG PO TABS: 1.0000 | ORAL_TABLET | Freq: Every day | ORAL | 11 refills | Status: DC

## 2018-04-15 NOTE — Telephone Encounter (Signed)
Patient is requesting refills on blood pressure and birth control

## 2018-05-20 ENCOUNTER — Encounter: Payer: Self-pay | Admitting: Internal Medicine

## 2018-05-20 ENCOUNTER — Ambulatory Visit: Payer: Self-pay | Admitting: Internal Medicine

## 2018-05-20 ENCOUNTER — Other Ambulatory Visit (HOSPITAL_COMMUNITY)
Admission: RE | Admit: 2018-05-20 | Discharge: 2018-05-20 | Disposition: A | Discharge status: Home / Self Care | Payer: Self-pay | Source: Ambulatory Visit | Attending: Student in an Organized Health Care Education/Training Program | Admitting: Student in an Organized Health Care Education/Training Program

## 2018-05-20 ENCOUNTER — Other Ambulatory Visit: Payer: Self-pay

## 2018-05-20 VITALS — BP 152/89 | HR 104 | Temp 98.0°F | Ht 63.0 in | Wt 223.4 lb

## 2018-05-20 DIAGNOSIS — M25559 Pain in unspecified hip: Secondary | ICD-10-CM

## 2018-05-20 DIAGNOSIS — I10 Essential (primary) hypertension: Secondary | ICD-10-CM

## 2018-05-20 DIAGNOSIS — K219 Gastro-esophageal reflux disease without esophagitis: Secondary | ICD-10-CM

## 2018-05-20 DIAGNOSIS — N921 Excessive and frequent menstruation with irregular cycle: Secondary | ICD-10-CM | POA: Insufficient documentation

## 2018-05-20 DIAGNOSIS — Z79899 Other long term (current) drug therapy: Secondary | ICD-10-CM

## 2018-05-20 DIAGNOSIS — K559 Vascular disorder of intestine, unspecified: Secondary | ICD-10-CM

## 2018-05-20 DIAGNOSIS — M25552 Pain in left hip: Secondary | ICD-10-CM

## 2018-05-20 MED ORDER — AMLODIPINE BESYLATE 5 MG PO TABS: 5.0000 mg | ORAL_TABLET | Freq: Every day | ORAL | 11 refills | Status: DC

## 2018-05-20 NOTE — Progress Notes (Addendum)
CC: " I need another hip injection"  HPI:  [name] is a 52 y.o. with PMH GERD, hypertension, menorrhagia, ischemic colitis, and greater trochanteric pain syndrome who presents for follow up of menorrhagia, hypertension, and greater trochanteric pain syndrome. Please see the assessment and plans for the status of the patient chronic medical problems.   Review of Systems:  Refer to history of present illness and assessment and plans for pertinent review of systems, all others reviewed and negative  Physical Exam:  Vitals:   05/20/18 1429  BP: (!) 152/89  Pulse: (!) 104  Temp: 98 F (36.7 C)  TempSrc: Oral  SpO2: 100%  Weight: 223 lb 6.4 oz (101.3 kg)  Height: 5\' 3"  (1.6 m)   General: well appearing, no acute distress Cardiac: regular rate and rhythm, normal S1 and S2, no murmur appreciated, no peripheral edema  Pulm: normal work of breathing, lungs clear to auscultation  GI: BS+, abdomen soft, non tender, non distended  MSK: normal gait without trendelenburg, there is tenderness to palpation over the left greater trochanter, the right greater trochanter is not tender to palpation, no tenderness to palpation over the IT band or gluteals   Assessment & Plan:   Hypertension  Blood pressure is elevated today. She has been taking lisinopril - HCTZ 40-25 mg daily.  - continue lisinopril - HCTZ 40-25 mg daily  - start amlodipine 5 mg daily   Metrorrhagia   Describes a history of menorrhagia which began in 2013 and was initially treated well controlled with sprintec up until the last few months. For the past two months she has had consistent daily light vaginal bleeding. She uses one pad per day. She no longer has periods of heavy bleeding. She also notes night sweats, decreased libido, and dyspareunia. She has concerning symptoms. She is due for a pap smear today, last pap was in 2013 and had no changes concerning for malignancy.  - continue daily estrogen- progesterone OCP     - pap smear with co testing for HPV today  - if pap is negative, could consider trial of topical estrogen for dysparenunia   Greater trochanteric pain syndrome  Described lateral hip pain at last office visit and was found to have tenderness to palpation over the greater trochanter. NSAID use had been associated with GI discomfort. She underwent corticosteroid injection into the greater trochanter and experienced relief of the hip pain for about four months before her pain returned. She has gone to using OTC creams such as asparcream and experienced minimal relief of her pain.  - performed injection of the left greater trochanter today   GERD  Taking ranitidine on a daily bases. She notices nausea on occasion and has reoccurrence of the heartburn when she misses a dose of ranitidine. Her persistent GERD symptoms are concerning for PUD and she would benefit from GI eval for possible EGD when she can afford the cost or has established insurance.  - encouraged establishing insurance. - continue ranitidine   Trochanteric Bursa Injection Note  Pre-operative Diagnosis: left greater trochanteric pain syndrome   Indications: pain relief   Anesthesia: not required   Procedure Details   Consent was obtained for the procedure. The lateral hip was prepped with iodine. Using a 21 gauge needle the trochanteric bursa was injected with 2 mL 1% lidocaine and 1 mL of triamcinolone (KENALOG) 40mg /ml. The needle was removed and a dressing was applied.  Complications:  None; patient tolerated the procedure well.  See  Encounters Tab for problem based charting.  Patient seen with Dr. Oswaldo DoneVincent

## 2018-05-20 NOTE — Patient Instructions (Addendum)
Thank you for coming to the clinic today. It was a pleasure to see you.   For your high blood pressure  - be aware of the foods that you are eating, try to stay away from chips and frozen foods, and drive through which contain a lot of salt  - continue taking the lisinopril - HCTZ  - start taking amlodipine 5 mg daily   I will call you with the results of your pap smear Please feel free to speak with the social worker in our office, you should take advantage of [facility]    FOLLOW-UP INSTRUCTIONS When: 6 months - 1 year with Dr. Nechuma Boven   For: FolloObie Dredgew up of your blood pressure  What to bring: all of your medication bottles   Please call our clinic if you have any questions or concerns, we may be able to help and keep you from a long and expensive emergency room wait. Our clinic and after hours phone number is [phone], there is always someone available. If you need medication refills please notify your pharmacy one week in advance and they will send us a request.

## 2018-05-21 NOTE — Progress Notes (Signed)
Internal Medicine Clinic Attending  I saw and evaluated the patient.  I personally confirmed the key portions of the history and exam documented by Dr. Blum and I reviewed pertinent patient test results.  The assessment, diagnosis, and plan were formulated together and I agree with the documentation in the resident's note. I was present for the entirety of the procedure.  

## 2018-05-23 DIAGNOSIS — M25559 Pain in unspecified hip: Secondary | ICD-10-CM | POA: Insufficient documentation

## 2018-05-23 DIAGNOSIS — M7062 Trochanteric bursitis, left hip: Secondary | ICD-10-CM | POA: Insufficient documentation

## 2018-05-23 LAB — CYTOLOGY - PAP
DIAGNOSIS: NEGATIVE
HPV (WINDOPATH): NOT DETECTED

## 2018-05-23 NOTE — Assessment & Plan Note (Signed)
Taking ranitidine on a daily bases. She notices nausea on occasion and has reoccurrence of the heartburn when she misses a dose of ranitidine. Her persistent GERD symptoms are concerning for PUD and she would benefit from GI eval for possible EGD when she can afford the cost or has established insurance.  - encouraged establishing insurance. - continue ranitidine

## 2018-05-23 NOTE — Assessment & Plan Note (Signed)
Described lateral hip pain at last office visit and was found to have tenderness to palpation over the greater trochanter. NSAID use had been associated with GI discomfort. She underwent corticosteroid injection into the greater trochanter and experienced relief of the hip pain for about four months before her pain returned. She has gone to using OTC creams such as asparcream and experienced minimal relief of her pain.  - performed injection of the left greater trochanter today

## 2018-05-23 NOTE — Assessment & Plan Note (Signed)
Describes a history of menorrhagia which began in 2013 and was initially treated well controlled with sprintec up until the last few months. For the past two months she has had consistent daily light vaginal bleeding. She uses one pad per day. She no longer has periods of heavy bleeding. She also notes night sweats, decreased libido, and dyspareunia. She has concerning symptoms. She is due for a pap smear today, last pap was in 2013 and had no changes concerning for malignancy.  - continue daily estrogen- progesterone OCP   - pap smear with co testing for HPV today  - if pap is negative, could consider trial of topical estrogen for dysparenunia

## 2018-05-23 NOTE — Assessment & Plan Note (Signed)
Blood pressure is elevated today. She has been taking lisinopril - HCTZ 40-25 mg daily.  - continue lisinopril - HCTZ 40-25 mg daily  - start amlodipine 5 mg daily

## 2018-06-10 ENCOUNTER — Encounter: Payer: Self-pay | Admitting: Internal Medicine

## 2018-11-16 IMAGING — CT CT ABD-PELV W/ CM
2 of 5 series · 16 of 46 positions shown, 18 images · IV contrast (ISOVUE)
Comparison: CT of the abdomen and pelvis from 03/30/2011

CLINICAL DATA: Acute onset of generalized abdominal pain, nausea
and vomiting.

EXAM:
CT ABDOMEN AND PELVIS WITH CONTRAST
TECHNIQUE: Multidetector CT imaging of the abdomen and pelvis was performed
using the standard protocol following bolus administration of
intravenous contrast.
CONTRAST:  100 mL of Isovue 300 IV contrast

[Series 2: axial st · axial · 0.78mm/px · z∈[+1051,+1416]mm · 13 of 85 slices shown, 15 images]
[im 6/85  soft-tissue]
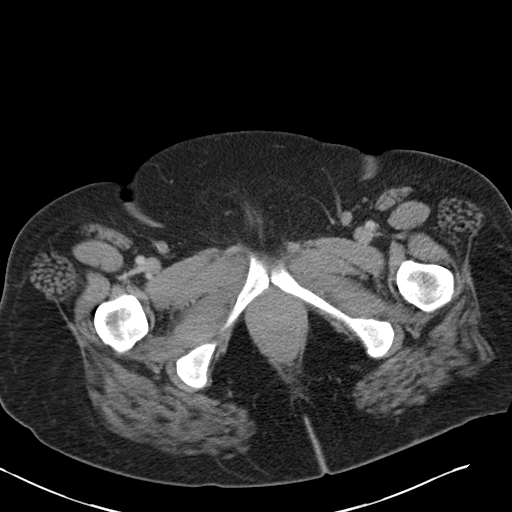
[im 6/85  bone]
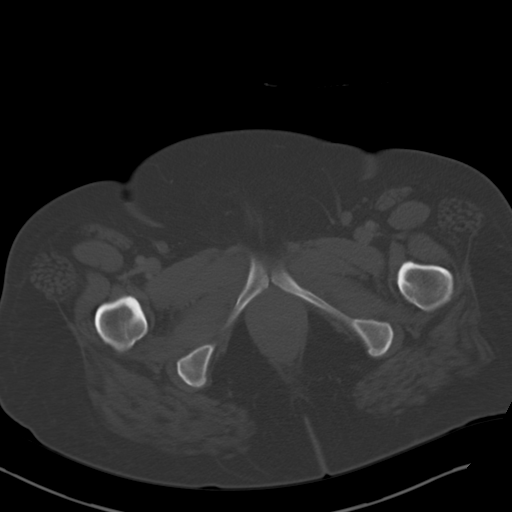
[im 11/85  soft-tissue]
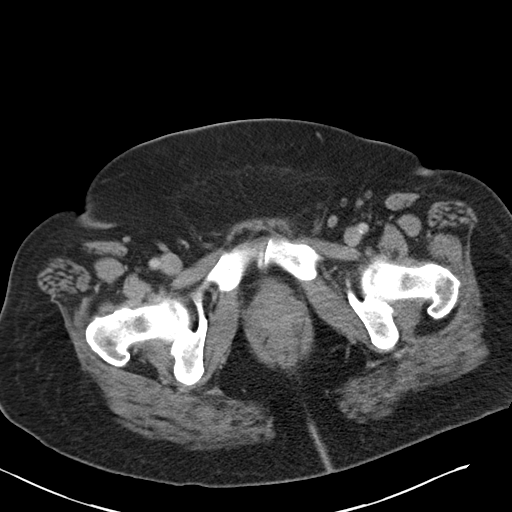
[im 16/85  soft-tissue]
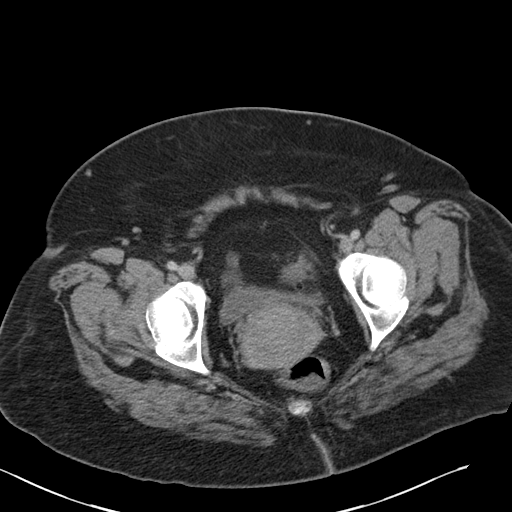
[im 27/85  soft-tissue]
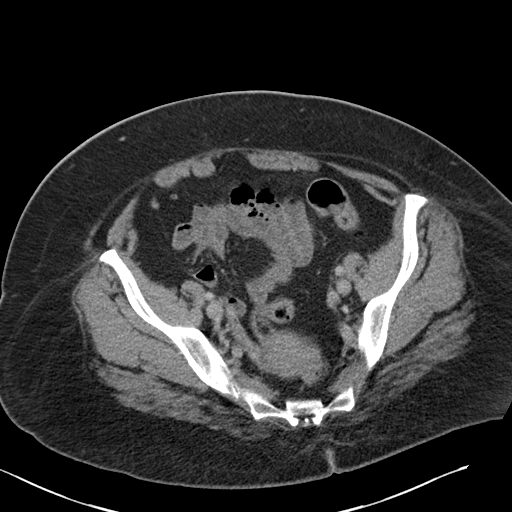
[im 32/85  soft-tissue]
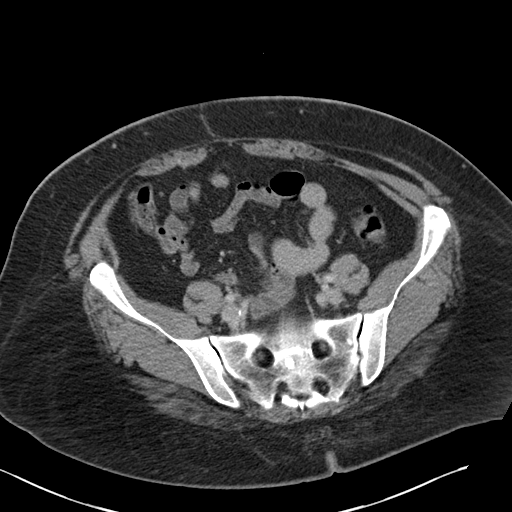
[im 37/85  soft-tissue]
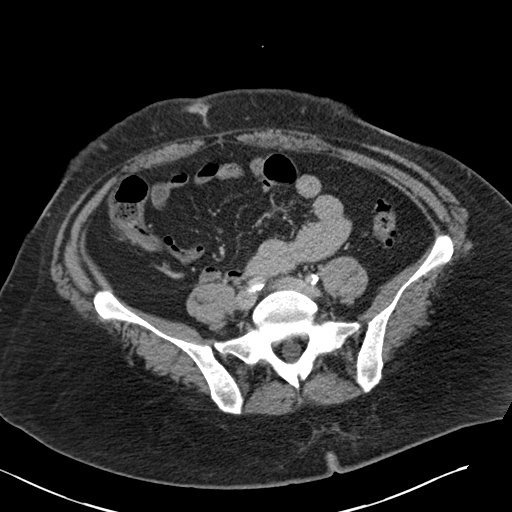
[im 43/85  soft-tissue]
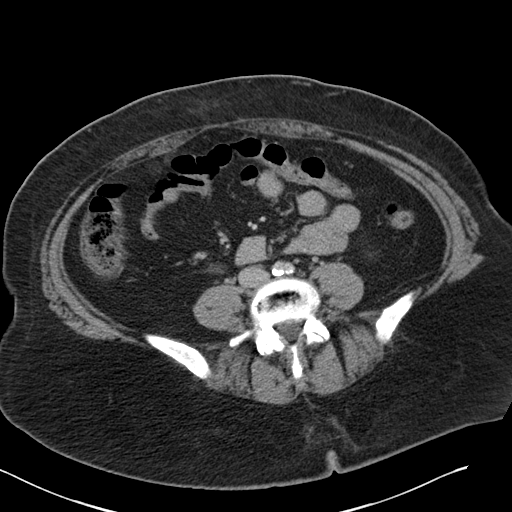
[im 48/85  soft-tissue]
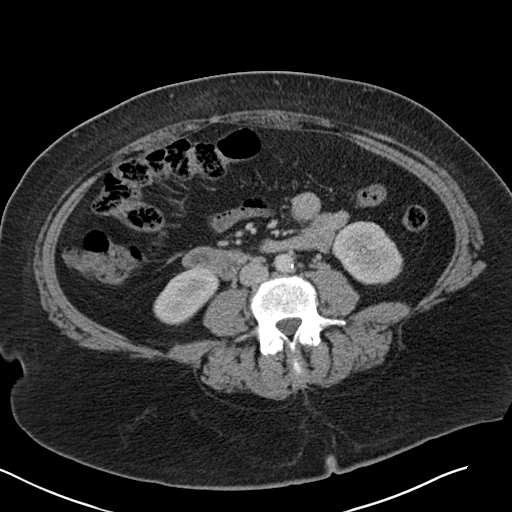
[im 53/85  soft-tissue]
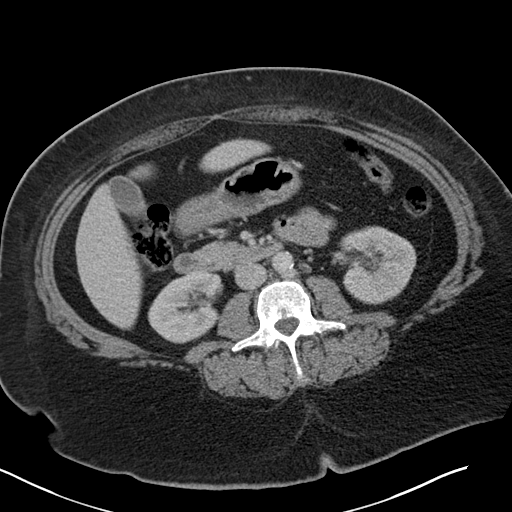
[im 53/85  bone]
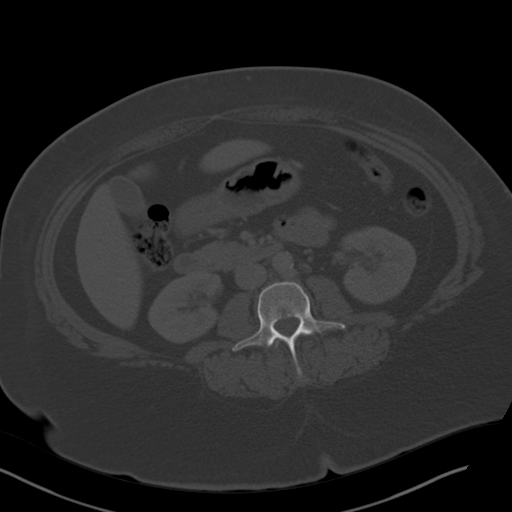
[im 58/85  soft-tissue]
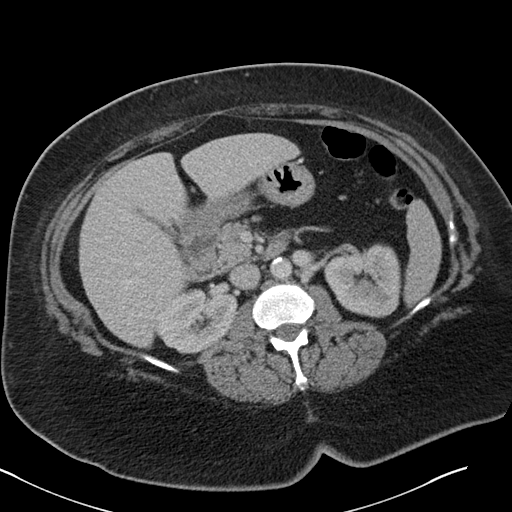
[im 69/85  soft-tissue]
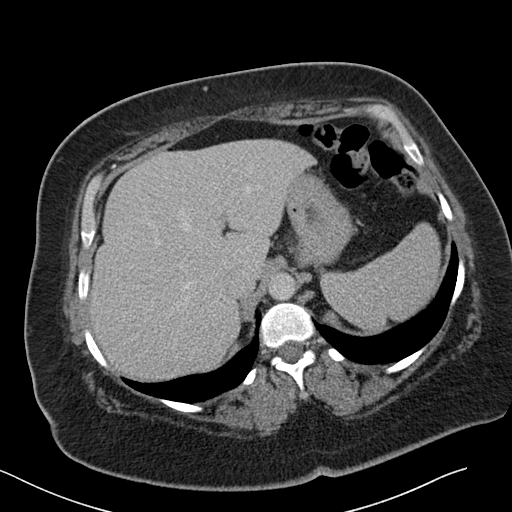
[im 74/85  soft-tissue]
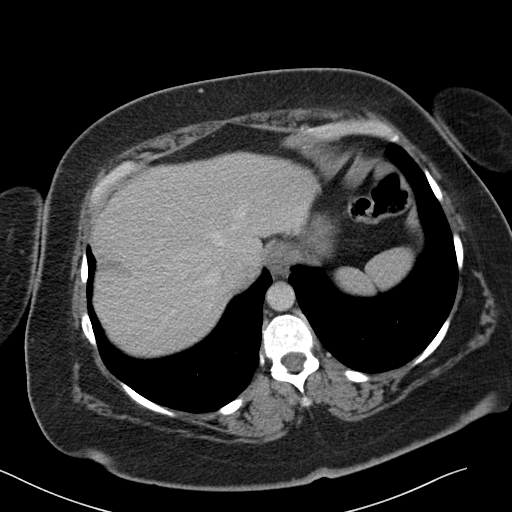
[im 79/85  soft-tissue]
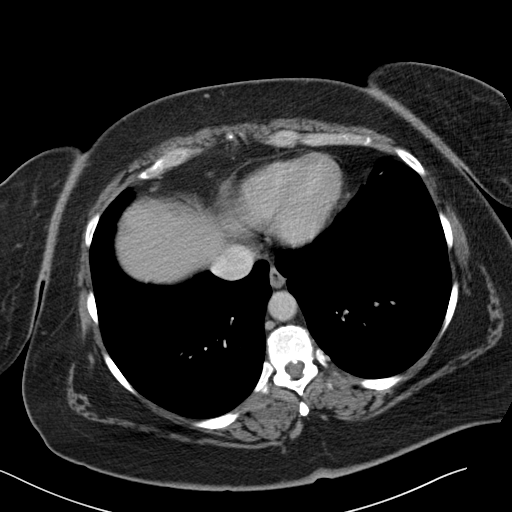

[Series 5: coronal st · coronal · 0.74mm/px · 3 of 95 slices shown]
[im 32/95  soft-tissue]
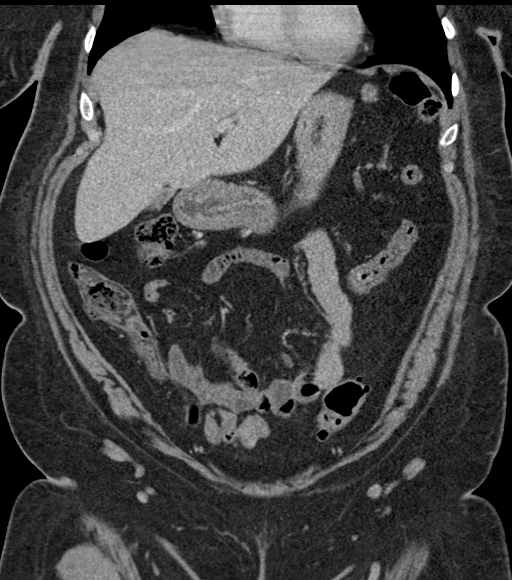
[im 42/95  soft-tissue]
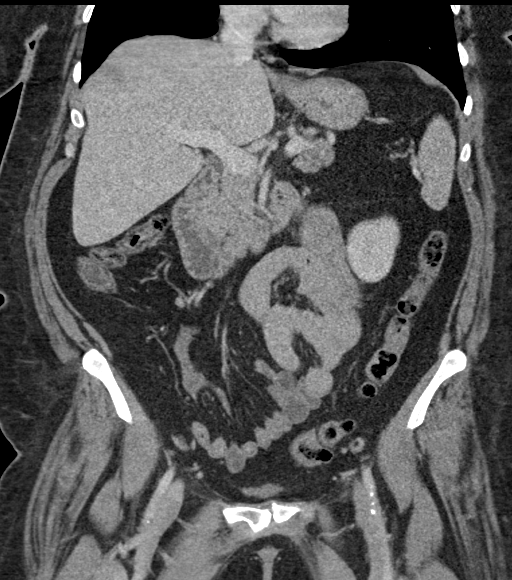
[im 53/95  soft-tissue]
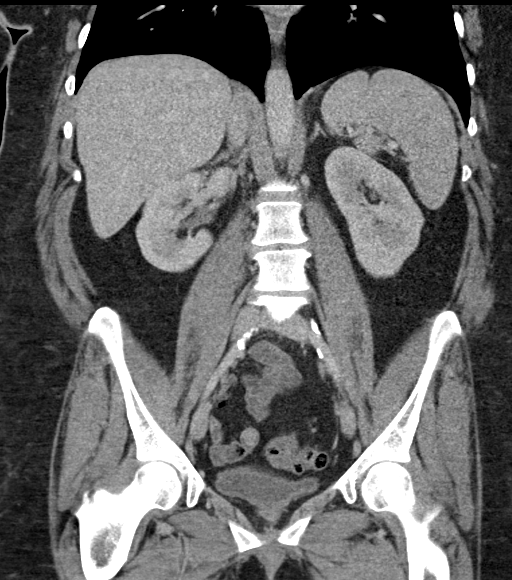

[16 of 46 positions shown; findings below may reference images not displayed]

FINDINGS: Lower chest: The visualized lung bases are grossly clear. The
visualized portions of the mediastinum are unremarkable.

Hepatobiliary: The liver is unremarkable in appearance. The
gallbladder is unremarkable in appearance. The common bile duct
remains normal in caliber.

Pancreas: The pancreas is within normal limits.

Spleen: The spleen is unremarkable in appearance.

Adrenals/Urinary Tract: The adrenal glands are unremarkable in
appearance. The kidneys are within normal limits. There is no
evidence of hydronephrosis. No renal or ureteral stones are
identified. No perinephric stranding is seen.

Stomach/Bowel: The stomach is unremarkable in appearance. The small
bowel is within normal limits. The appendix is normal in caliber,
without evidence of appendicitis.

Scattered diverticulosis is noted along the distal descending and
proximal sigmoid colon, without evidence of diverticulitis.

Vascular/Lymphatic: Scattered calcification is seen along the
abdominal aorta and its branches. The abdominal aorta is otherwise
grossly unremarkable. The inferior vena cava is grossly
unremarkable. No retroperitoneal lymphadenopathy is seen. No pelvic
sidewall lymphadenopathy is identified.

Reproductive: The bladder is decompressed and not well assessed. The
uterus is unremarkable in appearance. The ovaries are relatively
symmetric. No suspicious adnexal masses are seen.

Other: No additional soft tissue abnormalities are seen.

Musculoskeletal: No acute osseous abnormalities are identified. Mild
vacuum phenomenon is noted along the mid lumbar spine. The
visualized musculature is unremarkable in appearance.
IMPRESSION: 1. No acute abnormality seen within the abdomen or pelvis.
2. Scattered diverticulosis along the distal descending and proximal
sigmoid colon, without evidence of diverticulitis.

Aortic Atherosclerosis ([id number].E).

## 2018-11-20 ENCOUNTER — Encounter: Payer: Self-pay | Admitting: Internal Medicine

## 2018-11-27 ENCOUNTER — Encounter: Payer: Self-pay | Admitting: Internal Medicine

## 2018-12-02 ENCOUNTER — Encounter: Payer: Self-pay | Admitting: Internal Medicine

## 2018-12-02 ENCOUNTER — Other Ambulatory Visit: Payer: Self-pay

## 2018-12-02 ENCOUNTER — Ambulatory Visit: Payer: Self-pay | Admitting: Internal Medicine

## 2018-12-02 VITALS — BP 126/70 | HR 102 | Temp 98.5°F | Ht 63.0 in | Wt 233.8 lb

## 2018-12-02 DIAGNOSIS — N920 Excessive and frequent menstruation with regular cycle: Secondary | ICD-10-CM

## 2018-12-02 DIAGNOSIS — Z87891 Personal history of nicotine dependence: Secondary | ICD-10-CM

## 2018-12-02 DIAGNOSIS — R0609 Other forms of dyspnea: Secondary | ICD-10-CM

## 2018-12-02 DIAGNOSIS — M25559 Pain in unspecified hip: Secondary | ICD-10-CM

## 2018-12-02 DIAGNOSIS — R0600 Dyspnea, unspecified: Secondary | ICD-10-CM

## 2018-12-02 DIAGNOSIS — I10 Essential (primary) hypertension: Secondary | ICD-10-CM

## 2018-12-02 DIAGNOSIS — Z23 Encounter for immunization: Secondary | ICD-10-CM

## 2018-12-02 DIAGNOSIS — Z79899 Other long term (current) drug therapy: Secondary | ICD-10-CM

## 2018-12-02 DIAGNOSIS — E785 Hyperlipidemia, unspecified: Secondary | ICD-10-CM

## 2018-12-02 DIAGNOSIS — Z6841 Body Mass Index (BMI) 40.0 and over, adult: Secondary | ICD-10-CM

## 2018-12-02 DIAGNOSIS — M25552 Pain in left hip: Secondary | ICD-10-CM

## 2018-12-02 DIAGNOSIS — K219 Gastro-esophageal reflux disease without esophagitis: Secondary | ICD-10-CM

## 2018-12-02 DIAGNOSIS — D649 Anemia, unspecified: Secondary | ICD-10-CM

## 2018-12-02 DIAGNOSIS — E7800 Pure hypercholesterolemia, unspecified: Secondary | ICD-10-CM

## 2018-12-02 DIAGNOSIS — K559 Vascular disorder of intestine, unspecified: Secondary | ICD-10-CM

## 2018-12-02 NOTE — Patient Instructions (Addendum)
Thank you for coming to the clinic today. It was a pleasure to see you.   For your high blood pressure and hip pain, try to get a gym membership and start doing some water exercises  For your cholesterol Start taking simvastatin 20 mg daily, the most common side effect of this is muscle aches stop taking the medication and call us if you have this problem   FOLLOW-UP INSTRUCTIONS When: 1 year  For: follow up of your hypertension  What to bring: all of your medication bottles   Please call the internal medicine center clinic if you have any questions or concerns, we may be able to help and keep you from a long and expensive emergency room wait. Our clinic and after hours phone number is [phone], the best time to call is Monday through Friday 9 am to 4 pm but there is always someone available 24/7 if you have an emergency. If you need medication refills please notify your pharmacy one week in advance and they will send us a request.

## 2018-12-02 NOTE — Progress Notes (Signed)
CC: follow up of hypertension and left hip pain   HPI:  [name] is a 52 y.o. with PMH COPD, Menorrhagia, HTN, Asthma, HLD, Obesity, GERD, ischemic colitis who presents for follow up of hypertension. Please see the assessment and plans for the status of the patient chronic medical problems.   Past Medical History:  Diagnosis Date  . GERD (gastroesophageal reflux disease)   . Hypertension    Review of Systems: Refer to history of present illness and assessment and plans for pertinent review of systems, all others reviewed and negative  Physical Exam:  Vitals:   12/02/18 1424  BP: 126/70  Pulse: (!) 102  Temp: 98.5 F (36.9 C)  TempSrc: Oral  SpO2: 99%  Weight: 233 lb 12.8 oz (106.1 kg)  Height: 5\' 3"  (1.6 m)   General: well appearing  Cardiac: regular rate and rhythm, no murmur, no peripheral edema  Pulm: lungs clear to auscultation  GI: the abdomen is soft, non tender, non distended  MSK: tenderness with palpation over the left greater trochanter, no tenderness with hip flexion or fabers testing   Assessment & Plan:   Hypertension  BP Readings from Last 3 Encounters:  12/02/18 126/70  05/20/18 (!) 152/89  01/24/18 124/78  Blood pressure is well controlled and below goal today. BMP when last checked about one year ago showed normal creatinine with potassium of 3.2. Since the time of last office visit she has stopped taking the amlodipine.  - discontinue Amlodipine 5 mg daily - continue lisinopril- HCTZ 40 - 25 mg daily  - follow up BMP today, renal function remains good, potassium improved to 3.4  Greater trochanteric pain syndrome  Patient is here for follow-up of lateral hip pain.  She experience a months of relief after injection at the greater trochanter in June 2019.  She returns today requesting repeat corticosteroid injection.  Again she has tenderness over the greater trochanter and gluteus medius.  I think this pain may be related to a gait or stance  abnormality and would like her to go to physical therapy when she has the coverage to make this affordable.  Today a corticosteroid injection to the left greater trochanter was performed.  GERD  Heartburn and nausea controlled on nexium twice daily.  - continue nexium twice daily   Normocytic anemia with normal RDW  Last CBC showed a hemoglobin of 11.8, no prior iron studies found in this medical record.  CBC today shows a hemoglobin of 13.7, ferritin is 13.  This is consistent with iron deficiency. No iron supplementation for now, will continue to monitor hemoglobin on a regular basis.  Morbid obesity  BMI 41 with multiple comorbid conditions including hypertension, GERD. Tough to exercise around her home because she is not in a safe neighborhood. Has been able to cut back on sodas, switched to water. Has been able to cut back on snack foods. She is interested in aquatic exercise, and will check out [employer]      Dyspnea on exertion   Never had pulmonary function test in the past. Around age 75 she was told that she had COPD during hospital discharge for pneumonia. She gets short of breath with exertion which relieves with rest, sometimes she tries using albuterol but even when she does not have it the difficulty breathing resolves quickly with rest. She has no associated coughing or wheezing. She does not have nighttime symptoms. Symptoms are worse during allergy season or when she gets a cold  or when it is hot outside. She has not used the albuterol inhaler for the past month. Hx of tobacco use, 30 years of smoking 1 PPD quit about 10 years ago. Breathing has overall improved since she stopped smoking.  - I do not see an indication for albuterol anymore, she may benefit from sinus congestion prophylaxis from an over the counter intranasal corticosteroid instead  - she would like PFT testing when she has the insurance to cover it  Hyperlipidemia  Requesting tx today. Father had first MI  at 5744, brother had first MI at age 10350 followed by 4 additional heart attacks, mother had strokes at age 142. Her last LDL was 150.  - start simvastatin 10 mg daily   - lipid panel at follow up     Uninsured status  Family making income above the cutoff for assistance but obtaining insurance through her husbands company will cost $500.  She has been speaking with an insurance company that can offer her coverage today reduce rate.  Preventative health  - flu vaccine provided today   See Encounters Tab for problem based charting.  Patient seen with Dr. Cleda DaubE. Hoffman

## 2018-12-03 LAB — CBC WITH DIFFERENTIAL/PLATELET
BASOS ABS: 0.1 10*3/uL (ref 0.0–0.2)
Basos: 1 %
EOS (ABSOLUTE): 0.2 10*3/uL (ref 0.0–0.4)
Eos: 3 %
HEMOGLOBIN: 13.7 g/dL (ref 11.1–15.9)
Hematocrit: 40.3 % (ref 34.0–46.6)
Immature Grans (Abs): 0 10*3/uL (ref 0.0–0.1)
Immature Granulocytes: 1 %
LYMPHS ABS: 2.4 10*3/uL (ref 0.7–3.1)
LYMPHS: 32 %
MCH: 28.8 pg (ref 26.6–33.0)
MCHC: 34 g/dL (ref 31.5–35.7)
MCV: 85 fL (ref 79–97)
MONOCYTES: 7 %
Monocytes Absolute: 0.5 10*3/uL (ref 0.1–0.9)
NEUTROS ABS: 4.4 10*3/uL (ref 1.4–7.0)
Neutrophils: 56 %
PLATELETS: 336 10*3/uL (ref 150–450)
RBC: 4.75 x10E6/uL (ref 3.77–5.28)
RDW: 14.3 % (ref 12.3–15.4)
WBC: 7.7 10*3/uL (ref 3.4–10.8)

## 2018-12-03 LAB — BMP8+ANION GAP
Anion Gap: 18 mmol/L (ref 10.0–18.0)
BUN / CREAT RATIO: 10 (ref 9–23)
BUN: 9 mg/dL (ref 6–24)
CALCIUM: 9.4 mg/dL (ref 8.7–10.2)
CHLORIDE: 100 mmol/L (ref 96–106)
CO2: 23 mmol/L (ref 20–29)
Creatinine, Ser: 0.88 mg/dL (ref 0.57–1.00)
GFR calc Af Amer: 88 mL/min/{1.73_m2} (ref >59)
GFR calc non Af Amer: 76 mL/min/{1.73_m2} (ref >59)
GLUCOSE: 98 mg/dL (ref 65–99)
POTASSIUM: 3.4 mmol/L — AB (ref 3.5–5.2)
Sodium: 141 mmol/L (ref 134–144)

## 2018-12-03 LAB — FERRITIN: FERRITIN: 13 ng/mL — AB (ref 15–150)

## 2018-12-04 ENCOUNTER — Encounter: Payer: Self-pay | Admitting: Internal Medicine

## 2018-12-04 MED ORDER — FLUTICASONE PROPIONATE 50 MCG/ACT NA SUSP: 1.0000 | Freq: Every day | NASAL | 2 refills | Status: DC

## 2018-12-04 MED ORDER — SIMVASTATIN 10 MG PO TABS: 10.0000 mg | ORAL_TABLET | Freq: Every day | ORAL | 3 refills | Status: DC

## 2018-12-04 NOTE — Assessment & Plan Note (Signed)
Heartburn and nausea controlled on nexium twice daily.  - continue nexium twice daily

## 2018-12-04 NOTE — Assessment & Plan Note (Signed)
BMI 41 with multiple comorbid conditions including hypertension, GERD. Tough to exercise around her home because she is not in a safe neighborhood. Has been able to cut back on sodas, switched to water. Has been able to cut back on snack foods. She is interested in aquatic exercise, and will check out [employer] membership.

## 2018-12-04 NOTE — Assessment & Plan Note (Signed)
Last CBC showed a hemoglobin of 11.8, no prior iron studies found in this medical record.  CBC today shows a hemoglobin of 13.7, ferritin is 13.  This is consistent with iron deficiency. No iron supplementation for now, will continue to monitor hemoglobin on a regular basis.

## 2018-12-04 NOTE — Assessment & Plan Note (Signed)
Requesting tx today. Father had first MI at 3344, brother had first MI at age 51 followed by 4 additional heart attacks, mother had strokes at age 51. Her last LDL was 150.  - start simvastatin 10 mg daily   - lipid panel at follow up

## 2018-12-04 NOTE — Assessment & Plan Note (Signed)
BP Readings from Last 3 Encounters:  12/02/18 126/70  05/20/18 (!) 152/89  01/24/18 124/78  Blood pressure is well controlled and below goal today. BMP when last checked about one year ago showed normal creatinine with potassium of 3.2. Since the time of last office visit she has stopped taking the amlodipine.  - discontinue Amlodipine 5 mg daily - continue lisinopril- HCTZ 40 - 25 mg daily  - follow up BMP today, renal function remains good, potassium improved to 3.4

## 2018-12-04 NOTE — Assessment & Plan Note (Signed)
Never had pulmonary function test in the past. Around age 52 she was told that she had COPD during hospital discharge for pneumonia. She gets short of breath with exertion which relieves with rest, sometimes she tries using albuterol but even when she does not have it the difficulty breathing resolves quickly with rest. She has no associated coughing or wheezing. She does not have nighttime symptoms. Symptoms are worse during allergy season or when she gets a cold or when it is hot outside. She has not used the albuterol inhaler for the past month. Hx of tobacco use, 30 years of smoking 1 PPD quit about 10 years ago. Breathing has overall improved since she stopped smoking.  - I do not see an indication for albuterol anymore, she may benefit from sinus congestion prophylaxis from an over the counter intranasal corticosteroid instead  - she would like PFT testing when she has the insurance to cover it

## 2018-12-04 NOTE — Assessment & Plan Note (Signed)
Patient is here for follow-up of lateral hip pain.  She experience a months of relief after injection at the greater trochanter in June 2019.  She returns today requesting repeat corticosteroid injection.  Again she has tenderness over the greater trochanter and gluteus medius.  I think this pain may be related to a gait or stance abnormality and would like her to go to physical therapy when she has the coverage to make this affordable.  Today a corticosteroid injection to the left greater trochanter was performed.

## 2018-12-05 NOTE — Progress Notes (Addendum)
Internal Medicine Clinic Attending  I saw and evaluated the patient.  I personally confirmed the key portions of the history and exam documented by Dr. Obie DredgeBlum and I reviewed pertinent patient test results.  The assessment, diagnosis, and plan were formulated together and I agree with the documentation in the resident's note.    I was present for procedure.   I agree with iron deficnecy in setting of normal Hgb, if she is premenopausal I would recommend iron supplementation.

## 2019-03-03 ENCOUNTER — Other Ambulatory Visit: Payer: Self-pay | Admitting: Internal Medicine

## 2019-03-03 DIAGNOSIS — I10 Essential (primary) hypertension: Secondary | ICD-10-CM

## 2019-04-08 ENCOUNTER — Telehealth: Payer: Self-pay

## 2019-04-08 NOTE — Telephone Encounter (Signed)
I agree, thank you.

## 2019-04-08 NOTE — Telephone Encounter (Signed)
Her symptoms at the end of summer 2019 predated the start of the virus spread so it would be unlikely that they were related to covid 19. She does not need to worry about testing at this time.

## 2019-04-08 NOTE — Telephone Encounter (Signed)
Pt calls and states at the end of last summer she was in ED and diag with a URI she states she thinks it may have been COVID 19. She states she had all the symptoms and states "they are saying they want people to get tested if they have already had the virus, do you all want to test me" She was reassured and told her pcp would be made aware of her concern. She was agreeable

## 2019-04-08 NOTE — Telephone Encounter (Signed)
Requesting to speak with a nurse about getting COVID-19 test, pt is not sick. Please call pt back.

## 2019-04-09 NOTE — Telephone Encounter (Signed)
Made her aware

## 2019-05-25 ENCOUNTER — Ambulatory Visit: Payer: Self-pay | Admitting: Internal Medicine

## 2019-05-25 ENCOUNTER — Other Ambulatory Visit: Payer: Self-pay

## 2019-05-25 VITALS — BP 127/90 | HR 95 | Ht 63.0 in | Wt 241.9 lb

## 2019-05-25 DIAGNOSIS — M25559 Pain in unspecified hip: Secondary | ICD-10-CM

## 2019-05-25 DIAGNOSIS — M25552 Pain in left hip: Secondary | ICD-10-CM

## 2019-05-25 NOTE — Progress Notes (Signed)
   CC: Hip Pain  HPI:   [name] is a 53 y.o. F with PMHx listed below presenting for Hip Pain. Please see the A&P for the status of the patient's chronic medical problems.   Past Medical History:  Diagnosis Date  . GERD (gastroesophageal reflux disease)   . Hypertension    Review of Systems:  Mild, Chronic, intermittent Dyspnea. ROS Performed and all others negative.  Physical Exam:  Vitals:   05/25/19 1526  BP: 127/90  Pulse: 95  SpO2: 100%  Weight: 241 lb 14.4 oz (109.7 kg)  Height: 5\' 3"  (1.6 m)   Physical Exam Constitutional:      General: She is not in acute distress.    Appearance: Normal appearance. She is obese.  Cardiovascular:     Rate and Rhythm: Normal rate and regular rhythm.     Pulses: Normal pulses.     Heart sounds: Normal heart sounds.  Pulmonary:     Effort: Pulmonary effort is normal. No respiratory distress.     Breath sounds: Normal breath sounds.  Abdominal:     General: Bowel sounds are normal. There is no distension.     Palpations: Abdomen is soft.     Tenderness: There is no abdominal tenderness.  Musculoskeletal:        General: Tenderness present. No swelling or deformity.     Comments: L Hip TTP over Greater Trochanter Pain with hip flexion IR/ER: full ROM, no pain  Skin:    General: Skin is warm and dry.    Assessment & Plan:   See Encounters Tab for problem based charting.  Patient discussed with Dr. Lynnae January

## 2019-05-25 NOTE — Assessment & Plan Note (Addendum)
Patient presents for follow up of chronic/recurrent left lateral hip pain. She has received multiple injections at the site and has pain relief for months at a time. Last injection was about 6 months ago and pain returned about 1.5 months ago.   Pain is again over her greater trochanter with intermittent radiation down her leg or towards her gluteus. She is requesting an additional hip injection for pain relief.   Patient is to follow up tomorrow for injection appointment when supervising physician for that procedure will be present. Per her PCP's last note; the hope is to get patient PT when she is able to obtain insurance coverage to improve any gait or stance abnormality that may be causing/exacerbating her symptoms. Lack of coverage has been a barrier to other work-up of the pain as well. - Return for Hip injection on 05/26/19 pm - PT when able to obtain coverage

## 2019-05-25 NOTE — Patient Instructions (Signed)
Thank you for allowing [place] to care for you  Please return tomorrow afternoon for injection of your left hip

## 2019-05-26 ENCOUNTER — Other Ambulatory Visit: Payer: Self-pay

## 2019-05-26 ENCOUNTER — Ambulatory Visit: Payer: Self-pay | Admitting: Internal Medicine

## 2019-05-26 ENCOUNTER — Encounter: Payer: Self-pay | Admitting: Internal Medicine

## 2019-05-26 VITALS — BP 133/93 | HR 90 | Temp 98.5°F | Ht 63.0 in | Wt 240.2 lb

## 2019-05-26 DIAGNOSIS — M25559 Pain in unspecified hip: Secondary | ICD-10-CM

## 2019-05-26 DIAGNOSIS — M25552 Pain in left hip: Secondary | ICD-10-CM

## 2019-05-26 NOTE — Progress Notes (Signed)
Trochanteric Bursa Injection Note  Pre-operative Diagnosis: left greater trochanteric pain syndrome   Indications: pain relief   Anesthesia: not required  Procedure Details   Consent was obtained for the procedure. The lateral hip was prepped with iodine. Using a 23 gauge needle the trochanteric bursa was injected with 1 mL 1% lidocaine and 1 mL of triamcinolone (KENALOG) 40mg /ml. The needle was removed and a dressing was applied.  Complications:  None; patient tolerated the procedure well.

## 2019-05-26 NOTE — Assessment & Plan Note (Signed)
Left Trochanteric Bursa Injection Performed. See note from 05/25/2019 for details.

## 2019-05-26 NOTE — Patient Instructions (Addendum)
Thank you for allowing [place] to care for you  You received an injection of your left trochanteric Bursa - Please take it easy for the next couple of days - Good luck with walking your new dog!

## 2019-05-27 NOTE — Progress Notes (Signed)
Internal Medicine Clinic Attending  I saw and evaluated the patient.  I personally confirmed the key portions of the history and exam documented by Dr. Melvin and I reviewed pertinent patient test results.  The assessment, diagnosis, and plan were formulated together and I agree with the documentation in the resident's note. I was present for the entirety of the procedure.  

## 2019-05-29 NOTE — Progress Notes (Signed)
Internal Medicine Clinic Attending  Case discussed with Dr. Melvin  at the time of the visit.  We reviewed the resident's history and exam and pertinent patient test results.  I agree with the assessment, diagnosis, and plan of care documented in the resident's note.  

## 2019-06-08 ENCOUNTER — Ambulatory Visit: Payer: Self-pay

## 2019-06-11 ENCOUNTER — Encounter: Payer: Self-pay | Admitting: *Deleted

## 2019-09-03 ENCOUNTER — Ambulatory Visit: Payer: Self-pay | Admitting: Internal Medicine

## 2019-09-03 ENCOUNTER — Encounter: Payer: Self-pay | Admitting: Internal Medicine

## 2019-09-03 ENCOUNTER — Other Ambulatory Visit: Payer: Self-pay

## 2019-09-03 VITALS — BP 134/88 | HR 106 | Temp 98.7°F | Ht 63.0 in | Wt 231.9 lb

## 2019-09-03 DIAGNOSIS — J449 Chronic obstructive pulmonary disease, unspecified: Secondary | ICD-10-CM

## 2019-09-03 DIAGNOSIS — Z23 Encounter for immunization: Secondary | ICD-10-CM

## 2019-09-03 DIAGNOSIS — Z0000 Encounter for general adult medical examination without abnormal findings: Secondary | ICD-10-CM

## 2019-09-03 DIAGNOSIS — Z6841 Body Mass Index (BMI) 40.0 and over, adult: Secondary | ICD-10-CM

## 2019-09-03 DIAGNOSIS — K559 Vascular disorder of intestine, unspecified: Secondary | ICD-10-CM

## 2019-09-03 DIAGNOSIS — M25552 Pain in left hip: Secondary | ICD-10-CM

## 2019-09-03 DIAGNOSIS — I10 Essential (primary) hypertension: Secondary | ICD-10-CM

## 2019-09-03 DIAGNOSIS — G8929 Other chronic pain: Secondary | ICD-10-CM

## 2019-09-03 DIAGNOSIS — M25559 Pain in unspecified hip: Secondary | ICD-10-CM

## 2019-09-03 NOTE — Progress Notes (Signed)
   CC: Left hip pain  HPI:  [name] is a 53 y.o. with hypertension, copd, morbid obesity, ischemic cholitis who presents for greater trochanteric pain. Please see problem based charting for evaluation, assessment, and plan.  Past Medical History:  Diagnosis Date  . GERD (gastroesophageal reflux disease)   . Hypertension    Review of Systems:    Review of Systems  Constitutional: Negative for chills and fever.  Respiratory: Negative for cough and shortness of breath.   Cardiovascular: Negative for chest pain.  Musculoskeletal: Positive for joint pain.  Neurological: Negative for dizziness and headaches.   Physical Exam:  Vitals:   09/03/19 1341  BP: 134/88  Pulse: (!) 106  Temp: 98.7 F (37.1 C)  TempSrc: Oral  SpO2: 99%  Weight: 231 lb 14.4 oz (105.2 kg)  Height: 5\' 3"  (1.6 m)   Physical Exam  Constitutional: She appears well-developed and well-nourished. No distress.  HENT:  Head: Normocephalic and atraumatic.  Cardiovascular: Normal rate, regular rhythm and normal heart sounds.  Respiratory: Effort normal and breath sounds normal. No respiratory distress. She has no wheezes.  GI: Soft. Bowel sounds are normal. She exhibits no distension. There is no abdominal tenderness.  Musculoskeletal:        General: Tenderness (left greater trochanteric ) and edema (1+ Pitting) present.     Comments: 3/5 muscle strength in left lower extremity, 4/5 in right lower extremity   Neurological: She is alert.  Skin: She is not diaphoretic.  Psychiatric: She has a normal mood and affect. Her behavior is normal. Judgment and thought content normal.   Assessment & Plan:   See Encounters Tab for problem based charting.  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 Left lateral  hip was prepped with alcohol wipes. Using a 23 gauge needle the left trochanteric bursa was injected with 64ml 1% lidocaine and 22ml triamcinolone (Kenalog 40 mg/ml). The needle was removed and bandage was applied.   Complications: Minimal <0.17PZ in amount bleeding at site of injection. No swelling.    Patient discussed with Dr. Evette Doffing

## 2019-09-03 NOTE — Assessment & Plan Note (Signed)
PAteint was given influenza vaccine during this encounter

## 2019-09-03 NOTE — Patient Instructions (Signed)
It was a pleasure to see you today [name]. Please make the following changes:  During this visit you received a steroid injection to your left hip. Please let us know if you receive any adverse reactions.   You also got a influenza shot today   If you have any questions or concerns, please call our clinic at [phone] between 9am-5pm and after hours call [phone] and ask for the internal medicine resident on call. If you feel you are having a medical emergency please call 911.   Thank you, we look forward to help you remain healthy!  Lars Mage, MD Internal Medicine PGY3

## 2019-09-03 NOTE — Assessment & Plan Note (Addendum)
Patient with chronic left lateral hip pain for the past 4 years. Patient describes the pain as 7/10 intensity, achy in nature, aggravated when moving around and walking, alleviated with motrin. The patient received a left trochanteric bursa injection on 05/25/19 and had relief from the injection within a few day and it lasted 2.72months. However, the patient says that she has been very active since she got the injection this time by going swimming, walking her dog, and helping her daughter move. She has been making efforts to lose weight.   The patient states that she is going on a trip to [place] to visit her aunt.   Assessment and plan  Given that it has been over 3 months since the patient her last injection, the patient was given a steroid injection for left greater trochanteric pain syndrome. Details of the procedure are provided in note

## 2019-09-04 NOTE — Progress Notes (Signed)
Internal Medicine Clinic Attending  I saw and evaluated the patient.  I personally confirmed the key portions of the history and exam documented by Dr. Chundi and I reviewed pertinent patient test results.  The assessment, diagnosis, and plan were formulated together and I agree with the documentation in the resident's note. I was present for the entirety of the procedure.   

## 2019-09-08 ENCOUNTER — Encounter: Payer: Self-pay | Admitting: Radiation Oncology

## 2020-03-08 ENCOUNTER — Other Ambulatory Visit: Payer: Self-pay

## 2020-03-08 ENCOUNTER — Ambulatory Visit (INDEPENDENT_AMBULATORY_CARE_PROVIDER_SITE_OTHER): Payer: Self-pay | Admitting: Radiation Oncology

## 2020-03-08 DIAGNOSIS — K219 Gastro-esophageal reflux disease without esophagitis: Secondary | ICD-10-CM

## 2020-03-08 DIAGNOSIS — I10 Essential (primary) hypertension: Secondary | ICD-10-CM

## 2020-03-08 DIAGNOSIS — Z0000 Encounter for general adult medical examination without abnormal findings: Secondary | ICD-10-CM

## 2020-03-08 DIAGNOSIS — M25552 Pain in left hip: Secondary | ICD-10-CM

## 2020-03-08 MED ORDER — LISINOPRIL-HYDROCHLOROTHIAZIDE 20-12.5 MG PO TABS
2.0000 | ORAL_TABLET | Freq: Every day | ORAL | 3 refills | Status: DC
Start: 2020-03-08 — End: 2021-03-27

## 2020-03-08 NOTE — Progress Notes (Signed)
  [facility] Internal Medicine Residency Telephone Encounter Continuity Care Appointment  HPI:   This telephone encounter was created for [name] on 03/08/2020 for the following purpose/c: hip pain and high blood pressure.   Past Medical History:  Past Medical History:  Diagnosis Date  . GERD (gastroesophageal reflux disease)   . Hypertension       ROS:   Left hip pain, no dizziness or light headedness  Please see A&P for full ROS.   Assessment / Plan / Recommendations:   Please see A&P under problem oriented charting for assessment of the patient's acute and chronic medical conditions.   As always, pt is advised that if symptoms worsen or new symptoms arise, they should go to an urgent care facility or to to ER for further evaluation.   Consent and Medical Decision Making:   Patient discussed with Dr. Sandre Kitty  This is a telephone encounter between [name] and Jenell Milliner on 03/08/2020 for 15 minutes. The visit was conducted with the patient located at home and Jenell Milliner at [facility]. The patient's identity was confirmed using their DOB and current address. The patient has consented to being evaluated through a telephone encounter and understands the associated risks (an examination cannot be done and the patient may need to come in for an appointment) / benefits (allows the patient to remain at home, decreasing exposure to coronavirus). I personally spent 15 minutes on medical discussion.    Jenell Milliner, MD 03/09/2020, 10:37 AM Pager: [phone]

## 2020-03-09 ENCOUNTER — Encounter: Payer: Self-pay | Admitting: Radiation Oncology

## 2020-03-09 NOTE — Assessment & Plan Note (Signed)
Patient in need of mammogram. Last one in 2015. Uninsured and concerned about cost. Patient provided with information to obtain cost-free mammogram. Mammogram ordered.

## 2020-03-09 NOTE — Assessment & Plan Note (Signed)
This is chronic and controlled.  Patient reports she has run out of her lisinopril-hctz. She reports that she checks her BP irregularly but the last time was upper 130s/80. She is tolerating the medicine well. She denies headaches or dizziness.   Plan: -refill zestoretic -fu in a few weeks for BP check and BMP

## 2020-03-09 NOTE — Assessment & Plan Note (Signed)
This is chronic and uncontrolled.  Patient with hx of L hip pain treated intermittently with steroid injections. Last injection 6 months ago. She reports return of pain without alarm sx.   Plan: -fu in a few weeks for physical exam and possible steroid injection

## 2020-03-11 NOTE — Progress Notes (Signed)
Internal Medicine Clinic Attending  Case discussed with Dr. Lanier at the time of the visit.  We reviewed the resident's history and exam and pertinent patient test results.  I agree with the assessment, diagnosis, and plan of care documented in the resident's note.  Alexander Raines, M.D., Ph.D.  

## 2020-03-17 ENCOUNTER — Encounter: Payer: Self-pay | Admitting: Internal Medicine

## 2020-03-17 ENCOUNTER — Other Ambulatory Visit: Payer: Self-pay

## 2020-03-17 ENCOUNTER — Ambulatory Visit: Payer: Self-pay | Admitting: Internal Medicine

## 2020-03-17 VITALS — BP 128/88 | HR 101 | Temp 97.7°F | Ht 62.0 in | Wt 233.8 lb

## 2020-03-17 DIAGNOSIS — Z79899 Other long term (current) drug therapy: Secondary | ICD-10-CM

## 2020-03-17 DIAGNOSIS — I10 Essential (primary) hypertension: Secondary | ICD-10-CM

## 2020-03-17 DIAGNOSIS — M25552 Pain in left hip: Secondary | ICD-10-CM

## 2020-03-17 DIAGNOSIS — Z87891 Personal history of nicotine dependence: Secondary | ICD-10-CM

## 2020-03-17 DIAGNOSIS — M25559 Pain in unspecified hip: Secondary | ICD-10-CM

## 2020-03-17 NOTE — Assessment & Plan Note (Signed)
She was recently restarted on lisinopril hctz after running out of her medication.  BP 128/88 today.  -no changes -instructed to go to lab as pcp ordered bmet for her

## 2020-03-17 NOTE — Assessment & Plan Note (Signed)
left greater trochanteric pain syndrome: takes occasional motrin, derives benefit from greater trochanter  injections along insertion of gluteus medius.  Usually alleviates pain for around 5-6 months.  With the walk into the office her pain flared up but after sitting on the exam table for a while it has improved to around a 5/10.    Plan: perform repeat injection today along the site of her pain, separate note within this encounter outlining this procedure.

## 2020-03-17 NOTE — Progress Notes (Signed)
CC: left greater trochanter pain  HPI:  [name] is a 54 y.o. female with PMH below.  Today we will address left greater trochanteric pain syndrome  Please see A&P for status of the patient's chronic medical conditions  Past Medical History:  Diagnosis Date  . GERD (gastroesophageal reflux disease)   . Hypertension    Review of Systems:  ROS: Pulmonary: pt denies increased work of breathing, shortness of breath,  Cardiac: pt denies palpitations, chest pain,  Abdominal: pt denies abdominal pain, nausea, vomiting, or diarrhea   Physical Exam:  Vitals:   03/17/20 1357  BP: 128/88  Pulse: (!) 101  Temp: 97.7 F (36.5 C)  TempSrc: Oral  SpO2: 97%  Weight: 233 lb 12.8 oz (106.1 kg)  Height: 5\' 2"  (1.575 m)   MSK: tenderness along left greater trochanter, she has normal strength and range of motion in her left lower extremity.   Pulmonary: CTAB, not in distress Psych: Alert, conversant, in good spirits   Social History   Socioeconomic History  . Marital status: Married    Spouse name: Not on file  . Number of children: 5  . Years of education: 10th grade  . Highest education level: Not on file  Occupational History  . Occupation: unemployed  Tobacco Use  . Smoking status: Former Smoker    Packs/day: 1.00    Years: 25.00    Pack years: 25.00    Types: Cigarettes    Quit date: 01/05/2008    Years since quitting: 12.2  . Smokeless tobacco: Never Used  Substance and Sexual Activity  . Alcohol use: No    Alcohol/week: 0.0 standard drinks  . Drug use: No  . Sexual activity: Not on file  Other Topics Concern  . Not on file  Social History Narrative   Lives in [place] with husband and extended family.   Social Determinants of Health   Financial Resource Strain:   . Difficulty of Paying Living Expenses:   Food Insecurity:   . Worried About [place] in the Last Year:   . Programme researcher, broadcasting/film/video in the Last Year:   Transportation Needs:   .  Barista (Medical):   Freight forwarder Lack of Transportation (Non-Medical):   Physical Activity:   . Days of Exercise per Week:   . Minutes of Exercise per Session:   Stress:   . Feeling of Stress :   Social Connections:   . Frequency of Communication with Friends and Family:   . Frequency of Social Gatherings with Friends and Family:   . Attends Religious Services:   . Active Member of Clubs or Organizations:   . Attends [employer] Meetings:   Banker Marital Status:   Intimate Partner Violence:   . Fear of Current or Ex-Partner:   . Emotionally Abused:   [employer] Physically Abused:   . Sexually Abused:    Family History  Problem Relation Age of Onset  . Hypertension Mother   . Stroke Mother   . Hypertension Father   . Heart disease Father   . COPD Sister   . Heart disease Brother   . Hypertension Brother   . Cancer Maternal Aunt   . Cancer Paternal Aunt   . Cancer Maternal Grandmother   . Heart disease Maternal Grandfather   . Hypertension Maternal Grandfather   . Heart disease Paternal Grandmother   . Diabetes Paternal Grandmother   . Heart disease Paternal Grandfather   . Hypertension  Paternal Grandfather     Assessment & Plan:   See Encounters Tab for problem based charting.  Patient seen with Dr. Evette Doffing

## 2020-03-17 NOTE — Patient Instructions (Addendum)
[name], you have received an injection today of steroid and lidocaine to help with your greater trochanteric pain syndrome.  Please let us know if you develop any symptoms we discussed such as new weakness and changes in your gait.

## 2020-03-17 NOTE — Progress Notes (Signed)
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 Left lateral hip was prepped with betadine. Using a 23 gauge needle the left trochanteric bursa was injected with 24ml 1% lidocaine and 5ml triamcinolone (Kenalog 40 mg/ml). The needle was removed and bandage was applied.   Complications: Minimal <0.44ml in amount bleeding at site of injection. No swelling.    Patient discussed with Dr. Oswaldo Done

## 2020-03-18 LAB — BMP8+ANION GAP
Anion Gap: 16 mmol/L (ref 10.0–18.0)
BUN/Creatinine Ratio: 7 — ABNORMAL LOW (ref 9–23)
BUN: 7 mg/dL (ref 6–24)
CO2: 24 mmol/L (ref 20–29)
Calcium: 9.7 mg/dL (ref 8.7–10.2)
Chloride: 96 mmol/L (ref 96–106)
Creatinine, Ser: 0.97 mg/dL (ref 0.57–1.00)
GFR calc Af Amer: 77 mL/min/{1.73_m2} (ref >59)
GFR calc non Af Amer: 67 mL/min/{1.73_m2} (ref >59)
Glucose: 90 mg/dL (ref 65–99)
Potassium: 3.3 mmol/L — ABNORMAL LOW (ref 3.5–5.2)
Sodium: 136 mmol/L (ref 134–144)

## 2020-03-21 NOTE — Progress Notes (Signed)
Internal Medicine Clinic Attending  I saw and evaluated the patient.  I personally confirmed the key portions of the history and exam documented by Dr. Frances Furbish and I reviewed pertinent patient test results.  The assessment, diagnosis, and plan were formulated together and I agree with the documentation in the resident's note.  I was present for the entirety of the procedure.

## 2020-04-22 ENCOUNTER — Encounter: Payer: Self-pay | Admitting: *Deleted

## 2020-04-27 ENCOUNTER — Ambulatory Visit: Payer: Self-pay | Admitting: Internal Medicine

## 2020-04-27 ENCOUNTER — Other Ambulatory Visit: Payer: Self-pay

## 2020-04-27 VITALS — BP 129/83 | HR 83 | Temp 98.1°F | Ht 62.0 in | Wt 231.6 lb

## 2020-04-27 DIAGNOSIS — M25552 Pain in left hip: Secondary | ICD-10-CM

## 2020-04-27 DIAGNOSIS — R1032 Left lower quadrant pain: Secondary | ICD-10-CM

## 2020-04-27 DIAGNOSIS — R809 Proteinuria, unspecified: Secondary | ICD-10-CM

## 2020-04-27 DIAGNOSIS — G8929 Other chronic pain: Secondary | ICD-10-CM

## 2020-04-27 LAB — POCT URINALYSIS DIPSTICK
Bilirubin, UA: NEGATIVE
Blood, UA: NEGATIVE
Glucose, UA: NEGATIVE
Ketones, UA: NEGATIVE
Nitrite, UA: NEGATIVE
Protein, UA: POSITIVE — AB
Spec Grav, UA: 1.02 (ref 1.010–1.025)
Urobilinogen, UA: 0.2 E.U./dL
pH, UA: 7 (ref 5.0–8.0)

## 2020-04-27 NOTE — Assessment & Plan Note (Signed)
Patient with proteinuria noted on urinalysis has not been present in the past.  Patient is not a current smoker nondiabetic.  She does have a history of hypertension.  -Recommend the patient have a repeat UA done in a few weeks

## 2020-04-27 NOTE — Patient Instructions (Signed)
It was a pleasure to see you today Ms. Newingham. Please make the following changes:  -please use voltaren gel on the left hip to help your pain  -We have done a urine culture  -please follow up in 4 weeks to recheck your urine for protein  If you have any questions or concerns, please call our clinic at [phone] between 9am-5pm and after hours call [phone] and ask for the internal medicine resident on call. If you feel you are having a medical emergency please call 911.   Thank you, we look forward to help you remain healthy!  Lorenso Courier, MD Internal Medicine PGY3  COVID-19 Vaccine Information can be found at: [email or web address] For questions related to vaccine distribution or appointments, please email [email or web address] or call 3043503469.

## 2020-04-27 NOTE — Progress Notes (Signed)
   CC: Left sided lower abdominal/hip pain  HPI:  [name] is a 54 y.o. with hypertension, obesity who presents for evaluation of left sided abdominal pain. Please see problem based charting for evaluation, assessment, and plan.  Past Medical History:  Diagnosis Date  . GERD (gastroesophageal reflux disease)   . Hypertension    Review of Systems:    Per HPI  Physical Exam:  Vitals:   04/27/20 0845  BP: 129/83  Pulse: 83  Temp: 98.1 F (36.7 C)  TempSrc: Oral  SpO2: 99%  Weight: 231 lb 9.6 oz (105.1 kg)  Height: 5\' 2"  (1.575 m)   Physical Exam  Constitutional: She appears well-developed and well-nourished. No distress.  HENT:  Head: Normocephalic and atraumatic.  Eyes: Conjunctivae are normal.  Cardiovascular: Normal rate, regular rhythm and normal heart sounds.  Respiratory: Effort normal and breath sounds normal. No respiratory distress. She has no wheezes.  GI: Soft. Bowel sounds are normal. She exhibits no distension. There is no abdominal tenderness.  Musculoskeletal:     Comments: 4/5 strength in left thumb V, 5/5 strength in right lower extremity.  Pain with straight leg raise on the left side, flexion of left hip along with internal rotation of left leg.  Skin: She is not diaphoretic.  Psychiatric: She has a normal mood and affect. Her behavior is normal. Judgment and thought content normal.     Assessment & Plan:   See Encounters Tab for problem based charting.  Patient discussed with Dr. 

## 2020-04-27 NOTE — Assessment & Plan Note (Signed)
  The patient states that she has had 5 days of pain in the left hip/left lower abdomen that she describes as 4/10 intensity, intermittently present, dull pain, worsened with movement, and alleviated with rest. She has accompanied subjective fever and chills, increased urinary urge. Denies burning with urination, dysuria, irritation or itching with urination, bleeding with urination, denies vaginal or urinary discharge. Stopped having periods in approximately 7 months.   Assessmeent and plan  Patient has chronic left hip pain that has been uncontrolled.  She has received steroid injections for this in the past which has improved her functional status.  Patient's pain during this visit is likely secondary to her chronic hip pain.  Patient has CT abdomen done and February 2019 which showed scattered diverticulosis along the distal descending and proximal sigmoid colon without evidence of diverticulitis.  Pain was thought to be secondary to a greater trochanter pain syndrome or tendinopathy.  We recommended the patient apply Voltaren gel to the site.  Due to the patient's urinary urgency will also do a urine culture given that the patient has only trace leukocytes noted on UA.  If symptoms persist the patient should get imaging and go to physical therapy.

## 2020-04-28 NOTE — Progress Notes (Signed)
Internal Medicine Clinic Attending  Case discussed with Dr. Chundi at the time of the visit.  We reviewed the resident's history and exam and pertinent patient test results.  I agree with the assessment, diagnosis, and plan of care documented in the resident's note.  Anahy Esh, M.D., Ph.D.  

## 2020-04-29 LAB — URINE CULTURE

## 2021-03-20 ENCOUNTER — Other Ambulatory Visit: Payer: Self-pay

## 2021-03-20 DIAGNOSIS — I10 Essential (primary) hypertension: Secondary | ICD-10-CM

## 2021-03-20 NOTE — Telephone Encounter (Signed)
Need refill on lisinopril-hydrochlorothiazide (ZESTORETIC) 20-12.5 MG tablet ;pt contact [phone]  [id number] - [place] ([place] - [address]

## 2021-03-20 NOTE — Telephone Encounter (Signed)
Okay, thanks. I will refuse the prescription for now until we see her.

## 2021-03-22 NOTE — Telephone Encounter (Signed)
Spoke with the pt.  Appt has been sch for 03/27/2021 @ 1:45 pm with Dr. Marchia Bond.

## 2021-03-27 ENCOUNTER — Encounter: Payer: Self-pay | Admitting: Internal Medicine

## 2021-03-27 ENCOUNTER — Other Ambulatory Visit: Payer: Self-pay

## 2021-03-27 ENCOUNTER — Other Ambulatory Visit (HOSPITAL_COMMUNITY): Payer: Self-pay

## 2021-03-27 ENCOUNTER — Ambulatory Visit: Payer: Self-pay | Admitting: Internal Medicine

## 2021-03-27 VITALS — BP 131/91 | HR 89 | Temp 98.8°F | Ht 62.0 in | Wt 234.3 lb

## 2021-03-27 DIAGNOSIS — M25559 Pain in unspecified hip: Secondary | ICD-10-CM

## 2021-03-27 DIAGNOSIS — R7303 Prediabetes: Secondary | ICD-10-CM

## 2021-03-27 DIAGNOSIS — E669 Obesity, unspecified: Secondary | ICD-10-CM

## 2021-03-27 DIAGNOSIS — K559 Vascular disorder of intestine, unspecified: Secondary | ICD-10-CM

## 2021-03-27 DIAGNOSIS — J410 Simple chronic bronchitis: Secondary | ICD-10-CM

## 2021-03-27 DIAGNOSIS — E66813 Obesity, class 3: Secondary | ICD-10-CM

## 2021-03-27 DIAGNOSIS — I739 Peripheral vascular disease, unspecified: Secondary | ICD-10-CM

## 2021-03-27 DIAGNOSIS — R809 Proteinuria, unspecified: Secondary | ICD-10-CM

## 2021-03-27 DIAGNOSIS — E782 Mixed hyperlipidemia: Secondary | ICD-10-CM

## 2021-03-27 DIAGNOSIS — I10 Essential (primary) hypertension: Secondary | ICD-10-CM

## 2021-03-27 DIAGNOSIS — Z0000 Encounter for general adult medical examination without abnormal findings: Secondary | ICD-10-CM

## 2021-03-27 DIAGNOSIS — R739 Hyperglycemia, unspecified: Secondary | ICD-10-CM

## 2021-03-27 LAB — POCT GLYCOSYLATED HEMOGLOBIN (HGB A1C): Hemoglobin A1C: 5.9 % — AB (ref 4.0–5.6)

## 2021-03-27 LAB — GLUCOSE, CAPILLARY: Glucose-Capillary: 101 mg/dL — ABNORMAL HIGH (ref 70–99)

## 2021-03-27 MED ORDER — ROSUVASTATIN CALCIUM 10 MG PO TABS
10.0000 mg | ORAL_TABLET | Freq: Every day | ORAL | 11 refills | Status: DC
  Filled 2021-03-27: qty 30, 30d supply, fill #0
  Filled 2021-04-25: qty 30, 30d supply, fill #1
  Filled 2021-05-30: qty 30, 30d supply, fill #2

## 2021-03-27 MED ORDER — ROSUVASTATIN CALCIUM 10 MG PO TABS: 10.0000 mg | ORAL_TABLET | Freq: Every day | ORAL | 11 refills | Status: DC

## 2021-03-27 MED ORDER — LISINOPRIL-HYDROCHLOROTHIAZIDE 20-12.5 MG PO TABS
2.0000 | ORAL_TABLET | Freq: Every day | ORAL | 3 refills | Status: DC
  Filled 2021-03-27 (×2): qty 60, 30d supply, fill #0
  Filled 2021-04-25: qty 60, 30d supply, fill #1
  Filled 2021-05-30: qty 60, 30d supply, fill #2
  Filled 2021-06-27: qty 60, 30d supply, fill #3
  Filled 2021-07-25: qty 60, 30d supply, fill #4
  Filled 2021-08-29 (×2): qty 60, 30d supply, fill #5
  Filled 2021-09-28: qty 60, 30d supply, fill #6
  Filled 2021-10-24: qty 60, 30d supply, fill #7
  Filled 2021-11-27: qty 60, 30d supply, fill #8

## 2021-03-27 NOTE — Patient Instructions (Signed)
Thank you, Ms.Oren Bracket for allowing [place] to provide your care today. Today we discussed blood pressure, obesity, cholesterol.    I have ordered the following labs for you:   Lab Orders     BMP8+Anion Gap     Lipid Profile     Urinalysis, Reflex Microscopic     Hepatitis C antibody     Glucose, capillary     POC Hbg A1C   Tests ordered today:  none  Referrals ordered today:    Referral Orders     Amb Ref to Medical Weight Management   Medication Changes:   There are no discontinued medications.   Meds ordered this encounter  Medications  . rosuvastatin (CRESTOR) 10 MG tablet    Sig: Take 1 tablet (10 mg total) by mouth daily.    Dispense:  30 tablet    Refill:  11      Follow up: 3 months with PCP   Should you have any questions or concerns please call the internal medicine clinic at [phone].     Dellia Cloud, D.O. [facility]

## 2021-03-27 NOTE — Progress Notes (Signed)
CC: HTN  HPI:  [name] is a 55 y.o. female with a past medical history stated below and presents today for HTN. Please see problem based assessment and plan for additional details.  Past Medical History:  Diagnosis Date  . GERD (gastroesophageal reflux disease)   . Hypertension     Current Outpatient Medications on File Prior to Visit  Medication Sig Dispense Refill  . esomeprazole (NEXIUM) 20 MG capsule Take 40 mg by mouth every evening.    . fluticasone (FLONASE) 50 MCG/ACT nasal spray Place 1 spray into both nostrils daily. 16 g 2  . norgestimate-ethinyl estradiol (SPRINTEC 28) 0.25-35 MG-MCG tablet Take 1 tablet by mouth daily. 1 Package 11  . ondansetron (ZOFRAN ODT) 8 MG disintegrating tablet 8mg  ODT q8 hours prn nausea 4 tablet 0   No current facility-administered medications on file prior to visit.    Family History  Problem Relation Age of Onset  . Hypertension Mother   . Stroke Mother   . Hypertension Father   . Heart disease Father   . COPD Sister   . Heart disease Brother   . Hypertension Brother   . Cancer Maternal Aunt   . Cancer Paternal Aunt   . Cancer Maternal Grandmother   . Heart disease Maternal Grandfather   . Hypertension Maternal Grandfather   . Heart disease Paternal Grandmother   . Diabetes Paternal Grandmother   . Heart disease Paternal Grandfather   . Hypertension Paternal Grandfather     Social History   Socioeconomic History  . Marital status: Married    Spouse name: Not on file  . Number of children: 5  . Years of education: 10th grade  . Highest education level: Not on file  Occupational History  . Occupation: unemployed  Tobacco Use  . Smoking status: Former Smoker    Packs/day: 1.00    Years: 25.00    Pack years: 25.00    Types: Cigarettes    Quit date: 01/05/2008    Years since quitting: 13.2  . Smokeless tobacco: Never Used  Substance and Sexual Activity  . Alcohol use: No    Alcohol/week: 0.0 standard  drinks  . Drug use: No  . Sexual activity: Not on file  Other Topics Concern  . Not on file  Social History Narrative   Lives in [place] with husband and extended family.   Social Determinants of Health   Financial Resource Strain: Not on file  Food Insecurity: Not on file  Transportation Needs: Not on file  Physical Activity: Not on file  Stress: Not on file  Social Connections: Not on file  Intimate Partner Violence: Not on file    Review of Systems: ROS negative except for what is noted on the assessment and plan.  Vitals:   03/27/21 1328 03/27/21 1421  BP: (!) 131/91   Pulse: (!) 102 89  Temp: 98.8 F (37.1 C)   TempSrc: Oral   SpO2: 99%   Weight: 234 lb 4.8 oz (106.3 kg)   Height: 5\' 2"  (1.575 m)      Physical Exam: Physical Exam Constitutional:      Appearance: Normal appearance.  HENT:     Head: Normocephalic and atraumatic.  Eyes:     Extraocular Movements: Extraocular movements intact.  Cardiovascular:     Rate and Rhythm: Normal rate.     Pulses: Normal pulses.     Heart sounds: Normal heart sounds.  Pulmonary:     Effort: Pulmonary effort is  normal.     Breath sounds: Normal breath sounds.  Abdominal:     General: There is no distension.     Palpations: Abdomen is soft.     Tenderness: There is no abdominal tenderness.  Musculoskeletal:        General: Normal range of motion.     Cervical back: Normal range of motion.     Right lower leg: No edema.     Left lower leg: No edema.  Skin:    General: Skin is warm and dry.  Neurological:     Mental Status: She is alert and oriented to person, place, and time. Mental status is at baseline.  Psychiatric:        Mood and Affect: Mood normal.      Assessment & Plan:   See Encounters Tab for problem based charting.  Patient discussed with Dr. Janeece Agee, D.O. [facility] Internal [facility], PGY-2 Pager: [phone], Phone: [phone] Date 03/28/2021 Time 5:33 AM

## 2021-03-28 ENCOUNTER — Encounter: Payer: Self-pay | Admitting: Internal Medicine

## 2021-03-28 DIAGNOSIS — R7303 Prediabetes: Secondary | ICD-10-CM | POA: Insufficient documentation

## 2021-03-28 LAB — HEPATITIS C ANTIBODY: Hep C Virus Ab: <0.1 s/co ratio (ref 0.0–0.9)

## 2021-03-28 LAB — URINALYSIS, ROUTINE W REFLEX MICROSCOPIC
Bilirubin, UA: NEGATIVE
Glucose, UA: NEGATIVE
Ketones, UA: NEGATIVE
Nitrite, UA: NEGATIVE
Protein,UA: NEGATIVE
RBC, UA: NEGATIVE
Specific Gravity, UA: 1.009 (ref 1.005–1.030)
Urobilinogen, Ur: 0.2 mg/dL (ref 0.2–1.0)
pH, UA: 6.5 (ref 5.0–7.5)

## 2021-03-28 LAB — BMP8+ANION GAP
Anion Gap: 17 mmol/L (ref 10.0–18.0)
BUN/Creatinine Ratio: 8 — ABNORMAL LOW (ref 9–23)
BUN: 7 mg/dL (ref 6–24)
CO2: 23 mmol/L (ref 20–29)
Calcium: 9.5 mg/dL (ref 8.7–10.2)
Chloride: 98 mmol/L (ref 96–106)
Creatinine, Ser: 0.87 mg/dL (ref 0.57–1.00)
Glucose: 90 mg/dL (ref 65–99)
Potassium: 3.6 mmol/L (ref 3.5–5.2)
Sodium: 138 mmol/L (ref 134–144)
eGFR: 79 mL/min/{1.73_m2} (ref >59)

## 2021-03-28 LAB — LIPID PANEL
Chol/HDL Ratio: 7.1 ratio — ABNORMAL HIGH (ref 0.0–4.4)
Cholesterol, Total: 312 mg/dL — ABNORMAL HIGH (ref 100–199)
HDL: 44 mg/dL (ref >39)
LDL Chol Calc (NIH): 201 mg/dL — ABNORMAL HIGH (ref 0–99)
Triglycerides: 329 mg/dL — ABNORMAL HIGH (ref 0–149)
VLDL Cholesterol Cal: 67 mg/dL — ABNORMAL HIGH (ref 5–40)

## 2021-03-28 LAB — MICROSCOPIC EXAMINATION
Casts: NONE SEEN /lpf
RBC, Urine: NONE SEEN /hpf (ref 0–2)

## 2021-03-28 NOTE — Assessment & Plan Note (Signed)
Consider further work up in the near future as this limits her mobility. I did not have time to address this fully. May benefit from sports medicine referral.

## 2021-03-28 NOTE — Assessment & Plan Note (Signed)
Patient has a history of hyperglycemia. A1c today indicated prediabetes. Will refer to [facility] for diabetic education.

## 2021-03-28 NOTE — Assessment & Plan Note (Signed)
She is not symptomatic and not on any inhalers. If she does become more symptomatic than she will need PFT's.

## 2021-03-28 NOTE — Assessment & Plan Note (Signed)
I spent a large portion of out time discussing the importance of weight loss of a means of preventing worsening of her comorbidities. She highlighted long term goals such as staying independent to be present with her 5 kids and grandchild. She will likely need medication assistance to help her reach her goal such as a GLP-1.  I will refer her to [facility] for education as she does have prediabetes as well.

## 2021-03-28 NOTE — Assessment & Plan Note (Signed)
I am unsure of this diagnosis. It is likely that she had a diverticular bleed based on her past colonoscopy. She does state that she is concerned that she may have inflammatory bowel disease. This will need to be looked into in the near future.

## 2021-03-28 NOTE — Assessment & Plan Note (Addendum)
She has significant mixed hyperlipidemia with evidence of CAD/PAD on pervious imaging. Therefore, I will start rosuvastatin 10 mg daily.    **ADDENDUM**  Patient has significantly elevated LDL as shown below:  Lipid Panel     Component Value Date/Time   CHOL 312 (H) 03/27/2021 1343   TRIG 329 (H) 03/27/2021 1343   HDL 44 03/27/2021 1343   CHOLHDL 7.1 (H) 03/27/2021 1343   CHOLHDL 6.4 12/21/2011 1203   VLDL 74 (H) 12/21/2011 1203   LDLCALC 201 (H) 03/27/2021 1343   LABVLDL 67 (H) 03/27/2021 1343   Therefore, she would benefit from starting high intensity statin medication as well as zetia. If this does not appropriately control her LDL then she may need to be referred to the lipid clinic. She is currently working on getting the orange card.

## 2021-03-28 NOTE — Assessment & Plan Note (Signed)
Will get HCV today. She will need a repeat mammogram this year.

## 2021-03-28 NOTE — Assessment & Plan Note (Signed)
Assessment: Patient presents today for further evaluation and management of her HTN.   Her blood pressure is uncontrolled on the following medications:  1. Lisinopril-HCTz 20-12.4 mg    She admits to being adherent with her antihypertensive medications.   She identifies the following barriers: Knowledge Deficits related to not knowing the risk of long standing HTN Financial Constraints.   She admits to no side effects noted from her medication.   She is currently taking the following medications, which could be contributing to his HTN: none  She has the following underlying comorbidities that could affect his HTN treatment: peripheral artery disease. dyslipidemia, family history of premature cardiovascular disease, hypertension, obesity (BMI >= 30 kg/m2) and smoking/ tobacco exposure (quit smoking, 15 years ago)  Current exercise regimen: none, as walking is painful due to her hip tendonopathy, but plans to get back in the pool once it gets warmer.  Diet habits: no longer eats fried food, stopped eating potato chips but continues to have difficulty with sodas.  Checking BP at home: No ,  therefore, not taking a log of her blood pressures.  Recent blood pressure are listed below:  Blood Pressure 03/27/2021 04/27/2020  BP 131/91 129/83  Some recent data might be hidden    Current metabolic panels:  BMP Latest Ref Rng & Units 03/17/2020 12/02/2018 01/23/2018  Glucose 65 - 99 mg/dL 90 98 99  BUN 6 - 24 mg/dL 7 9 6   Creatinine 0.57 - 1.00 mg/dL 6.60 6.30  BUN/Creat Ratio 9 - 23 7(L) 10 -  Sodium 134 - 144 mmol/L 136 141 136  Potassium 3.5 - 5.2 mmol/L 3.3(L) 3.4(L) 3.2(L)  Chloride 96 - 106 mmol/L 96 100 104  CO2 20 - 29 mmol/L 24 23 24   Calcium 8.7 - 10.2 mg/dL 9.7 9.4 1.60)     Plan: 1. Continue current antihypertensive medications, but will need titration at follow up visit 2. Goal BP: <130/80 3. Instructed to keep a BP log: will need a BP cuff at home 4. Recommended  DASH diet, maintaining < 2.3 g/day Salt intake, and limiting ETOH use (< 2 drinks/day for women and < 3 drinks/day for Men) 5. Recommended Weight loss & aerobic exercise  (at least 150 minutes of moderate-intensity - 30 minutes for 5+ days/week)

## 2021-03-28 NOTE — Assessment & Plan Note (Signed)
History of proteinuria. Will repeat U/A today.

## 2021-04-05 NOTE — Progress Notes (Signed)
Internal Medicine Clinic Attending  Case discussed with Dr. Coe  At the time of the visit.  We reviewed the resident's history and exam and pertinent patient test results.  I agree with the assessment, diagnosis, and plan of care documented in the resident's note.  

## 2021-04-18 ENCOUNTER — Ambulatory Visit: Payer: Self-pay

## 2021-04-25 ENCOUNTER — Other Ambulatory Visit (HOSPITAL_COMMUNITY): Payer: Self-pay

## 2021-05-02 NOTE — Addendum Note (Signed)
Addended by: Neomia Dear on: 05/02/2021 11:32 AM   Modules accepted: Orders

## 2021-05-30 ENCOUNTER — Other Ambulatory Visit (HOSPITAL_COMMUNITY): Payer: Self-pay

## 2021-06-27 ENCOUNTER — Other Ambulatory Visit (HOSPITAL_COMMUNITY): Payer: Self-pay

## 2021-07-26 ENCOUNTER — Other Ambulatory Visit (HOSPITAL_COMMUNITY): Payer: Self-pay

## 2021-08-29 ENCOUNTER — Other Ambulatory Visit (HOSPITAL_COMMUNITY): Payer: Self-pay

## 2021-09-06 ENCOUNTER — Telehealth: Payer: Self-pay

## 2021-09-06 NOTE — Telephone Encounter (Signed)
Pt is calling to let Dr Marchia Bond  know that she appreciates his advice  she stated that he was very honest with her and since her visit she has lose 41 pounds as of 09/05/21.. she also stated that none of her pervious DR told her the truth

## 2021-09-29 ENCOUNTER — Other Ambulatory Visit (HOSPITAL_COMMUNITY): Payer: Self-pay

## 2021-10-24 ENCOUNTER — Other Ambulatory Visit (HOSPITAL_COMMUNITY): Payer: Self-pay

## 2021-11-27 ENCOUNTER — Other Ambulatory Visit (HOSPITAL_COMMUNITY): Payer: Self-pay

## 2021-12-19 ENCOUNTER — Encounter: Payer: Self-pay | Admitting: Internal Medicine

## 2021-12-19 ENCOUNTER — Other Ambulatory Visit (HOSPITAL_COMMUNITY): Payer: Self-pay

## 2021-12-19 ENCOUNTER — Ambulatory Visit: Payer: Self-pay | Admitting: Internal Medicine

## 2021-12-19 VITALS — BP 144/82 | HR 86 | Temp 98.3°F | Ht 60.2 in | Wt 196.0 lb

## 2021-12-19 DIAGNOSIS — I10 Essential (primary) hypertension: Secondary | ICD-10-CM

## 2021-12-19 DIAGNOSIS — Z0000 Encounter for general adult medical examination without abnormal findings: Secondary | ICD-10-CM

## 2021-12-19 DIAGNOSIS — R7303 Prediabetes: Secondary | ICD-10-CM

## 2021-12-19 DIAGNOSIS — M25559 Pain in unspecified hip: Secondary | ICD-10-CM

## 2021-12-19 DIAGNOSIS — E782 Mixed hyperlipidemia: Secondary | ICD-10-CM

## 2021-12-19 DIAGNOSIS — I739 Peripheral vascular disease, unspecified: Secondary | ICD-10-CM

## 2021-12-19 MED ORDER — OLMESARTAN MEDOXOMIL-HCTZ 40-12.5 MG PO TABS
1.0000 | ORAL_TABLET | Freq: Every day | ORAL | 0 refills | Status: DC
  Filled 2021-12-19: qty 30, 30d supply, fill #0

## 2021-12-19 MED ORDER — ROSUVASTATIN CALCIUM 10 MG PO TABS
10.0000 mg | ORAL_TABLET | Freq: Every day | ORAL | 11 refills | Status: DC
  Filled 2021-12-19: qty 30, 30d supply, fill #0
  Filled 2022-01-19: qty 30, 30d supply, fill #1
  Filled 2022-02-20: qty 30, 30d supply, fill #2
  Filled 2022-03-22: qty 30, 30d supply, fill #3
  Filled 2022-04-20: qty 30, 30d supply, fill #4

## 2021-12-19 MED ORDER — ESOMEPRAZOLE MAGNESIUM 20 MG PO CPDR
40.0000 mg | DELAYED_RELEASE_CAPSULE | Freq: Every evening | ORAL | 0 refills | Status: DC
  Filled 2021-12-19 – 2021-12-20 (×2): qty 60, 30d supply, fill #0

## 2021-12-19 NOTE — Patient Instructions (Addendum)
Dear [name],  Thank you for trusting [place] with your care today.  Today we discussed your blood pressure, high cholesterol, and health insurance.   We have sent in an increased dose of blood pressure medication to better help control your high blood pressure. Please take this medication by mouth once daily and log your blood pressure.  We have also sent in your Crestor prescription to help control your cholesterol. Please take this medication as it can help prevent heart disease.  We would like to see you back in 1 month to check lab work and follow up on your medications and health insurance.

## 2021-12-19 NOTE — Assessment & Plan Note (Signed)
Patient reports improvement of hip pain with weight loss. Sh notes improved exercise tolerance. No pain in hip today. She is able to control occasional flairs with advil and warm soaks.  Pain appears to be controlled at this time through conservative measures. Will continue to encourage weight loss. May benefit from exercises to strengthen muscles.

## 2021-12-19 NOTE — Progress Notes (Signed)
° °  CC: check up and med refill  HPI:[name] is a 56 y.o. female who presents for evaluation of check up and medication refill. Please see individual problem based A/P for details.   Depression, PHQ-9: Based on the patients  [place] Visit from 03/27/2021 in [place]  PHQ-9 Total Score 2      score we have .  Past Medical History:  Diagnosis Date   GERD (gastroesophageal reflux disease)    Hypertension    Review of Systems:   Review of Systems  Constitutional:  Positive for weight loss.  HENT: Negative.    Eyes: Negative.   Respiratory: Negative.    Cardiovascular: Negative.   Gastrointestinal: Negative.   Genitourinary: Negative.   Musculoskeletal: Negative.   Skin: Negative.   Neurological: Negative.   Endo/Heme/Allergies: Negative.   Psychiatric/Behavioral: Negative.      Physical Exam: Vitals:   12/19/21 1544  BP: (!) 144/82  Pulse: 86  Temp: 98.3 F (36.8 C)  TempSrc: Oral  SpO2: 100%  Weight: 196 lb (88.9 kg)  Height: 5' 0.2" (1.529 m)     General: alert and oriented obese female, no acute distress HEENT: Conjunctiva nl , antiicteric sclerae, moist mucous membranes, no exudate or erythema Cardiovascular: Normal rate, regular rhythm.  No murmurs, rubs, or gallops Pulmonary : Equal breath sounds, No wheezes, rales, or rhonchi Abdominal: soft, nontender,  bowel sounds present Ext: No edema in lower extremities, no tenderness to palpation of lower extremities.   Assessment & Plan:   See Encounters Tab for problem based charting.  Patient discussed with Dr. Jimmye Norman

## 2021-12-19 NOTE — Assessment & Plan Note (Addendum)
Patient was started on Crestor 10mg  at her last office visit. She reports that she took the medication for a brief time but stopped taking it shortly afterwards citing nausea as her primary reason for stopping. She was not experiencing any vomiting or muscle pain.   Counseled the patient on the importance of adequate lipid control to reduce cardiovascular risk. She is willing to restart her medication. Can assess tolerance at follow up visit. Will need to check lipid panel at that time. Deferred checking lipid panel today despite weight loss since she has not been taking her medication, but this can be rechecked if she is compliant. If she is tolerating statin, and lipid panel remains above goal of LDL <100, will likely need to increase dose. Given patient's significantly elevated LDL 201, high intensity statin may not be adequate and she will likely require additional medications.

## 2021-12-19 NOTE — Assessment & Plan Note (Addendum)
Patient reports good compliance with her lisinopril-hctz 20-12.5. She also states that she has been monitoring sodium intake and tries to limit this. Blood pressure remains above goal, [id number] systolic today. She has been recording BP at home as well with most recordings in the 130-140's. Does endorse that home BP recordings are improved since losing approximately 40lbs. She reports no side effects from her current regimen and asymptomatic.    Despite medication adherence, and weight loss through lifestyle modifications, BP remains above goal. Would like to maintain BP <130/80 given her other comorbidities and significant family history of heart disease.  Will plan to up-titrate medication dose. Currently on lisinopril-hctz 20-12.5. Will start on olmesartan-hctz 40-12.5. Plan for follow up appointment in 1 month to check BMP and BP.

## 2021-12-19 NOTE — Assessment & Plan Note (Signed)
Patient is overdue for several health maintenance items including mammogram, pap smear, and flu shot. Unable to address these issues today, however, these can be addressed at follow up.   Additionally, patient has several medical conditions that will likely need further workup and management, including a prior diagnosis of COPD, although she has never formally had PFTs. Her LDL is markedly elevated, and high intensity statin likely will not adequately control this alone, however, her lack of health insurance precludes her from receiving these tests and medications due to cost. She previously began the process of applying for an orange card, but unfortunately did not complete this. Advised her to restart application process.

## 2021-12-19 NOTE — Assessment & Plan Note (Addendum)
Patient reports that since her last visit, she has lost approximately 40lbs.  She reports eating a healthier diet, she is watching total caloric intake. She is determined to lose more weight, stating she feels better, and has noticed a decrease in hip pain and better exercise tolerance.  Also determined to live a healthier life to serve as good example for her daughter.   Encouraged continued weightloss through diet and lifestyle modifications.

## 2021-12-20 ENCOUNTER — Other Ambulatory Visit (HOSPITAL_COMMUNITY): Payer: Self-pay

## 2021-12-28 ENCOUNTER — Telehealth: Payer: Self-pay | Admitting: Student

## 2021-12-28 NOTE — Telephone Encounter (Signed)
Patient requesting a call back about the following medication.  Patient states since starting BP meds her bp has been running high and she would like to know what she should do.   olmesartan-hydrochlorothiazide (BENICAR HCT) 40-12.5 MG tablet

## 2022-01-03 NOTE — Telephone Encounter (Signed)
Called patient to discuss blood pressure in response to her MyChart message stating continued BP elevation despite increase in BP medication. She reports that her blood pressure readings have since improved and are now reading around 123/83. She did report that she is having a headache and hot flashes which she associates with statin use. States she has dealt with this in the past and stopped taking her statin with resolution previously. She has continued to take statin this time. Will plan to office visit later this month for blood pressure check, lab work, and to discuss statin use and alternatives.

## 2022-01-15 ENCOUNTER — Encounter: Payer: Self-pay | Admitting: Internal Medicine

## 2022-01-18 ENCOUNTER — Other Ambulatory Visit: Payer: Self-pay

## 2022-01-18 ENCOUNTER — Ambulatory Visit: Payer: Self-pay | Admitting: Student

## 2022-01-18 ENCOUNTER — Other Ambulatory Visit (HOSPITAL_COMMUNITY): Payer: Self-pay

## 2022-01-18 ENCOUNTER — Encounter: Payer: Self-pay | Admitting: Student

## 2022-01-18 VITALS — BP 136/95 | HR 96 | Temp 98.4°F | Resp 24 | Ht 62.0 in | Wt 194.1 lb

## 2022-01-18 DIAGNOSIS — G4700 Insomnia, unspecified: Secondary | ICD-10-CM

## 2022-01-18 DIAGNOSIS — Z1231 Encounter for screening mammogram for malignant neoplasm of breast: Secondary | ICD-10-CM

## 2022-01-18 DIAGNOSIS — Z0000 Encounter for general adult medical examination without abnormal findings: Secondary | ICD-10-CM

## 2022-01-18 DIAGNOSIS — Z9189 Other specified personal risk factors, not elsewhere classified: Secondary | ICD-10-CM | POA: Insufficient documentation

## 2022-01-18 DIAGNOSIS — Z79899 Other long term (current) drug therapy: Secondary | ICD-10-CM

## 2022-01-18 DIAGNOSIS — E7800 Pure hypercholesterolemia, unspecified: Secondary | ICD-10-CM

## 2022-01-18 DIAGNOSIS — I10 Essential (primary) hypertension: Secondary | ICD-10-CM

## 2022-01-18 MED ORDER — OLMESARTAN MEDOXOMIL-HCTZ 40-12.5 MG PO TABS
1.0000 | ORAL_TABLET | Freq: Every day | ORAL | 2 refills | Status: DC
  Filled 2022-01-18: qty 30, 30d supply, fill #0
  Filled 2022-02-20: qty 30, 30d supply, fill #1
  Filled 2022-03-22: qty 30, 30d supply, fill #2

## 2022-01-18 NOTE — Progress Notes (Signed)
Internal Medicine Clinic Attending  Case discussed with Dr. Jinwala  At the time of the visit.  We reviewed the resident's history and exam and pertinent patient test results.  I agree with the assessment, diagnosis, and plan of care documented in the resident's note.  

## 2022-01-18 NOTE — Patient Instructions (Signed)
[name],  It was a pleasure seeing you in the clinic today.   We discussed the following today: I have placed a referral for you to get your mammogram done. Please continue taking your crestor daily. I do not believe that it is causing your sleep difficulty and headaches. You will need a sleep study in the future, but we can wait until your insurance kicks in. We are checking some labs today, I will call you with the results.  Please call our clinic at [phone] if you have any questions or concerns. The best time to call is Monday-Friday from 9am-4pm, but there is someone available 24/7 at the same number. If you need medication refills, please notify your pharmacy one week in advance and they will send [place] a request.   Thank you for letting us take part in your care. We look forward to seeing you next time!

## 2022-01-18 NOTE — Assessment & Plan Note (Signed)
Patient with history of HLD, on crestor 10mg . Has not been taking crestor daily given symptoms of insomnia and headaches, which she attributed to crestor. Discussed with patient that this is likely not the case and symptoms may be 2/2 another underlying condition, such as possible OSA (see separate problem).   Discussed importance of primary prevention with statin. She is willing to continue crestor at this time. Will need repeat lipid panel once consistently compliant with statin therapy.  Plan: -continue crestor -repeat lipid panel once consistently compliant with crestor

## 2022-01-18 NOTE — Assessment & Plan Note (Addendum)
Patient with history of obesity and hypertension. She is complaining of insomnia and headaches intermittently.  States that she has difficulty falling asleep and at times wakes up during sleep. States she is generally a light sleeper.  States that her headaches are typically generalized and somewhat annoying, but bearable.  Headaches are typically localized to the middle of her forehead and she denies any photophobia, lacrimation, functional limitation. She initially attributed symptoms to adverse effect of Crestor, however discussed with patient that this is likely not the case. She has never undergone a sleep study in the past.  STOP-BANG score of 5, which places her at high risk.  Epworth sleepiness scale score of 1. Given history of obesity and hypertension along with symptoms of intermittent insomnia and headaches, would be beneficial to rule out obstructive sleep apnea with a sleep study. Unfortunately, patient is currently uninsured. Fortunately, her symptoms are not functionally limiting.  Plan: -Sleep study once insured -counseled on sleep hygiene

## 2022-01-18 NOTE — Progress Notes (Signed)
° °  CC: HTN f/u, insomnia, intermittent headaches.  HPI:  [name] is a 56 y.o. female with history listed below presenting to the [facility] for hypertension follow up, insomnia, intermittent headaches. Please see individualized problem based charting for full HPI.  Past Medical History:  Diagnosis Date   GERD (gastroesophageal reflux disease)    Hypertension     Review of Systems:  Negative aside from that listed in individualized problem based charting.  Physical Exam:  Vitals:   01/18/22 1016 01/18/22 1021  BP: (!) 134/96 (!) 136/95  Pulse: 97 96  Resp: (!) 24   Temp: 98.4 F (36.9 C)   TempSrc: Oral   SpO2: 100%   Weight: 194 lb 1.6 oz (88 kg)   Height: 5\' 2"  (1.575 m)    Physical Exam Constitutional:      Appearance: She is obese. She is not ill-appearing.  HENT:     Mouth/Throat:     Mouth: Mucous membranes are moist.     Pharynx: Oropharynx is clear. No oropharyngeal exudate.  Eyes:     Extraocular Movements: Extraocular movements intact.     Conjunctiva/sclera: Conjunctivae normal.     Pupils: Pupils are equal, round, and reactive to light.  Cardiovascular:     Rate and Rhythm: Normal rate and regular rhythm.     Pulses: Normal pulses.     Heart sounds: Normal heart sounds. No murmur heard.   No gallop.  Pulmonary:     Effort: Pulmonary effort is normal.     Breath sounds: Normal breath sounds. No wheezing, rhonchi or rales.  Abdominal:     General: Bowel sounds are normal. There is no distension.     Palpations: Abdomen is soft.     Tenderness: There is no abdominal tenderness.  Musculoskeletal:        General: No swelling. Normal range of motion.     Cervical back: Normal range of motion.  Skin:    General: Skin is warm and dry.  Neurological:     General: No focal deficit present.     Mental Status: She is alert and oriented to person, place, and time.  Psychiatric:        Mood and Affect: Mood normal.        Behavior: Behavior normal.      Assessment & Plan:   See Encounters Tab for problem based charting.  Patient discussed with Dr. 

## 2022-01-18 NOTE — Addendum Note (Signed)
Addended by: Burnell Blanks on: 01/18/2022 02:25 PM   Modules accepted: Level of Service

## 2022-01-18 NOTE — Assessment & Plan Note (Signed)
Placed referral for mammogram. 

## 2022-01-18 NOTE — Assessment & Plan Note (Addendum)
With history of hypertension, currently on Benicar 40-12.5mg  daily. BP today 134/96, with repeat 136/95. Home blood pressure log with blood pressures mainly at goal.  Denies any adverse effects from recent changes in dose.  Will obtain a BMP today to evaluate electrolytes and kidney function.  Plan: -Continue Benicar, refilled today -Follow-up BMP

## 2022-01-19 ENCOUNTER — Other Ambulatory Visit (HOSPITAL_COMMUNITY): Payer: Self-pay

## 2022-01-19 ENCOUNTER — Other Ambulatory Visit: Payer: Self-pay | Admitting: Internal Medicine

## 2022-01-19 LAB — BMP8+ANION GAP
Anion Gap: 14 mmol/L (ref 10.0–18.0)
BUN/Creatinine Ratio: 7 — ABNORMAL LOW (ref 9–23)
BUN: 6 mg/dL (ref 6–24)
CO2: 24 mmol/L (ref 20–29)
Calcium: 9.4 mg/dL (ref 8.7–10.2)
Chloride: 101 mmol/L (ref 96–106)
Creatinine, Ser: 0.82 mg/dL (ref 0.57–1.00)
Glucose: 89 mg/dL (ref 70–99)
Potassium: 4.1 mmol/L (ref 3.5–5.2)
Sodium: 139 mmol/L (ref 134–144)
eGFR: 85 mL/min/{1.73_m2} (ref >59)

## 2022-01-22 ENCOUNTER — Other Ambulatory Visit (HOSPITAL_COMMUNITY): Payer: Self-pay

## 2022-01-22 MED ORDER — ESOMEPRAZOLE MAGNESIUM 20 MG PO CPDR
40.0000 mg | DELAYED_RELEASE_CAPSULE | Freq: Every evening | ORAL | 0 refills | Status: DC
  Filled 2022-01-22: qty 60, 30d supply, fill #0

## 2022-01-24 ENCOUNTER — Other Ambulatory Visit (HOSPITAL_COMMUNITY): Payer: Self-pay

## 2022-01-24 ENCOUNTER — Other Ambulatory Visit: Payer: Self-pay | Admitting: Student

## 2022-01-24 MED ORDER — ESOMEPRAZOLE MAGNESIUM 20 MG PO CPDR
40.0000 mg | DELAYED_RELEASE_CAPSULE | Freq: Every evening | ORAL | 0 refills | Status: DC
Start: 2022-01-24 — End: 2022-02-20
  Filled 2022-01-24: qty 60, 30d supply, fill #0

## 2022-02-20 ENCOUNTER — Other Ambulatory Visit (HOSPITAL_COMMUNITY): Payer: Self-pay

## 2022-02-20 ENCOUNTER — Other Ambulatory Visit: Payer: Self-pay | Admitting: Student

## 2022-02-21 ENCOUNTER — Other Ambulatory Visit (HOSPITAL_COMMUNITY): Payer: Self-pay

## 2022-02-21 ENCOUNTER — Other Ambulatory Visit: Payer: Self-pay | Admitting: Student

## 2022-02-22 ENCOUNTER — Other Ambulatory Visit: Payer: Self-pay | Admitting: Student

## 2022-02-22 ENCOUNTER — Other Ambulatory Visit (HOSPITAL_COMMUNITY): Payer: Self-pay

## 2022-02-23 ENCOUNTER — Other Ambulatory Visit (HOSPITAL_COMMUNITY): Payer: Self-pay

## 2022-02-23 MED ORDER — ESOMEPRAZOLE MAGNESIUM 20 MG PO CPDR
40.0000 mg | DELAYED_RELEASE_CAPSULE | Freq: Every evening | ORAL | 0 refills | Status: DC
  Filled 2022-02-23: qty 60, 30d supply, fill #0

## 2022-02-27 ENCOUNTER — Other Ambulatory Visit: Payer: Self-pay | Admitting: Obstetrics and Gynecology

## 2022-02-27 DIAGNOSIS — Z1231 Encounter for screening mammogram for malignant neoplasm of breast: Secondary | ICD-10-CM

## 2022-03-22 ENCOUNTER — Other Ambulatory Visit: Payer: Self-pay | Admitting: Student

## 2022-03-23 ENCOUNTER — Other Ambulatory Visit (HOSPITAL_COMMUNITY): Payer: Self-pay

## 2022-03-26 ENCOUNTER — Other Ambulatory Visit: Payer: Self-pay | Admitting: Student

## 2022-03-26 ENCOUNTER — Other Ambulatory Visit (HOSPITAL_COMMUNITY): Payer: Self-pay

## 2022-03-27 ENCOUNTER — Other Ambulatory Visit (HOSPITAL_COMMUNITY): Payer: Self-pay

## 2022-03-27 MED ORDER — ESOMEPRAZOLE MAGNESIUM 20 MG PO CPDR
40.0000 mg | DELAYED_RELEASE_CAPSULE | Freq: Every evening | ORAL | 0 refills | Status: DC
  Filled 2022-03-27: qty 60, 30d supply, fill #0

## 2022-04-05 ENCOUNTER — Ambulatory Visit: Payer: Self-pay

## 2022-04-20 ENCOUNTER — Other Ambulatory Visit (HOSPITAL_COMMUNITY): Payer: Self-pay

## 2022-04-20 ENCOUNTER — Other Ambulatory Visit: Payer: Self-pay | Admitting: Student

## 2022-04-23 ENCOUNTER — Other Ambulatory Visit (HOSPITAL_COMMUNITY): Payer: Self-pay

## 2022-04-23 MED ORDER — ESOMEPRAZOLE MAGNESIUM 20 MG PO CPDR
40.0000 mg | DELAYED_RELEASE_CAPSULE | Freq: Every evening | ORAL | 0 refills | Status: DC
  Filled 2022-04-23: qty 60, 30d supply, fill #0

## 2022-04-23 MED ORDER — OLMESARTAN MEDOXOMIL-HCTZ 40-12.5 MG PO TABS
1.0000 | ORAL_TABLET | Freq: Every day | ORAL | 2 refills | Status: DC
  Filled 2022-04-23: qty 30, 30d supply, fill #0
  Filled 2022-05-21: qty 30, 30d supply, fill #1

## 2022-05-21 ENCOUNTER — Telehealth: Payer: Self-pay | Admitting: Student

## 2022-05-21 ENCOUNTER — Other Ambulatory Visit (HOSPITAL_COMMUNITY): Payer: Self-pay

## 2022-05-21 NOTE — Telephone Encounter (Signed)
Refill Request  olmesartan-hydrochlorothiazide (BENICAR HCT) 40-12.5 MG tablet  [facility]

## 2022-05-22 ENCOUNTER — Other Ambulatory Visit (HOSPITAL_COMMUNITY): Payer: Self-pay

## 2022-05-22 NOTE — Telephone Encounter (Signed)
Call to Pharmacy patient  has a 28 day refill left and not 30.   Patient will need another prescription written if you would like for it to go a month for her per Pharmacy.

## 2022-05-24 ENCOUNTER — Encounter: Payer: Self-pay | Admitting: Student

## 2022-05-24 ENCOUNTER — Ambulatory Visit: Payer: Self-pay | Admitting: Student

## 2022-05-24 ENCOUNTER — Other Ambulatory Visit (HOSPITAL_COMMUNITY): Payer: Self-pay

## 2022-05-24 ENCOUNTER — Other Ambulatory Visit: Payer: Self-pay | Admitting: Student

## 2022-05-24 ENCOUNTER — Other Ambulatory Visit: Payer: Self-pay

## 2022-05-24 VITALS — BP 148/87 | HR 86 | Temp 97.8°F | Resp 32 | Ht 62.0 in | Wt 204.1 lb

## 2022-05-24 DIAGNOSIS — R7303 Prediabetes: Secondary | ICD-10-CM

## 2022-05-24 DIAGNOSIS — E782 Mixed hyperlipidemia: Secondary | ICD-10-CM

## 2022-05-24 DIAGNOSIS — I10 Essential (primary) hypertension: Secondary | ICD-10-CM

## 2022-05-24 DIAGNOSIS — Z0000 Encounter for general adult medical examination without abnormal findings: Secondary | ICD-10-CM

## 2022-05-24 DIAGNOSIS — I739 Peripheral vascular disease, unspecified: Secondary | ICD-10-CM

## 2022-05-24 DIAGNOSIS — E7800 Pure hypercholesterolemia, unspecified: Secondary | ICD-10-CM

## 2022-05-24 MED ORDER — ROSUVASTATIN CALCIUM 10 MG PO TABS
10.0000 mg | ORAL_TABLET | Freq: Every day | ORAL | 11 refills | Status: DC
  Filled 2022-05-24: qty 30, 30d supply, fill #0
  Filled 2022-06-20: qty 30, 30d supply, fill #1
  Filled 2022-07-20: qty 30, 30d supply, fill #2
  Filled 2022-08-18: qty 30, 30d supply, fill #3
  Filled 2022-09-14: qty 30, 30d supply, fill #4
  Filled 2022-10-15 – 2022-10-23 (×2): qty 30, 30d supply, fill #5
  Filled 2022-11-19: qty 30, 30d supply, fill #6

## 2022-05-24 MED ORDER — OLMESARTAN MEDOXOMIL-HCTZ 40-12.5 MG PO TABS
1.0000 | ORAL_TABLET | Freq: Every day | ORAL | 5 refills | Status: DC
Start: 2022-05-24 — End: 2022-12-21
  Filled 2022-05-24 – 2022-06-20 (×2): qty 30, 30d supply, fill #0
  Filled 2022-07-20: qty 30, 30d supply, fill #1
  Filled 2022-08-18: qty 30, 30d supply, fill #2
  Filled 2022-09-14: qty 30, 30d supply, fill #3
  Filled 2022-10-15 – 2022-10-23 (×2): qty 30, 30d supply, fill #4
  Filled 2022-11-19: qty 30, 30d supply, fill #5

## 2022-05-24 MED ORDER — AMLODIPINE BESYLATE 5 MG PO TABS
5.0000 mg | ORAL_TABLET | Freq: Every day | ORAL | 5 refills | Status: DC
  Filled 2022-05-24: qty 30, 30d supply, fill #0
  Filled 2022-06-20: qty 30, 30d supply, fill #1
  Filled 2022-07-20: qty 30, 30d supply, fill #2
  Filled 2022-08-18: qty 30, 30d supply, fill #3
  Filled 2022-09-14: qty 30, 30d supply, fill #4
  Filled 2022-10-15 – 2022-10-23 (×2): qty 30, 30d supply, fill #5

## 2022-05-24 MED ORDER — ESOMEPRAZOLE MAGNESIUM 20 MG PO CPDR
40.0000 mg | DELAYED_RELEASE_CAPSULE | Freq: Every evening | ORAL | 0 refills | Status: DC
  Filled 2022-05-24: qty 60, 30d supply, fill #0

## 2022-05-24 NOTE — Patient Instructions (Addendum)
[name],  It was a pleasure seeing you in the clinic today.   I have prescribed a new blood pressure medicine called Amlodipine to help better control your blood pressure. We are checking some lab work today (cholesterol, sugar level) and I will call you when I get those results. Please continue keeping a blood pressure log at home and bring it to your next visit in 3 weeks. We need to perform your PAP smear for cervical cancer screening at your next visit.  Please call our clinic at [phone] if you have any questions or concerns. The best time to call is Monday-Friday from 9am-4pm, but there is someone available 24/7 at the same number. If you need medication refills, please notify your pharmacy one week in advance and they will send [place] a request.   Thank you for letting us take part in your care. We look forward to seeing you next time!

## 2022-05-24 NOTE — Progress Notes (Signed)
   CC: f/u HTN  HPI:  [name] is a 56 y.o. female with history listed below presenting to the [facility] for f/u HTN. Please see individualized problem based charting for full HPI.  Past Medical History:  Diagnosis Date   GERD (gastroesophageal reflux disease)    Hypertension     Review of Systems:  Negative aside from that listed in individualized problem based charting.  Physical Exam:  Vitals:   05/24/22 1616  BP: (!) 148/87  Pulse: 86  Resp: (!) 32  Temp: 97.8 F (36.6 C)  TempSrc: Oral  SpO2: 100%  Weight: 204 lb 1.6 oz (92.6 kg)  Height: 5\' 2"  (1.575 m)   Physical Exam Constitutional:      Appearance: She is obese. She is not ill-appearing.  HENT:     Mouth/Throat:     Mouth: Mucous membranes are moist.     Pharynx: Oropharynx is clear.  Eyes:     Extraocular Movements: Extraocular movements intact.     Conjunctiva/sclera: Conjunctivae normal.     Pupils: Pupils are equal, round, and reactive to light.  Cardiovascular:     Rate and Rhythm: Normal rate and regular rhythm.     Pulses: Normal pulses.     Heart sounds: Normal heart sounds. No murmur heard.    No gallop.  Pulmonary:     Effort: Pulmonary effort is normal.     Breath sounds: Normal breath sounds. No wheezing, rhonchi or rales.  Abdominal:     General: Bowel sounds are normal. There is no distension.     Palpations: Abdomen is soft.     Tenderness: There is no abdominal tenderness.  Musculoskeletal:        General: No swelling. Normal range of motion.  Skin:    General: Skin is warm and dry.  Neurological:     General: No focal deficit present.     Mental Status: She is alert and oriented to person, place, and time.  Psychiatric:        Mood and Affect: Mood normal.        Behavior: Behavior normal.      Assessment & Plan:   See Encounters Tab for problem based charting.  Patient discussed with Dr.  

## 2022-05-24 NOTE — Assessment & Plan Note (Signed)
Patient with history of HLD, currently taking crestor 10mg  daily. Last lipid panel checked over a year ago. Will repeat today. Goal LDL <100 for primary prevention.

## 2022-05-24 NOTE — Assessment & Plan Note (Signed)
Patient with prediabetes, last A1c checked in 03/2021. Will obtain repeat A1c today to evaluate for progression of disease.

## 2022-05-24 NOTE — Assessment & Plan Note (Signed)
Today's Vitals   05/24/22 1616 05/24/22 1618  BP: (!) 148/87   Pulse: 86   Resp: (!) 32   Temp: 97.8 F (36.6 C)   TempSrc: Oral   SpO2: 100%   Weight: 204 lb 1.6 oz (92.6 kg)   Height: 5\' 2"  (1.575 m)   PainSc:  6    Body mass index is 37.33 kg/m.  Patient with history of HTN, currently taking olmesartan-HCTZ 40-12.5mg  daily. Blood pressure slightly above goal of <140/90 for primary prevention. She has decreased her salt intake and notes that she has tried to incorporate moderate intensity exercise into her lifestyle although endorses that she needs to be more consistent with this. Discussed importance of more optimal blood pressure control and she is in agreement with restarting amlodipine which she used to be on in the past.   Plan: -continue olmesartan-hctz -start amlodipine 5mg  daily -continue home BP log and bring to next visit in 3 weeks

## 2022-05-24 NOTE — Assessment & Plan Note (Signed)
Patient refused PAP smear today, but notes that she will have it performed at next visit in 3 weeks. Counseled on importance of cervical cancer screening and she understands.

## 2022-05-25 ENCOUNTER — Other Ambulatory Visit (HOSPITAL_COMMUNITY): Payer: Self-pay

## 2022-05-25 NOTE — Addendum Note (Signed)
Addended by: Charise Killian on: 05/25/2022 02:34 PM   Modules accepted: Level of Service

## 2022-05-25 NOTE — Progress Notes (Signed)
Internal Medicine Clinic Attending  Case discussed with Dr. Jinwala  At the time of the visit.  We reviewed the resident's history and exam and pertinent patient test results.  I agree with the assessment, diagnosis, and plan of care documented in the resident's note.  

## 2022-05-26 LAB — LIPID PANEL
Chol/HDL Ratio: 3.8 ratio (ref 0.0–4.4)
Cholesterol, Total: 190 mg/dL (ref 100–199)
HDL: 50 mg/dL (ref >39)
LDL Chol Calc (NIH): 103 mg/dL — ABNORMAL HIGH (ref 0–99)
Triglycerides: 214 mg/dL — ABNORMAL HIGH (ref 0–149)
VLDL Cholesterol Cal: 37 mg/dL (ref 5–40)

## 2022-05-26 LAB — HEMOGLOBIN A1C
Est. average glucose Bld gHb Est-mCnc: 114 mg/dL
Hgb A1c MFr Bld: 5.6 % (ref 4.8–5.6)

## 2022-06-20 ENCOUNTER — Other Ambulatory Visit: Payer: Self-pay | Admitting: Student

## 2022-06-20 ENCOUNTER — Other Ambulatory Visit (HOSPITAL_COMMUNITY): Payer: Self-pay

## 2022-06-21 MED ORDER — ESOMEPRAZOLE MAGNESIUM 20 MG PO CPDR
40.0000 mg | DELAYED_RELEASE_CAPSULE | Freq: Every evening | ORAL | 0 refills | Status: DC
  Filled 2022-06-21: qty 60, 30d supply, fill #0

## 2022-06-22 ENCOUNTER — Other Ambulatory Visit (HOSPITAL_COMMUNITY): Payer: Self-pay

## 2022-07-03 ENCOUNTER — Other Ambulatory Visit: Payer: Self-pay

## 2022-07-03 ENCOUNTER — Ambulatory Visit: Payer: Self-pay | Admitting: Student

## 2022-07-03 ENCOUNTER — Encounter: Payer: Self-pay | Admitting: Student

## 2022-07-03 VITALS — BP 153/84 | HR 77 | Temp 98.1°F | Ht 62.0 in | Wt 209.6 lb

## 2022-07-03 DIAGNOSIS — I10 Essential (primary) hypertension: Secondary | ICD-10-CM

## 2022-07-03 DIAGNOSIS — Z0000 Encounter for general adult medical examination without abnormal findings: Secondary | ICD-10-CM

## 2022-07-03 DIAGNOSIS — R21 Rash and other nonspecific skin eruption: Secondary | ICD-10-CM | POA: Insufficient documentation

## 2022-07-03 DIAGNOSIS — Z87891 Personal history of nicotine dependence: Secondary | ICD-10-CM

## 2022-07-03 NOTE — Progress Notes (Addendum)
CC: Leg Rash and follow-up HTN  HPI:  [name] is a 56 y.o. female living with a history stated below and presents today for acute leg rash and follow-up HTN. Please see problem based assessment and plan for additional details.  Past Medical History:  Diagnosis Date   GERD (gastroesophageal reflux disease)    Hypertension     Current Outpatient Medications on File Prior to Visit  Medication Sig Dispense Refill   amLODipine (NORVASC) 5 MG tablet Take 1 tablet (5 mg total) by mouth daily. 30 tablet 5   esomeprazole (NEXIUM) 20 MG capsule Take 2 capsules (40 mg total) by mouth every evening. 60 capsule 0   fluticasone (FLONASE) 50 MCG/ACT nasal spray Place 1 spray into both nostrils daily. 16 g 2   olmesartan-hydrochlorothiazide (BENICAR HCT) 40-12.5 MG tablet Take 1 tablet by mouth daily. 30 tablet 5   rosuvastatin (CRESTOR) 10 MG tablet Take 1 tablet (10 mg total) by mouth daily. 30 tablet 11   No current facility-administered medications on file prior to visit.    Review of Systems: ROS negative except for what is noted on the assessment and plan.  Vitals:   07/03/22 0937 07/03/22 1024  BP: (!) 147/84 (!) 153/84  Pulse: 80 77  Temp: 98.1 F (36.7 C)   TempSrc: Oral   SpO2: 100%   Weight: 209 lb 9.6 oz (95.1 kg)   Height: 5\' 2"  (1.575 m)     Physical Exam Constitutional:      General: She is not in acute distress.    Appearance: Normal appearance. She is not ill-appearing.  HENT:     Head: Normocephalic and atraumatic.  Cardiovascular:     Rate and Rhythm: Normal rate and regular rhythm.     Pulses:          Dorsalis pedis pulses are 2+ on the right side and 2+ on the left side.  Skin:    General: Skin is warm and dry.     Findings: Rash present.     Comments: Rash: fine red macular rash along feet, legs and abdominal, distinct edge along upper thighs. Non-tender, no swelling, warm and dry.   Neurological:     Mental Status: She is alert and oriented to  person, place, and time.  Psychiatric:        Mood and Affect: Mood normal.        Behavior: Behavior normal.      Assessment & Plan:   Rash Patient presents with a 1 day rash on her legs.  Started yesterday with fine red spots from her feet up to her thighs.  Rash is also present on her abdomen where her skin was exposed while wearing the bathing suit. Reports sun exposure 2 days ago. Applied lotion sunscreen, which patient notes may be a year old.  She was exposed to the sun for about 2 hours. Took benadryl with no change.  On exam, fine red rash from feet to upper thighs. Clear distinct edge along both thighs where her bathing suit ends. Abdominal exam showed similar rash on exposed skin of her bathing suit. No swelling, non-tender. Dorsalis pedis 2+ bilaterally.   Denies pruritus, swelling, pain/tenderness. Denies anaphylactic symptoms.  Plan -Patient will monitor, if the rash does not improve in the next week or worsens and develops pain then contact the clinic for further evaluation -Advised patient to stay hydrated and apply aloe or lotion if this is from sun exposure  ADDENDUM 7/21:  Called pt for f/u on rash. Pt reports leg rash is less red. Abdominal rash improved with size and color. No new symptoms. She is staying indoors and applying new sunscreen when outside. Advised pt if the rash worsens or develops pain or tenderness to contact the clinic for earlier f/u visit.    HTN (hypertension) Today's Vitals   07/03/22 0937 07/03/22 1024  BP: (!) 147/84 (!) 153/84  Pulse: 80 77  Temp: 98.1 F (36.7 C)   TempSrc: Oral   SpO2: 100%   Weight: 209 lb 9.6 oz (95.1 kg)   Height: 5\' 2"  (1.575 m)    Body mass index is 38.34 kg/m.   BP is elevated today on repeat.  She is on olmesartan-HCTZ 40-12.5 mg daily and amlodipine 5 mg daily.  She brought her BP log in today, reports 1 episode where BP was 86/63 and had lightheadedness and dizziness.  Does not recall any inciting events.   Otherwise her documented BP has been in target range with a few instances above.  Plan -Continue olmesartan-HCTZ and amlodipine -Continue home BP log, advised patient to check and document BP when having hypotensive symptoms -f/u in 3 months to re-evaluate if medication adjustment is needed  Routine adult health maintenance Patient has mammogram ordered but did not hear back so she will try to contact them to re-schedule it.   Patient would like to defer Pap Smear today since she is concerned about the acute rash. Plan to complete it at next visit.    Patient seen with Dr. , D.O. [facility], PGY-1 Phone: [phone] Date 07/06/2022 Time 8:54 AM

## 2022-07-03 NOTE — Assessment & Plan Note (Signed)
Today's Vitals   07/03/22 0937 07/03/22 1024  BP: (!) 147/84 (!) 153/84  Pulse: 80 77  Temp: 98.1 F (36.7 C)   TempSrc: Oral   SpO2: 100%   Weight: 209 lb 9.6 oz (95.1 kg)   Height: 5\' 2"  (1.575 m)    Body mass index is 38.34 kg/m.   BP is elevated today on repeat.  She is on olmesartan-HCTZ 40-12.5 mg daily and amlodipine 5 mg daily.  She brought her BP log in today, reports 1 episode where BP was 86/63 and had lightheadedness and dizziness.  Does not recall any inciting events.  Otherwise her documented BP has been in target range with a few instances above.  Plan -Continue olmesartan-HCTZ and amlodipine -Continue home BP log, advised patient to check and document BP when having hypotensive symptoms -f/u in 3 months to re-evaluate if medication adjustment is needed

## 2022-07-03 NOTE — Patient Instructions (Addendum)
Thank you, Ms.Oren Bracket for allowing [place] to provide your care today. Today we discussed blood pressure follow-up and leg rash.   Blood Pressure: -continue olmesartan-HCTZ and amlodipine -continue checking blood pressure and document when you have symptoms of dizziness or lightheadedness  Leg Rash -appears to be reaction to the sun given the distribution on legs and stomach -monitor for now but if it does not improve and worsens then contact the clinic     Health Maintenance  -reach back about Mammogram -Pap Smear at next visit  I have ordered the following labs for you:  Lab Orders  No laboratory test(s) ordered today     Referrals ordered today:   Referral Orders  No referral(s) requested today     I have ordered the following medication/changed the following medications:   Stop the following medications: There are no discontinued medications.   Start the following medications: No orders of the defined types were placed in this encounter.    Follow up: 3 months    Should you have any questions or concerns please call the internal medicine clinic at [phone].    Rana Snare, D.O. [facility]

## 2022-07-03 NOTE — Assessment & Plan Note (Addendum)
Patient presents with a 1 day rash on her legs.  Started yesterday with fine red spots from her feet up to her thighs.  Rash is also present on her abdomen where her skin was exposed while wearing the bathing suit. Reports sun exposure 2 days ago. Applied lotion sunscreen, which patient notes may be a year old.  She was exposed to the sun for about 2 hours. Took benadryl with no change.  On exam, fine red rash from feet to upper thighs. Clear distinct edge along both thighs where her bathing suit ends. Abdominal exam showed similar rash on exposed skin of her bathing suit. No swelling, non-tender. Dorsalis pedis 2+ bilaterally.   Denies pruritus, swelling, pain/tenderness. Denies anaphylactic symptoms.  Plan -Patient will monitor, if the rash does not improve in the next week or worsens and develops pain then contact the clinic for further evaluation -Advised patient to stay hydrated and apply aloe or lotion if this is from sun exposure  ADDENDUM 7/21: Called pt for f/u on rash. Pt reports leg rash is less red. Abdominal rash improved with size and color. No new symptoms. She is staying indoors and applying new sunscreen when outside. Advised pt if the rash worsens or develops pain or tenderness to contact the clinic for earlier f/u visit.

## 2022-07-03 NOTE — Assessment & Plan Note (Addendum)
Patient has mammogram ordered but did not hear back so she will try to contact them to re-schedule it.   Patient would like to defer Pap Smear today since she is concerned about the acute rash. Plan to complete it at next visit.

## 2022-07-05 NOTE — Progress Notes (Signed)
Internal Medicine Clinic Attending  I saw and evaluated the patient.  I personally confirmed the key portions of the history and exam documented by Dr. Sherrilee Gilles and I reviewed pertinent patient test results.  The assessment, diagnosis, and plan were formulated together and I agree with the documentation in the resident's note. Erythematous, macular rash over bilateral lower extremities with sharp demarcation at the location of her swimsuit edge. Nonpainful, nonpruritic. [name] appears clinically well. We discussed given the distribution that the rash appears most consistent with sunburn. Less likely PMLE due to absence of  pruritus. Possible phototoxic/photoallergic rxn with use of thiazide, hesitant to change medication given appropriate BP control and more likely typical sunburn--we have discussed sun protective measures and return to clinic if no improvement in several days.

## 2022-07-20 ENCOUNTER — Other Ambulatory Visit: Payer: Self-pay | Admitting: Student

## 2022-07-20 ENCOUNTER — Other Ambulatory Visit (HOSPITAL_COMMUNITY): Payer: Self-pay

## 2022-07-20 MED ORDER — ESOMEPRAZOLE MAGNESIUM 20 MG PO CPDR
40.0000 mg | DELAYED_RELEASE_CAPSULE | Freq: Every evening | ORAL | 0 refills | Status: DC
Start: 2022-07-20 — End: 2022-08-18
  Filled 2022-07-20: qty 60, 30d supply, fill #0

## 2022-08-18 ENCOUNTER — Other Ambulatory Visit: Payer: Self-pay | Admitting: Internal Medicine

## 2022-08-21 ENCOUNTER — Other Ambulatory Visit (HOSPITAL_COMMUNITY): Payer: Self-pay

## 2022-08-21 MED ORDER — ESOMEPRAZOLE MAGNESIUM 40 MG PO CPDR
40.0000 mg | DELAYED_RELEASE_CAPSULE | Freq: Every evening | ORAL | 0 refills | Status: DC
  Filled 2022-08-21: qty 30, 30d supply, fill #0

## 2022-09-14 ENCOUNTER — Other Ambulatory Visit: Payer: Self-pay | Admitting: Internal Medicine

## 2022-09-14 ENCOUNTER — Other Ambulatory Visit (HOSPITAL_COMMUNITY): Payer: Self-pay

## 2022-09-14 MED ORDER — ESOMEPRAZOLE MAGNESIUM 40 MG PO CPDR
40.0000 mg | DELAYED_RELEASE_CAPSULE | Freq: Every evening | ORAL | 0 refills | Status: DC
  Filled 2022-09-14: qty 30, 30d supply, fill #0

## 2022-09-14 NOTE — Telephone Encounter (Signed)
I refilled her prescription for esomeprazole.

## 2022-10-15 ENCOUNTER — Other Ambulatory Visit (HOSPITAL_COMMUNITY): Payer: Self-pay

## 2022-10-23 ENCOUNTER — Ambulatory Visit (HOSPITAL_COMMUNITY)
Admission: RE | Admit: 2022-10-23 | Discharge: 2022-10-23 | Disposition: A | Discharge status: Home / Self Care | Payer: Self-pay | Source: Ambulatory Visit | Attending: Internal Medicine | Admitting: Internal Medicine

## 2022-10-23 ENCOUNTER — Other Ambulatory Visit (HOSPITAL_COMMUNITY)
Admission: RE | Admit: 2022-10-23 | Discharge: 2022-10-23 | Disposition: A | Discharge status: Home / Self Care | Payer: Self-pay | Source: Ambulatory Visit | Attending: Internal Medicine | Admitting: Internal Medicine

## 2022-10-23 ENCOUNTER — Ambulatory Visit: Payer: Self-pay | Admitting: Student

## 2022-10-23 ENCOUNTER — Other Ambulatory Visit: Payer: Self-pay

## 2022-10-23 ENCOUNTER — Encounter: Payer: Self-pay | Admitting: Student

## 2022-10-23 ENCOUNTER — Other Ambulatory Visit (HOSPITAL_COMMUNITY): Payer: Self-pay

## 2022-10-23 VITALS — BP 145/88 | HR 85 | Temp 98.2°F | Ht 61.0 in | Wt 212.0 lb

## 2022-10-23 DIAGNOSIS — M25511 Pain in right shoulder: Secondary | ICD-10-CM

## 2022-10-23 DIAGNOSIS — Z0000 Encounter for general adult medical examination without abnormal findings: Secondary | ICD-10-CM

## 2022-10-23 DIAGNOSIS — I10 Essential (primary) hypertension: Secondary | ICD-10-CM

## 2022-10-23 DIAGNOSIS — Z87891 Personal history of nicotine dependence: Secondary | ICD-10-CM

## 2022-10-23 MED ORDER — AMLODIPINE BESYLATE 10 MG PO TABS
10.0000 mg | ORAL_TABLET | Freq: Every day | ORAL | 11 refills | Status: DC
  Filled 2022-10-23: qty 30, 30d supply, fill #0
  Filled 2022-11-19: qty 30, 30d supply, fill #1
  Filled 2022-12-21: qty 30, 30d supply, fill #2
  Filled 2023-01-18: qty 30, 30d supply, fill #3
  Filled 2023-02-14: qty 30, 30d supply, fill #4
  Filled 2023-03-11: qty 30, 30d supply, fill #5
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
## 2022-10-23 NOTE — Patient Instructions (Addendum)
Thank you, Ms.Bubba Hales for allowing [place] to provide your care today.   Blood Pressure -Blood pressure elevated today. Please make sure to take your medications daily.  -Please continue your olmesartan-HCTZ 40-12.5 mg daily. -Increase amlodipine to 10 mg daily. Prescription sent to your pharmacy.  Right shoulder pain -You may take Tylenol for the pain. -X-ray of right shoulder today on first floor radiology department.   -Pap smear done today. I will call with results. -Blood work today to check electrolytes and kidney function.  I have ordered the following labs for you:  Lab Orders         BMP8+Anion Gap       I have ordered the following medication/changed the following medications:   Stop the following medications: Medications Discontinued During This Encounter  Medication Reason   amLODipine (NORVASC) 5 MG tablet      Start the following medications: Meds ordered this encounter  Medications   amLODipine (NORVASC) 10 MG tablet    Sig: Take 1 tablet (10 mg total) by mouth daily.    Dispense:  30 tablet    Refill:  11    IM program     Follow up:  1-2 weeks    Remember: Please bring blood pressure log to your next visit.   Should you have any questions or concerns please call the internal medicine clinic at [phone].    Angelique Blonder, D.O. [facility]
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
## 2022-10-23 NOTE — Assessment & Plan Note (Signed)
BP: (!) 145/88   BP remains elevated today on repeat.  She is on olmesartan-HCTZ 40-12.5 mg daily and amlodipine 5 mg daily.  She reports missing a few doses in the past 1 to 2 weeks.  Normally she has good adherence.  BP home readings ranges from 130-140/80.  She is asymptomatic today.  No recent episodes of lightheadedness or dizziness.  Plan -Continue olmesartan-HCTZ 40-12.5 mg daily -Increase amlodipine to 10 mg daily -BMP today

## 2022-10-23 NOTE — Assessment & Plan Note (Addendum)
Pap smear done today with chaperone present. Last pap smear in 2019 which was negative. No abnormal findings on exam. Patient denies any vaginal or urinary symptoms. Cytology sent today.

## 2022-10-24 LAB — BMP8+ANION GAP
Anion Gap: 15 mmol/L (ref 10.0–18.0)
BUN/Creatinine Ratio: 14 (ref 9–23)
BUN: 12 mg/dL (ref 6–24)
CO2: 25 mmol/L (ref 20–29)
Calcium: 9.6 mg/dL (ref 8.7–10.2)
Chloride: 104 mmol/L (ref 96–106)
Creatinine, Ser: 0.84 mg/dL (ref 0.57–1.00)
Glucose: 98 mg/dL (ref 70–99)
Potassium: 3.7 mmol/L (ref 3.5–5.2)
Sodium: 144 mmol/L (ref 134–144)
eGFR: 82 mL/min/{1.73_m2} (ref >59)

## 2022-10-25 LAB — CYTOLOGY - PAP
Comment: NEGATIVE
Diagnosis: NEGATIVE
High risk HPV: NEGATIVE

## 2022-10-25 NOTE — Progress Notes (Signed)
Internal Medicine Clinic Attending  I saw and evaluated the patient.  I personally confirmed the key portions of the history and exam documented by Dr. Zheng and I reviewed pertinent patient test results.  The assessment, diagnosis, and plan were formulated together and I agree with the documentation in the resident's note.  

## 2022-11-02 ENCOUNTER — Encounter: Payer: Self-pay | Admitting: Student

## 2022-11-19 ENCOUNTER — Other Ambulatory Visit (HOSPITAL_COMMUNITY): Payer: Self-pay

## 2022-12-20 ENCOUNTER — Ambulatory Visit: Payer: Self-pay | Admitting: Student

## 2022-12-20 ENCOUNTER — Ambulatory Visit (HOSPITAL_COMMUNITY)
Admission: RE | Admit: 2022-12-20 | Disposition: A | Discharge status: Home / Self Care | Payer: Self-pay | Source: Ambulatory Visit

## 2022-12-20 ENCOUNTER — Other Ambulatory Visit (HOSPITAL_COMMUNITY): Payer: Self-pay

## 2022-12-20 VITALS — BP 141/85 | Temp 99.8°F | Ht 61.0 in | Wt 212.0 lb

## 2022-12-20 DIAGNOSIS — I10 Essential (primary) hypertension: Secondary | ICD-10-CM

## 2022-12-20 DIAGNOSIS — R079 Chest pain, unspecified: Secondary | ICD-10-CM | POA: Insufficient documentation

## 2022-12-20 DIAGNOSIS — I739 Peripheral vascular disease, unspecified: Secondary | ICD-10-CM

## 2022-12-20 DIAGNOSIS — Z87891 Personal history of nicotine dependence: Secondary | ICD-10-CM

## 2022-12-20 DIAGNOSIS — E782 Mixed hyperlipidemia: Secondary | ICD-10-CM

## 2022-12-20 MED ORDER — ROSUVASTATIN CALCIUM 20 MG PO TABS: 20.0000 mg | ORAL_TABLET | Freq: Every day | ORAL | 11 refills | Status: DC

## 2022-12-20 MED ORDER — ROSUVASTATIN CALCIUM 20 MG PO TABS
20.0000 mg | ORAL_TABLET | Freq: Every day | ORAL | 11 refills | Status: DC
  Filled 2022-12-20: qty 30, 30d supply, fill #0
  Filled 2023-01-18: qty 30, 30d supply, fill #1
  Filled 2023-02-14: qty 30, 30d supply, fill #2
  Filled 2023-03-11: qty 30, 30d supply, fill #3

## 2022-12-20 NOTE — Assessment & Plan Note (Signed)
This 57 year old patient with extensive family history of early heart disease and ASCVD risk factors including high cholesterol, high blood pressure, former smoking, presents with several weeks to months of chest pain that is fairly atypical for angina.  She is overall well-appearing and physical exam is reassuring.  An EKG in the clinic shows a normal sinus rhythm without any acute ST segment changes.  Although the patient does occasionally have pain while walking, it does not seem to be exacerbated by exertion based on her history.  My impression is for noncardiac chest pain, perhaps pleuritic or musculoskeletal.  However my index of suspicion is high enough that I recommend this patient undergo stress testing.  Because of her uninsured status she would prefer to defer, which I think is reasonable at this time.  She was advised to call the clinic if the pain worsens.

## 2022-12-20 NOTE — Patient Instructions (Signed)
Today we discussed chest pain, high blood pressure, and high cholesterol.  Return in 3 months for high blood pressure, hyperlipidemia, chest pain.   Please call our clinic at [phone] Monday through Friday from 9 am to 4 pm if you have questions or concerns about your health. If after hours or on the weekend, call the main hospital number and ask for the Internal Medicine Resident On-Call. If you need medication refills, please notify your pharmacy one week in advance and they will send [place] a request.   Best, Nani Gasser, [place]

## 2022-12-20 NOTE — Assessment & Plan Note (Signed)
Increase rosuvastatin to 20 mg daily given high risk of ASCVD events.

## 2022-12-20 NOTE — Progress Notes (Signed)
Subjective:  [name] is a 57 y.o. who presents to clinic for concern of chest pain and high blood pressure.  Chest pain - ongoing couple of months - comes and goes - getting more frequent - feels like someone pressing on her chest - inhalers sometimes helps, aspirin helped last night - worse when she's anxious - sometimes worse when she walks, but not all the time - occasionally feels it at rest  Review of Systems  Cardiovascular:  Positive for orthopnea and PND. Negative for palpitations.   Family history notable for several family members with early heart disease.  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   Anemia 12/04/2017   Left hip pain 03/01/2015   COPD (chronic obstructive pulmonary disease) ([place]) 01/19/2014   Hyperlipidemia 05/27/2013   Obesity, Class III, BMI 40-49.9 (morbid obesity) ([place]) 05/27/2013   Ischemic colitis ([place]) 11/20/2012   HTN (hypertension) 12/21/2011   Dyspnea on exertion 12/21/2011   Routine adult health maintenance 12/21/2011   GERD (gastroesophageal reflux disease) 12/21/2011   Objective:   Vitals:   12/20/22 1520  BP: (!) 141/85  Temp: 99.8 F (37.7 C)  TempSrc: Oral  SpO2: 100%  Weight: 212 lb (96.2 kg)  Height: 5\' 1"  (1.549 m)    Physical Exam Constitutional:      General: She is not in acute distress.    Appearance: Normal appearance.  HENT:     Mouth/Throat:     Mouth: Mucous membranes are moist.  Eyes:     Conjunctiva/sclera: Conjunctivae normal.  Cardiovascular:     Rate and Rhythm: Normal rate and regular rhythm.     Pulses: Normal pulses.  Pulmonary:     Effort: Pulmonary effort is normal.     Breath sounds: Normal breath sounds. No stridor.  Abdominal:     Palpations:  Abdomen is soft.     Tenderness: There is no abdominal tenderness.  Musculoskeletal:        General: No tenderness (No chest tenderness).     Right lower leg: No edema.     Left lower leg: No edema.  Lymphadenopathy:     Cervical: No cervical adenopathy.  Skin:    General: Skin is warm and dry.  Neurological:     Mental Status: She is alert. Mental status is at baseline.  Psychiatric:        Mood and Affect: Mood normal.        Behavior: Behavior normal.     Assessment & Plan:  The primary encounter diagnosis was Chest pain, unspecified type. Diagnoses of PAD (peripheral artery disease) ([place]), Moderate mixed hyperlipidemia not requiring statin therapy, and Primary hypertension were also pertinent to this visit.  Chest pain This 57 year old patient with extensive family history of early heart disease and ASCVD risk factors including high cholesterol, high blood pressure, former smoking, presents with several weeks to months of chest pain that is fairly atypical for angina.  She is overall well-appearing and physical exam is reassuring.  An EKG in the clinic shows a normal sinus rhythm without any acute ST segment changes.  Although the patient does occasionally have pain while walking, it does not seem to be exacerbated by exertion based on her history.  My impression  is for noncardiac chest pain, perhaps pleuritic or musculoskeletal.  However my index of suspicion is high enough that I recommend this patient undergo stress testing.  Because of her uninsured status she would prefer to defer, which I think is reasonable at this time.  She was advised to call the clinic if the pain worsens.  HTN (hypertension) Blood pressure elevated in clinic today.  She states that she skipped her evening dose of antihypertensives because she was concerned about low blood pressure, having measured a reading of approximately 110/60 before bed.  I advised her to continue taking her blood pressure medicine  given her high risk for ASCVD.  Hyperlipidemia Increase rosuvastatin to 20 mg daily given high risk of ASCVD events.    Return in 3 months for high blood pressure, hyperlipidemia, chest pain.  Patient discussed with Dr. Luna Kitchens MD 12/20/2022, 11:06 PM  Pager: [phone]

## 2022-12-20 NOTE — Assessment & Plan Note (Signed)
Blood pressure elevated in clinic today.  She states that she skipped her evening dose of antihypertensives because she was concerned about low blood pressure, having measured a reading of approximately 110/60 before bed.  I advised her to continue taking her blood pressure medicine given her high risk for ASCVD.

## 2022-12-21 ENCOUNTER — Other Ambulatory Visit (HOSPITAL_COMMUNITY): Payer: Self-pay

## 2022-12-21 ENCOUNTER — Other Ambulatory Visit: Payer: Self-pay | Admitting: Student

## 2022-12-21 MED ORDER — OLMESARTAN MEDOXOMIL-HCTZ 40-12.5 MG PO TABS
1.0000 | ORAL_TABLET | Freq: Every day | ORAL | 5 refills | Status: DC
  Filled 2022-12-21 – 2023-01-11 (×2): qty 30, 30d supply, fill #0
  Filled 2023-02-07: qty 30, 30d supply, fill #1
  Filled 2023-03-11: qty 30, 30d supply, fill #2

## 2022-12-24 NOTE — Progress Notes (Signed)
Internal Medicine Clinic Attending  Case discussed with Dr. McLendon  At the time of the visit.  We reviewed the resident's history and exam and pertinent patient test results.  I agree with the assessment, diagnosis, and plan of care documented in the resident's note.  

## 2023-01-04 ENCOUNTER — Other Ambulatory Visit (HOSPITAL_COMMUNITY): Payer: Self-pay

## 2023-01-11 ENCOUNTER — Other Ambulatory Visit (HOSPITAL_COMMUNITY): Payer: Self-pay

## 2023-01-21 ENCOUNTER — Other Ambulatory Visit: Payer: Self-pay

## 2023-02-08 ENCOUNTER — Other Ambulatory Visit (HOSPITAL_COMMUNITY): Payer: Self-pay

## 2023-02-14 ENCOUNTER — Other Ambulatory Visit: Payer: Self-pay

## 2023-03-11 ENCOUNTER — Other Ambulatory Visit (HOSPITAL_COMMUNITY): Payer: Self-pay

## 2023-03-12 ENCOUNTER — Telehealth: Payer: Self-pay

## 2023-03-12 ENCOUNTER — Other Ambulatory Visit (HOSPITAL_COMMUNITY): Payer: Self-pay

## 2023-03-12 NOTE — Telephone Encounter (Signed)
Patient called she stated she went to the pharmacy to pick up her medications (Amlodipine,Omlesartan,Rosuvastatin and the total price came up to over $600 patient stated her medications are usually under $4 program please return patients call.

## 2023-03-13 ENCOUNTER — Other Ambulatory Visit (HOSPITAL_COMMUNITY): Payer: Self-pay

## 2023-03-13 NOTE — Telephone Encounter (Signed)
All meds were sent under IM Program. Spoke with [name] at [facility] who stated patient p/u all 3 meds yesterday, and each was $4

## 2023-03-25 ENCOUNTER — Encounter: Payer: Self-pay | Admitting: Student

## 2023-04-04 ENCOUNTER — Encounter: Payer: Self-pay | Admitting: Student

## 2023-04-04 ENCOUNTER — Other Ambulatory Visit (HOSPITAL_COMMUNITY): Payer: Self-pay

## 2023-04-04 ENCOUNTER — Ambulatory Visit: Payer: Self-pay | Admitting: Student

## 2023-04-04 VITALS — BP 115/71 | HR 69 | Temp 98.0°F | Ht 62.0 in | Wt 210.4 lb

## 2023-04-04 DIAGNOSIS — E785 Hyperlipidemia, unspecified: Secondary | ICD-10-CM

## 2023-04-04 DIAGNOSIS — E7800 Pure hypercholesterolemia, unspecified: Secondary | ICD-10-CM

## 2023-04-04 DIAGNOSIS — J42 Unspecified chronic bronchitis: Secondary | ICD-10-CM

## 2023-04-04 DIAGNOSIS — E66813 Obesity, class 3: Secondary | ICD-10-CM

## 2023-04-04 DIAGNOSIS — Z0000 Encounter for general adult medical examination without abnormal findings: Secondary | ICD-10-CM

## 2023-04-04 DIAGNOSIS — J449 Chronic obstructive pulmonary disease, unspecified: Secondary | ICD-10-CM

## 2023-04-04 DIAGNOSIS — Z6838 Body mass index (BMI) 38.0-38.9, adult: Secondary | ICD-10-CM

## 2023-04-04 DIAGNOSIS — R7303 Prediabetes: Secondary | ICD-10-CM

## 2023-04-04 DIAGNOSIS — K559 Vascular disorder of intestine, unspecified: Secondary | ICD-10-CM

## 2023-04-04 DIAGNOSIS — Z1211 Encounter for screening for malignant neoplasm of colon: Secondary | ICD-10-CM

## 2023-04-04 DIAGNOSIS — I10 Essential (primary) hypertension: Secondary | ICD-10-CM

## 2023-04-04 LAB — POCT GLYCOSYLATED HEMOGLOBIN (HGB A1C): Hemoglobin A1C: 5.8 % — AB (ref 4.0–5.6)

## 2023-04-04 LAB — GLUCOSE, CAPILLARY: Glucose-Capillary: 107 mg/dL — ABNORMAL HIGH (ref 70–99)

## 2023-04-04 MED ORDER — ROSUVASTATIN CALCIUM 20 MG PO TABS
20.0000 mg | ORAL_TABLET | Freq: Every day | ORAL | 11 refills | Status: DC
  Filled 2023-04-04: qty 30, 30d supply, fill #0
  Filled 2023-05-09: qty 30, 30d supply, fill #1
  Filled 2023-06-10: qty 30, 30d supply, fill #2
  Filled 2023-07-10: qty 30, 30d supply, fill #3
  Filled 2023-08-07: qty 30, 30d supply, fill #4
  Filled 2023-09-07: qty 30, 30d supply, fill #5
  Filled 2023-10-14: qty 30, 30d supply, fill #6
  Filled 2023-11-11: qty 30, 30d supply, fill #7
  Filled 2023-12-20: qty 30, 30d supply, fill #8
  Filled 2024-01-20: qty 30, 30d supply, fill #9
  Filled 2024-02-17: qty 30, 30d supply, fill #10
  Filled 2024-03-25: qty 30, 30d supply, fill #11

## 2023-04-04 MED ORDER — OLMESARTAN MEDOXOMIL-HCTZ 40-12.5 MG PO TABS
1.0000 | ORAL_TABLET | Freq: Every day | ORAL | 11 refills | Status: DC
  Filled 2023-04-04: qty 30, 30d supply, fill #0
  Filled 2023-05-09: qty 30, 30d supply, fill #1
  Filled 2023-06-10: qty 30, 30d supply, fill #2
  Filled 2023-07-10: qty 30, 30d supply, fill #3
  Filled 2023-08-07: qty 30, 30d supply, fill #4
  Filled 2023-09-07: qty 30, 30d supply, fill #5
  Filled 2023-10-14: qty 30, 30d supply, fill #6
  Filled 2023-11-11: qty 30, 30d supply, fill #7
  Filled 2023-12-20: qty 30, 30d supply, fill #8
  Filled 2024-01-20: qty 30, 30d supply, fill #9
  Filled 2024-02-17: qty 30, 30d supply, fill #10

## 2023-04-04 MED ORDER — AMLODIPINE BESYLATE 10 MG PO TABS
10.0000 mg | ORAL_TABLET | Freq: Every day | ORAL | 11 refills | Status: DC
  Filled 2023-04-04: qty 30, 30d supply, fill #0
  Filled 2023-05-09: qty 30, 30d supply, fill #1
  Filled 2023-06-10: qty 30, 30d supply, fill #2
  Filled 2023-07-10: qty 30, 30d supply, fill #3
  Filled 2023-08-07: qty 30, 30d supply, fill #4
  Filled 2023-09-07: qty 30, 30d supply, fill #5
  Filled 2023-10-14: qty 30, 30d supply, fill #6
  Filled 2023-11-11: qty 30, 30d supply, fill #7
  Filled 2023-12-20: qty 30, 30d supply, fill #8
  Filled 2024-01-20: qty 30, 30d supply, fill #9
  Filled 2024-02-17: qty 30, 30d supply, fill #10
  Filled 2024-03-25: qty 30, 30d supply, fill #11

## 2023-04-04 MED ORDER — FLUTICASONE PROPIONATE 50 MCG/ACT NA SUSP
1.0000 | Freq: Every day | NASAL | 2 refills | Status: DC
  Filled 2023-04-04: qty 16, 60d supply, fill #0

## 2023-04-04 NOTE — Assessment & Plan Note (Signed)
COPD in charting but no prior PFTs performed. She is not on any inhalers and denies any respiratory symptoms even with exertion. Will resolve this issue.

## 2023-04-04 NOTE — Progress Notes (Signed)
   CC: f/u prediabetes, HTN  HPI:  [name] is a 57 y.o. female with history listed below presenting to the [facility] for f/u prediabetes, HTN. Please see individualized problem based charting for full HPI.  Past Medical History:  Diagnosis Date   GERD (gastroesophageal reflux disease)    Hypertension     Review of Systems:  Negative aside from that listed in individualized problem based charting.  Physical Exam:  Vitals:   04/04/23 1101  BP: 115/71  Pulse: 69  Temp: 98 F (36.7 C)  TempSrc: Oral  SpO2: 100%  Weight: 210 lb 6.4 oz (95.4 kg)  Height:  (1.575 m)   Physical Exam Constitutional:      Appearance: Normal appearance. She is obese. She is not ill-appearing.  HENT:     Mouth/Throat:     Mouth: Mucous membranes are moist.     Pharynx: Oropharynx is clear. No oropharyngeal exudate.  Eyes:     General: No scleral icterus.    Extraocular Movements: Extraocular movements intact.     Conjunctiva/sclera: Conjunctivae normal.     Pupils: Pupils are equal, round, and reactive to light.  Cardiovascular:     Rate and Rhythm: Normal rate and regular rhythm.     Heart sounds: Normal heart sounds. No murmur heard.    No friction rub. No gallop.  Pulmonary:     Effort: Pulmonary effort is normal.     Breath sounds: Normal breath sounds. No wheezing, rhonchi or rales.  Abdominal:     General: Bowel sounds are normal. There is no distension.     Palpations: Abdomen is soft.     Tenderness: There is no abdominal tenderness. There is no guarding or rebound.  Musculoskeletal:        General: No swelling. Normal range of motion.  Skin:    General: Skin is warm and dry.  Neurological:     General: No focal deficit present.     Mental Status: She is alert and oriented to person, place, and time.  Psychiatric:        Mood and Affect: Mood normal.        Behavior: Behavior normal.      Assessment & Plan:   See Encounters Tab for problem based  charting.  Patient discussed with Dr.  Lafonda Mosses

## 2023-04-04 NOTE — Assessment & Plan Note (Signed)
Current regimen includes norvasc  daily, olmesartan-HCTZ 40-12.5mg  daily. BP 115/71 in clinic today, at goal.  -continue current regimen

## 2023-04-04 NOTE — Assessment & Plan Note (Signed)
A1c 5.8% today, remains prediabetic. She has been making lifestyle changes with exercise and carb counting along with her daughter. No indication of pharmacological management at this time.   -repeat A1c in 1 year

## 2023-04-04 NOTE — Assessment & Plan Note (Signed)
Uninsured and does not qualify for orange card due to husbands income. Provided pamphlet on mammogram as she is due for screening.

## 2023-04-04 NOTE — Patient Instructions (Signed)
[name],  It was a pleasure seeing you in the clinic today.   I have placed refills on your medications. Your A1c looks great, keep up the good work! We are checking your cholesterol level. I will call you when I have the results. I have placed a referral to the stomach doctors for your colonoscopy. They will contact you to schedule this. Please call the number provided to get your mammogram. Please come back in 6 months for your next visit.  Please call our clinic at [phone] if you have any questions or concerns. The best time to call is Monday-Friday from 9am-4pm, but there is someone available 24/7 at the same number. If you need medication refills, please notify your pharmacy one week in advance and they will send [place] a request.   Thank you for letting us take part in your care. We look forward to seeing you next time!

## 2023-04-04 NOTE — Assessment & Plan Note (Signed)
LDL 103 on lipid panel 1 year ago. She is currently on crestor  daily. Will repeat lipid panel today to reassess. If remains above goal, can consider increasing crestor to  daily.

## 2023-04-04 NOTE — Assessment & Plan Note (Signed)
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 She has not had any screening colonoscopies since that time. She is uninsured but is willing to obtain referral to GI for colonoscopy. No abdominal pain, melena, hematochezia, or other systemic symptoms per patient.  -placed referral to GI for colonoscopy

## 2023-04-05 LAB — LIPID PANEL
Chol/HDL Ratio: 3.1 ratio (ref 0.0–4.4)
Cholesterol, Total: 147 mg/dL (ref 100–199)
HDL: 48 mg/dL (ref >39)
LDL Chol Calc (NIH): 68 mg/dL (ref 0–99)
Triglycerides: 187 mg/dL — ABNORMAL HIGH (ref 0–149)
VLDL Cholesterol Cal: 31 mg/dL (ref 5–40)

## 2023-04-05 NOTE — Progress Notes (Signed)
Patient called.  Patient aware. LDL at goal on crestor  daily.

## 2023-04-05 NOTE — Progress Notes (Signed)
Internal Medicine Clinic Attending  Case discussed with Dr. Jinwala  At the time of the visit.  We reviewed the resident's history and exam and pertinent patient test results.  I agree with the assessment, diagnosis, and plan of care documented in the resident's note.  

## 2023-04-08 ENCOUNTER — Other Ambulatory Visit (HOSPITAL_COMMUNITY): Payer: Self-pay

## 2023-04-10 ENCOUNTER — Other Ambulatory Visit: Payer: Self-pay

## 2023-04-10 ENCOUNTER — Emergency Department (HOSPITAL_COMMUNITY)
Admission: EM | Admit: 2023-04-10 | Discharge: 2023-04-11 | Discharge status: Against Medical Advice | Payer: Self-pay | Attending: Emergency Medicine | Admitting: Emergency Medicine

## 2023-04-10 ENCOUNTER — Encounter (HOSPITAL_COMMUNITY): Payer: Self-pay | Admitting: Emergency Medicine

## 2023-04-10 DIAGNOSIS — Z5321 Procedure and treatment not carried out due to patient leaving prior to being seen by health care provider: Secondary | ICD-10-CM | POA: Insufficient documentation

## 2023-04-10 DIAGNOSIS — R55 Syncope and collapse: Secondary | ICD-10-CM | POA: Insufficient documentation

## 2023-04-10 LAB — CBC WITH DIFFERENTIAL/PLATELET
Abs Immature Granulocytes: 0.04 10*3/uL (ref 0.00–0.07)
Basophils Absolute: 0.1 10*3/uL (ref 0.0–0.1)
Basophils Relative: 1 %
Eosinophils Absolute: 0.2 10*3/uL (ref 0.0–0.5)
Eosinophils Relative: 2 %
HCT: 36.6 % (ref 36.0–46.0)
Hemoglobin: 12.2 g/dL (ref 12.0–15.0)
Immature Granulocytes: 1 %
Lymphocytes Relative: 21 %
Lymphs Abs: 1.8 10*3/uL (ref 0.7–4.0)
MCH: 30.7 pg (ref 26.0–34.0)
MCHC: 33.3 g/dL (ref 30.0–36.0)
MCV: 92 fL (ref 80.0–100.0)
Monocytes Absolute: 0.5 10*3/uL (ref 0.1–1.0)
Monocytes Relative: 6 %
Neutro Abs: 5.9 10*3/uL (ref 1.7–7.7)
Neutrophils Relative %: 69 %
Platelets: 246 10*3/uL (ref 150–400)
RBC: 3.98 MIL/uL (ref 3.87–5.11)
RDW: 12.4 % (ref 11.5–15.5)
WBC: 8.4 10*3/uL (ref 4.0–10.5)
nRBC: 0 % (ref 0.0–0.2)

## 2023-04-10 LAB — BASIC METABOLIC PANEL
Anion gap: 10 (ref 5–15)
BUN: 16 mg/dL (ref 6–20)
CO2: 27 mmol/L (ref 22–32)
Calcium: 9 mg/dL (ref 8.9–10.3)
Chloride: 101 mmol/L (ref 98–111)
Creatinine, Ser: 1.12 mg/dL — ABNORMAL HIGH (ref 0.44–1.00)
GFR, Estimated: 58 mL/min — ABNORMAL LOW (ref >60)
Glucose, Bld: 101 mg/dL — ABNORMAL HIGH (ref 70–99)
Potassium: 3.1 mmol/L — ABNORMAL LOW (ref 3.5–5.1)
Sodium: 138 mmol/L (ref 135–145)

## 2023-04-10 NOTE — ED Provider Triage Note (Signed)
Emergency Medicine Provider Triage Evaluation Note  [name] , a 57 y.o. female  was evaluated in triage.  Pt complains of lightheadedness/near syncopal episode.  She did donate blood yesterday.  Has not had adequate hydration.  Denies chest pain, or other complaints.  Review of Systems  Positive: As above Negative: As above  Physical Exam  BP 109/76 (BP Location: Right Arm)   Pulse 62   Temp 97.8 F (36.6 C) (Oral)   Resp 18   Ht  (1.575 m)   Wt 95.3 kg   SpO2 100%   BMI 38.41 kg/m  Gen:   Awake, no distress   Resp:  Normal effort  MSK:   Moves extremities without difficulty  Other:    Medical Decision Making  Medically screening exam initiated at 2:37 PM.  Appropriate orders placed.  CAYTLIN BETTER was informed that the remainder of the evaluation will be completed by another provider, this initial triage assessment does not replace that evaluation, and the importance of remaining in the ED until their evaluation is complete.     Marita [place], PA-C 04/10/23 1438

## 2023-04-10 NOTE — ED Triage Notes (Signed)
Patient arrives in wheelchair by POV states while she was working around the house and became light headed. Patient states she lowered herself down to the ground and called her spouse stating she was too weak to get up. Patient reports donating blood yesterday.

## 2023-04-25 ENCOUNTER — Telehealth: Payer: Self-pay | Admitting: *Deleted

## 2023-04-25 NOTE — Transitions of Care (Post Inpatient/ED Visit) (Signed)
   04/25/2023  Name: [name] MRN: [id number] DOB: Jul 01, 1966  Today's TOC FU Call Status: Today's TOC FU Call Status:: Unsuccessul Call (1st Attempt) Unsuccessful Call (1st Attempt) Date: 04/25/23  Attempted to reach the patient regarding the most recent Inpatient/ED visit.  Follow Up Plan: Additional outreach attempts will be made to reach the patient to complete the Transitions of Care (Post Inpatient/ED visit) call.   Signature Alaze Garverick,RN

## 2023-04-26 NOTE — Transitions of Care (Post Inpatient/ED Visit) (Signed)
   04/26/2023  Name: [name] MRN: [id number] DOB: 16-Jul-1966  Today's TOC FU Call Status: Today's TOC FU Call Status:: Unsuccessful Call (2nd Attempt) Unsuccessful Call (1st Attempt) Date: 04/25/23 Unsuccessful Call (2nd Attempt) Date: 04/26/23  Attempted to reach the patient regarding the most recent Inpatient/ED visit.  Follow Up Plan: No further outreach attempts will be made at this time. We have been unable to contact the patient.  Signature Waldine Zenz,RN

## 2023-05-09 ENCOUNTER — Other Ambulatory Visit (HOSPITAL_COMMUNITY): Payer: Self-pay

## 2023-05-20 ENCOUNTER — Encounter: Payer: Self-pay | Admitting: *Deleted

## 2023-06-10 ENCOUNTER — Other Ambulatory Visit (HOSPITAL_COMMUNITY): Payer: Self-pay

## 2023-06-13 ENCOUNTER — Other Ambulatory Visit (HOSPITAL_COMMUNITY): Payer: Self-pay

## 2023-07-10 ENCOUNTER — Other Ambulatory Visit (HOSPITAL_COMMUNITY): Payer: Self-pay

## 2023-07-11 ENCOUNTER — Other Ambulatory Visit (HOSPITAL_COMMUNITY): Payer: Self-pay

## 2023-08-07 ENCOUNTER — Other Ambulatory Visit (HOSPITAL_COMMUNITY): Payer: Self-pay

## 2023-08-07 ENCOUNTER — Other Ambulatory Visit: Payer: Self-pay

## 2023-08-08 ENCOUNTER — Other Ambulatory Visit (HOSPITAL_COMMUNITY): Payer: Self-pay

## 2023-08-09 ENCOUNTER — Other Ambulatory Visit: Payer: Self-pay

## 2023-09-09 ENCOUNTER — Other Ambulatory Visit (HOSPITAL_COMMUNITY): Payer: Self-pay

## 2023-09-10 ENCOUNTER — Other Ambulatory Visit (HOSPITAL_COMMUNITY): Payer: Self-pay

## 2023-10-14 ENCOUNTER — Other Ambulatory Visit (HOSPITAL_COMMUNITY): Payer: Self-pay

## 2023-11-11 ENCOUNTER — Other Ambulatory Visit (HOSPITAL_COMMUNITY): Payer: Self-pay

## 2023-12-20 ENCOUNTER — Other Ambulatory Visit (HOSPITAL_COMMUNITY): Payer: Self-pay

## 2024-01-20 ENCOUNTER — Other Ambulatory Visit (HOSPITAL_COMMUNITY): Payer: Self-pay

## 2024-01-22 ENCOUNTER — Other Ambulatory Visit (HOSPITAL_COMMUNITY): Payer: Self-pay

## 2024-02-03 ENCOUNTER — Telehealth: Payer: Self-pay | Admitting: *Deleted

## 2024-02-03 NOTE — Telephone Encounter (Signed)
 Call from patient has been awakening in the mornings for 3 weeks with blood on her tongue.  States has bad acid reflux and is currently on a medication for.  Patient is requesting an appointment in the Clinics.   Patient was offered option of going to an Urgent Care as she stated that she did not want to go to the ER.  Patient was scheduled for an appointment on 02/05/2024.

## 2024-02-05 ENCOUNTER — Other Ambulatory Visit (HOSPITAL_COMMUNITY): Payer: Self-pay

## 2024-02-05 ENCOUNTER — Ambulatory Visit: Payer: Self-pay | Admitting: Student

## 2024-02-05 ENCOUNTER — Encounter: Payer: Self-pay | Admitting: Student

## 2024-02-05 VITALS — BP 120/70 | Temp 98.1°F | Ht 62.0 in | Wt 222.1 lb

## 2024-02-05 DIAGNOSIS — Z1239 Encounter for other screening for malignant neoplasm of breast: Secondary | ICD-10-CM

## 2024-02-05 DIAGNOSIS — R1032 Left lower quadrant pain: Secondary | ICD-10-CM | POA: Insufficient documentation

## 2024-02-05 DIAGNOSIS — R109 Unspecified abdominal pain: Secondary | ICD-10-CM | POA: Diagnosis not present

## 2024-02-05 DIAGNOSIS — R82998 Other abnormal findings in urine: Secondary | ICD-10-CM | POA: Diagnosis not present

## 2024-02-05 DIAGNOSIS — R112 Nausea with vomiting, unspecified: Secondary | ICD-10-CM | POA: Diagnosis not present

## 2024-02-05 LAB — POCT URINALYSIS DIPSTICK
Blood, UA: NEGATIVE
Glucose, UA: NEGATIVE
Ketones, UA: NEGATIVE
Nitrite, UA: NEGATIVE
Protein, UA: POSITIVE — AB
Spec Grav, UA: 1.025 (ref 1.010–1.025)
Urobilinogen, UA: 4 U/dL — AB
pH, UA: 6.5 (ref 5.0–8.0)

## 2024-02-05 LAB — GLUCOSE, CAPILLARY: Glucose-Capillary: 102 mg/dL — ABNORMAL HIGH (ref 70–99)

## 2024-02-05 MED ORDER — ONDANSETRON HCL 4 MG PO TABS
4.0000 mg | ORAL_TABLET | Freq: Every day | ORAL | 0 refills | Status: DC | PRN
  Filled 2024-02-05: qty 30, 30d supply, fill #0

## 2024-02-05 NOTE — Progress Notes (Signed)
    CC: Acute Concern of LLQ abdominal pain , nausea/vomiting  HPI:  [name] is a 58 y.o. female with pertinent PMH of GERD, HTN who presents to the clinic with concerns of persistent acid reflux. Please see assessment and plan below for further details.  Review of Systems:   Pertinent items noted in HPI and/or A&P.  Physical Exam:  Vitals:   02/05/24 0843  BP: 120/70  Temp: 98.1 F (36.7 C)  TempSrc: Oral  Weight: 222 lb 1.6 oz (100.7 kg)  Height: 5\' 2"  (1.575 m)   Well-appearing woman, no acute distress Heart rate and rhythm regular, no murmurs Lungs clear to auscultation bilaterally, normal respiratory rate and effort Abdomen soft, focal tenderness in the left lower quadrant with deep palpation, no hepatosplenomegaly appreciated, no distention No focal neurological deficits Small area of dermatitis over central abdomen  Assessment & Plan:   Nausea and vomiting She came to the clinic for an acute visit for nausea and vomiting for the past 3 weeks.  She states that she had some sort of GI bug at the end of January and since then has been unable to tolerate food due to persistent daily nausea and vomiting.  She is able to keep down fluids.  She denies any sensation of food getting stuck.  She does note a weight loss of 10 pounds in the past month, which is unintentional.  May be secondary to poor oral intake.  She has been taking a PPI for years and recently switched from Nexium to Prilosec in November 2024.  She feels like her acid reflux is under control now.  She has tenderness to deep palpation that is focal in the left lower quadrant and radiates down her groin.  She does note some dark urine, but denies dysuria.  Her bowel movements are small volume but otherwise regular.  She denies any fever/chills, melena/hematochezia, hematemesis.  Of note, she does have diverticulosis on prior imaging and has had prior episodes of generalized abdominal pain with nausea and vomiting.   She had an episode of ischemic colitis in 2013 as seen on colonoscopy.  Differential considerations in include diverticulitis, constipation, nephrolithiasis.  She does not have health insurance at this time, so encouraged her to apply for Medicaid.  My recommendation is that she see a gastroenterologist for possible endoscopy after she gets insurance.    In the meantime, I will prescribe Zofran for symptomatic treatment.  We will collect a urine dipstick, CBC, and CMP today and will follow up in 4 weeks.   Update: Urine dipstick unconcerning for UTI or hematuria.  Dark urine is most likely in the setting of poor oral intake, less likely due to kidney stone or infection.   Patient discussed with Dr. Jonita Albee, MD [facility] Internal Medicine Resident PGY-1 Clinic Phone: [phone] Pager: [phone]

## 2024-02-05 NOTE — Patient Instructions (Addendum)
 Thank you, Ms.Oren Bracket for allowing [place] to provide your care today. Today we discussed your nausea, vomiting, and abdominal pain.  I have collected some labs today as seen below.  I will call you as soon as I have the results.  Hopefully we can figure out what is causing her symptoms.  Once you have insurance, I would recommend seeing a gastroenterologist for possible endoscopy.  I prescribed Zofran, which you should take as needed for your nausea.  Please work on applying to [facility] so that she can get more coverage for your health care.  I have ordered the following labs for you:  Lab Orders         CMP14 + Anion Gap         CBC with Diff         POCT Urinalysis Dipstick (81002)         POCT CBG (Fasting - Glucose)      I have ordered the following medication/changed the following medications:   Start the following medications: Meds ordered this encounter  Medications   ondansetron (ZOFRAN) 4 MG tablet    Sig: Take 1 tablet (4 mg total) by mouth daily as needed for nausea or vomiting.    Dispense:  30 tablet    Refill:  0     Follow up: 1 month  We look forward to seeing you next time. Please call our clinic at [phone] if you have any questions or concerns. The best time to call is Monday-Friday from 9am-4pm, but there is someone available 24/7. If after hours or the weekend, call the main hospital number and ask for the Internal Medicine Resident On-Call. If you need medication refills, please notify your pharmacy one week in advance and they will send [place] a request.   Thank you for trusting me with your care. Wishing you the best!   Annett Fabian, MD [facility]

## 2024-02-05 NOTE — Assessment & Plan Note (Addendum)
 She came to the clinic for an acute visit for nausea and vomiting for the past 3 weeks.  She states that she had some sort of GI bug at the end of January and since then has been unable to tolerate food due to persistent daily nausea and vomiting.  She is able to keep down fluids.  She denies any sensation of food getting stuck.  She does note a weight loss of 10 pounds in the past month, which is unintentional.  May be secondary to poor oral intake.  She has been taking a PPI for years and recently switched from Nexium to Prilosec in November 2024.  She feels like her acid reflux is under control now.  She has tenderness to deep palpation that is focal in the left lower quadrant and radiates down her groin.  She does note some dark urine, but denies dysuria.  Her bowel movements are small volume but otherwise regular.  She denies any fever/chills, melena/hematochezia, hematemesis.  Of note, she does have diverticulosis on prior imaging and has had prior episodes of generalized abdominal pain with nausea and vomiting.  She had an episode of ischemic colitis in 2013 as seen on colonoscopy.  Differential considerations in include diverticulitis, constipation, nephrolithiasis.  She does not have health insurance at this time, so encouraged her to apply for Medicaid.  My recommendation is that she see a gastroenterologist for possible endoscopy after she gets insurance.    In the meantime, I will prescribe Zofran for symptomatic treatment.  We will collect a urine dipstick, CBC, and CMP today and will follow up in 4 weeks.   Update: Urine dipstick unconcerning for UTI or hematuria.  Dark urine is most likely in the setting of poor oral intake, less likely due to kidney stone or infection.

## 2024-02-06 LAB — CMP14 + ANION GAP
ALT: 12 [IU]/L (ref 0–32)
AST: 15 [IU]/L (ref 0–40)
Albumin: 4.5 g/dL (ref 3.8–4.9)
Alkaline Phosphatase: 82 [IU]/L (ref 44–121)
Anion Gap: 16 mmol/L (ref 10.0–18.0)
BUN/Creatinine Ratio: 7 — ABNORMAL LOW (ref 9–23)
BUN: 8 mg/dL (ref 6–24)
Bilirubin Total: 0.9 mg/dL (ref 0.0–1.2)
CO2: 26 mmol/L (ref 20–29)
Calcium: 9.6 mg/dL (ref 8.7–10.2)
Chloride: 100 mmol/L (ref 96–106)
Creatinine, Ser: 1.11 mg/dL — ABNORMAL HIGH (ref 0.57–1.00)
Globulin, Total: 2.3 g/dL (ref 1.5–4.5)
Glucose: 93 mg/dL (ref 70–99)
Potassium: 3.8 mmol/L (ref 3.5–5.2)
Sodium: 142 mmol/L (ref 134–144)
Total Protein: 6.8 g/dL (ref 6.0–8.5)
eGFR: 58 mL/min/{1.73_m2} — ABNORMAL LOW (ref >59)

## 2024-02-06 LAB — CBC WITH DIFFERENTIAL/PLATELET
Basophils Absolute: 0.1 10*3/uL (ref 0.0–0.2)
Basos: 1 %
EOS (ABSOLUTE): 0.3 10*3/uL (ref 0.0–0.4)
Eos: 4 %
Hematocrit: 45.2 % (ref 34.0–46.6)
Hemoglobin: 14.5 g/dL (ref 11.1–15.9)
Immature Grans (Abs): 0 10*3/uL (ref 0.0–0.1)
Immature Granulocytes: 0 %
Lymphocytes Absolute: 1.9 10*3/uL (ref 0.7–3.1)
Lymphs: 29 %
MCH: 28.7 pg (ref 26.6–33.0)
MCHC: 32.1 g/dL (ref 31.5–35.7)
MCV: 90 fL (ref 79–97)
Monocytes Absolute: 0.5 10*3/uL (ref 0.1–0.9)
Monocytes: 7 %
Neutrophils Absolute: 3.9 10*3/uL (ref 1.4–7.0)
Neutrophils: 59 %
Platelets: 322 10*3/uL (ref 150–450)
RBC: 5.05 x10E6/uL (ref 3.77–5.28)
RDW: 12.9 % (ref 11.7–15.4)
WBC: 6.7 10*3/uL (ref 3.4–10.8)

## 2024-02-13 ENCOUNTER — Ambulatory Visit: Payer: Medicaid Other | Admitting: Student

## 2024-02-13 ENCOUNTER — Other Ambulatory Visit (HOSPITAL_COMMUNITY): Payer: Self-pay

## 2024-02-13 ENCOUNTER — Encounter: Payer: Self-pay | Admitting: Student

## 2024-02-13 ENCOUNTER — Other Ambulatory Visit: Payer: Self-pay

## 2024-02-13 VITALS — BP 126/68 | HR 93 | Temp 98.2°F | Ht 62.0 in | Wt 225.8 lb

## 2024-02-13 DIAGNOSIS — R21 Rash and other nonspecific skin eruption: Secondary | ICD-10-CM | POA: Diagnosis not present

## 2024-02-13 DIAGNOSIS — R1032 Left lower quadrant pain: Secondary | ICD-10-CM

## 2024-02-13 DIAGNOSIS — K219 Gastro-esophageal reflux disease without esophagitis: Secondary | ICD-10-CM | POA: Diagnosis not present

## 2024-02-13 MED ORDER — AMOXICILLIN-POT CLAVULANATE 875-125 MG PO TABS
1.0000 | ORAL_TABLET | Freq: Two times a day (BID) | ORAL | 0 refills | Status: DC
Start: 2024-02-13 — End: 2024-02-17
  Filled 2024-02-13: qty 20, 10d supply, fill #0

## 2024-02-13 MED ORDER — HYDROCORTISONE 1 % EX CREA
TOPICAL_CREAM | CUTANEOUS | 1 refills | Status: DC
  Filled 2024-02-13: qty 30, 15d supply, fill #0

## 2024-02-13 MED ORDER — POLYETHYLENE GLYCOL 3350 17 G PO PACK
17.0000 g | PACK | Freq: Every day | ORAL | 0 refills | Status: DC
  Filled 2024-02-13: qty 14, 14d supply, fill #0

## 2024-02-13 NOTE — Assessment & Plan Note (Signed)
 She has a mild macular rash on her central abdomen, which she says has been there for some time.  She occasionally gets rashes throughout her body.  It is pruritic.  I have prescribed topical hydrocortisone cream for symptom relief.

## 2024-02-13 NOTE — Progress Notes (Signed)
 Internal Medicine Clinic Attending  Case discussed with the resident at the time of the visit.  We reviewed the resident's history and exam and pertinent patient test results.  I agree with the assessment, diagnosis, and plan of care documented in the resident's note.

## 2024-02-13 NOTE — Assessment & Plan Note (Addendum)
 Long history of GERD, refractory to PPI.  Used to follow-up with [facility], Dr. Randa Evens.  Now has Medicaid and would like to follow-up with them about this.  She is also due for a colonoscopy following the resolution of her acute symptoms.

## 2024-02-13 NOTE — Assessment & Plan Note (Addendum)
 She returns to clinic for worsening of her left lower quadrant abdominal pain and new constipation.  She states that the pain has been persistent and sometimes wakes her up at night.  She also reports a low-grade fever of 100.3 yesterday.  She has not had a bowel movement since Friday.  Her nausea is persistent but her previous emesis has resolved.  She has been able to tolerate fluids but not much food. She denies any other new symptoms since her last visit. She took Dulcolax 2 days ago with no improvement of her constipation.    At her last visit, I suspected acute diverticulitis given her history of diverticulosis and left lower quadrant pain.  At that time, she was afebrile with no leukocytosis.  We treated her supportively with Zofran and encouraged adequate hydration.    Since her symptoms have worsened, I feel is appropriate to prescribe her a course of Augmentin 875-125 mg twice daily for the next 10 days.  Also instructed her to take MiraLAX for constipation.  She may add Dulcolax if the MiraLAX is not sufficient.  We will get a CT scan of the abdomen/pelvis with contrast, scheduled for tomorrow at noon.  I scheduled a follow-up appointment for 2 weeks and have given her clear return precautions to go to the ED if her symptoms acutely worsen.

## 2024-02-13 NOTE — Progress Notes (Signed)
    CC: Acute Concern of fever, LLQ abdominal pain  HPI:  [name] is a 58 y.o. female with pertinent PMH of diverticulosis, GERD who presents to the clinic for follow up of abdominal pain. Please see assessment and plan below for further details.  Review of Systems:   Pertinent items noted in HPI and/or A&P.  Physical Exam:  Vitals:   02/13/24 1015  BP: 126/68  Pulse: 93  Temp: 98.2 F (36.8 C)  TempSrc: Oral  SpO2: 99%  Weight: 225 lb 12.8 oz (102.4 kg)  Height: 5\' 2"  (1.575 m)   Well-appearing middle-aged female, large body habitus, in no acute distress Heart rate and rhythm regular, no murmurs, euvolemic Lungs clear to auscultation bilaterally, normal respiratory rate and effort Abdomen soft, nondistended; tenderness to palpation in the left lower quadrant extending to the suprapubic region.  No guarding or rebound tenderness No focal neurological deficits Mild macular, pruritic rash over the center of the abdomen  Assessment & Plan:   LLQ abdominal pain She returns to clinic for worsening of her left lower quadrant abdominal pain and new constipation.  She states that the pain has been persistent and sometimes wakes her up at night.  She also reports a low-grade fever of 100.3 yesterday.  She has not had a bowel movement since Friday.  Her nausea is persistent but her previous emesis has resolved.  She has been able to tolerate fluids but not much food. She denies any other new symptoms since her last visit. She took Dulcolax 2 days ago with no improvement of her constipation.    At her last visit, I suspected acute diverticulitis given her history of diverticulosis and left lower quadrant pain.  At that time, she was afebrile with no leukocytosis.  We treated her supportively with Zofran and encouraged adequate hydration.    Since her symptoms have worsened, I feel is appropriate to prescribe her a course of Augmentin 875-125 mg twice daily for the next 10 days.  Also  instructed her to take MiraLAX for constipation.  She may add Dulcolax if the MiraLAX is not sufficient.  We will get a CT scan of the abdomen/pelvis with contrast, scheduled for tomorrow at noon.  I scheduled a follow-up appointment for 2 weeks and have given her clear return precautions to go to the ED if her symptoms acutely worsen.   Rash She has a mild macular rash on her central abdomen, which she says has been there for some time.  She occasionally gets rashes throughout her body.  It is pruritic.  I have prescribed topical hydrocortisone cream for symptom relief.  GERD (gastroesophageal reflux disease) Long history of GERD, refractory to PPI.  Used to follow-up with [facility], Dr. Randa Evens.  Now has Medicaid and would like to follow-up with them about this.  She is also due for a colonoscopy following the resolution of her acute symptoms.   Patient discussed with Dr. Synthia Innocent, MD [facility] Internal Medicine Resident PGY-1 Clinic Phone: [phone] Pager: [phone]

## 2024-02-13 NOTE — Patient Instructions (Addendum)
 Thank you, Ms.Oren Bracket for allowing [place] to provide your care today.   As we discussed, I suspect you may acute diverticulitis.  We will get a CT scan of your abdomen to confirm this.  In the meantime, I have sent in a prescription for an antibiotic called Augmentin, which you should take twice a day for the next 10 days.    I have also sent in MiraLAX, which is a stool softener you may use once a day.  If you remain constipated despite the MiraLAX, you can also add the Dulcolax you were taking previously.  I have sent in some hydrocortisone cream to help you with relief for your rash.  I have placed a referral for gastroenterology to follow-up for your GERD symptoms.  I have ordered the following medication/changed the following medications:   Start the following medications: Meds ordered this encounter  Medications   amoxicillin-clavulanate (AUGMENTIN) 875-125 MG tablet    Sig: Take 1 tablet by mouth 2 (two) times daily for 10 days.    Dispense:  20 tablet    Refill:  0   polyethylene glycol (MIRALAX) 17 g packet    Sig: Mix 1 capful (17 grams) in 8 ounces of fluid and take by mouth daily.    Dispense:  14 each    Refill:  0   hydrocortisone cream 1 %    Sig: Apply to affected area 2 times daily    Dispense:  30 g    Refill:  1     Follow up: 2 weeks  We look forward to seeing you next time. Please call our clinic at [phone] if you have any questions or concerns. The best time to call is Monday-Friday from 9am-4pm, but there is someone available 24/7. If after hours or the weekend, call the main hospital number and ask for the Internal Medicine Resident On-Call. If you need medication refills, please notify your pharmacy one week in advance and they will send [place] a request.   Thank you for trusting me with your care. Wishing you the best!   Annett Fabian, MD [facility]

## 2024-02-14 ENCOUNTER — Telehealth: Payer: Self-pay | Admitting: Internal Medicine

## 2024-02-14 ENCOUNTER — Ambulatory Visit (HOSPITAL_COMMUNITY)
Admission: RE | Admit: 2024-02-14 | Discharge: 2024-02-14 | Disposition: A | Discharge status: Home / Self Care | Payer: Medicaid Other | Source: Ambulatory Visit | Attending: Internal Medicine | Admitting: Internal Medicine

## 2024-02-14 DIAGNOSIS — R1032 Left lower quadrant pain: Secondary | ICD-10-CM | POA: Diagnosis present

## 2024-02-14 MED ORDER — IOHEXOL 350 MG/ML SOLN
75.0000 mL | Freq: Once | INTRAVENOUS | Status: AC | PRN
  Administered 2024-02-14: 75 mL via INTRAVENOUS

## 2024-02-14 NOTE — Progress Notes (Signed)
 Internal Medicine Clinic Attending  I was physically present during the key portions of the resident provided service and participated in the medical decision making of patient's management care. I reviewed pertinent patient test results.  The assessment, diagnosis, and plan were formulated together and I agree with the documentation in the resident's note.  Mercie Eon, MD     On my exam, patient is well appearing. She has normal bowel sounds. Her abdomen is soft, with tenderness to palpation over her LLQ. No rebound tenderness or guarding. I share Dr Versie Starks' concern for diverticulitis, given her LLQ abdominal pain and fever. Agree with CT AP, which will be done on 2/28.

## 2024-02-14 NOTE — Telephone Encounter (Signed)
 Received notification regarding CT abdomen/pelvis performed earlier today. I discussed the results with ordering provider, Dr. Versie Starks, who called the patient to discuss the results. He learned during this call that her pain has worsened and she has not had a bowel movement. He recommended that she go to the ED and patient stated she would like to wait until the morning to go.  Champ Mungo, DO Internal Medicine PGY-3

## 2024-02-15 ENCOUNTER — Observation Stay (HOSPITAL_COMMUNITY)
Admission: EM | Admit: 2024-02-15 | Discharge: 2024-02-17 | Disposition: A | Discharge status: Home / Self Care | Attending: Internal Medicine | Admitting: Internal Medicine

## 2024-02-15 ENCOUNTER — Other Ambulatory Visit: Payer: Self-pay

## 2024-02-15 ENCOUNTER — Encounter (HOSPITAL_COMMUNITY): Payer: Self-pay

## 2024-02-15 DIAGNOSIS — Z6841 Body Mass Index (BMI) 40.0 and over, adult: Secondary | ICD-10-CM | POA: Insufficient documentation

## 2024-02-15 DIAGNOSIS — K219 Gastro-esophageal reflux disease without esophagitis: Secondary | ICD-10-CM | POA: Diagnosis not present

## 2024-02-15 DIAGNOSIS — I10 Essential (primary) hypertension: Secondary | ICD-10-CM | POA: Insufficient documentation

## 2024-02-15 DIAGNOSIS — K529 Noninfective gastroenteritis and colitis, unspecified: Secondary | ICD-10-CM | POA: Diagnosis not present

## 2024-02-15 DIAGNOSIS — E785 Hyperlipidemia, unspecified: Secondary | ICD-10-CM | POA: Insufficient documentation

## 2024-02-15 DIAGNOSIS — Z79899 Other long term (current) drug therapy: Secondary | ICD-10-CM | POA: Diagnosis not present

## 2024-02-15 DIAGNOSIS — K5900 Constipation, unspecified: Secondary | ICD-10-CM | POA: Insufficient documentation

## 2024-02-15 DIAGNOSIS — R933 Abnormal findings on diagnostic imaging of other parts of digestive tract: Secondary | ICD-10-CM

## 2024-02-15 DIAGNOSIS — E876 Hypokalemia: Secondary | ICD-10-CM | POA: Insufficient documentation

## 2024-02-15 DIAGNOSIS — R1032 Left lower quadrant pain: Secondary | ICD-10-CM | POA: Diagnosis not present

## 2024-02-15 DIAGNOSIS — K5792 Diverticulitis of intestine, part unspecified, without perforation or abscess without bleeding: Principal | ICD-10-CM | POA: Insufficient documentation

## 2024-02-15 LAB — COMPREHENSIVE METABOLIC PANEL
ALT: 10 U/L (ref 0–44)
AST: 13 U/L — ABNORMAL LOW (ref 15–41)
Albumin: 3.3 g/dL — ABNORMAL LOW (ref 3.5–5.0)
Alkaline Phosphatase: 51 U/L (ref 38–126)
Anion gap: 10 (ref 5–15)
BUN: 11 mg/dL (ref 6–20)
CO2: 24 mmol/L (ref 22–32)
Calcium: 8.6 mg/dL — ABNORMAL LOW (ref 8.9–10.3)
Chloride: 101 mmol/L (ref 98–111)
Creatinine, Ser: 1.08 mg/dL — ABNORMAL HIGH (ref 0.44–1.00)
GFR, Estimated: 60 mL/min — ABNORMAL LOW (ref >60)
Glucose, Bld: 125 mg/dL — ABNORMAL HIGH (ref 70–99)
Potassium: 3.2 mmol/L — ABNORMAL LOW (ref 3.5–5.1)
Sodium: 135 mmol/L (ref 135–145)
Total Bilirubin: 1.4 mg/dL — ABNORMAL HIGH (ref 0.0–1.2)
Total Protein: 6.5 g/dL (ref 6.5–8.1)

## 2024-02-15 LAB — URINALYSIS, ROUTINE W REFLEX MICROSCOPIC
Bilirubin Urine: NEGATIVE
Glucose, UA: NEGATIVE mg/dL
Hgb urine dipstick: NEGATIVE
Ketones, ur: NEGATIVE mg/dL
Leukocytes,Ua: NEGATIVE
Nitrite: NEGATIVE
Protein, ur: NEGATIVE mg/dL
Specific Gravity, Urine: 1.017 (ref 1.005–1.030)
pH: 5 (ref 5.0–8.0)

## 2024-02-15 LAB — C-REACTIVE PROTEIN: CRP: 11.1 mg/dL — ABNORMAL HIGH (ref <1.0)

## 2024-02-15 LAB — LIPASE, BLOOD: Lipase: 21 U/L (ref 11–51)

## 2024-02-15 LAB — CBC
HCT: 37.1 % (ref 36.0–46.0)
Hemoglobin: 12.4 g/dL (ref 12.0–15.0)
MCH: 29.5 pg (ref 26.0–34.0)
MCHC: 33.4 g/dL (ref 30.0–36.0)
MCV: 88.3 fL (ref 80.0–100.0)
Platelets: 255 10*3/uL (ref 150–400)
RBC: 4.2 MIL/uL (ref 3.87–5.11)
RDW: 13 % (ref 11.5–15.5)
WBC: 8 10*3/uL (ref 4.0–10.5)
nRBC: 0 % (ref 0.0–0.2)

## 2024-02-15 LAB — HIV ANTIBODY (ROUTINE TESTING W REFLEX): HIV Screen 4th Generation wRfx: NONREACTIVE

## 2024-02-15 LAB — HCG, SERUM, QUALITATIVE: Preg, Serum: NEGATIVE

## 2024-02-15 MED ORDER — ENOXAPARIN SODIUM 40 MG/0.4ML IJ SOSY
40.0000 mg | PREFILLED_SYRINGE | INTRAMUSCULAR | Status: DC
  Administered 2024-02-15 – 2024-02-16 (×2): 40 mg via SUBCUTANEOUS
  Filled 2024-02-15 (×2): qty 0.4

## 2024-02-15 MED ORDER — ACETAMINOPHEN 500 MG PO TABS
1000.0000 mg | ORAL_TABLET | Freq: Four times a day (QID) | ORAL | Status: DC
  Administered 2024-02-15 – 2024-02-17 (×7): 1000 mg via ORAL
  Filled 2024-02-15 (×9): qty 2

## 2024-02-15 MED ORDER — LACTATED RINGERS IV BOLUS: 500.0000 mL | Freq: Once | INTRAVENOUS | Status: DC

## 2024-02-15 MED ORDER — POTASSIUM CHLORIDE CRYS ER 20 MEQ PO TBCR
40.0000 meq | EXTENDED_RELEASE_TABLET | Freq: Two times a day (BID) | ORAL | Status: AC
  Administered 2024-02-15 (×2): 40 meq via ORAL
  Filled 2024-02-15 (×2): qty 2

## 2024-02-15 MED ORDER — ROSUVASTATIN CALCIUM 20 MG PO TABS
20.0000 mg | ORAL_TABLET | Freq: Every day | ORAL | Status: DC
  Administered 2024-02-15 – 2024-02-17 (×3): 20 mg via ORAL
  Filled 2024-02-15 (×3): qty 1

## 2024-02-15 MED ORDER — PANTOPRAZOLE SODIUM 40 MG PO TBEC
40.0000 mg | DELAYED_RELEASE_TABLET | Freq: Every day | ORAL | Status: DC
  Administered 2024-02-15 – 2024-02-17 (×3): 40 mg via ORAL
  Filled 2024-02-15 (×3): qty 1

## 2024-02-15 MED ORDER — MORPHINE SULFATE (PF) 4 MG/ML IV SOLN
4.0000 mg | Freq: Once | INTRAVENOUS | Status: AC
  Administered 2024-02-15: 4 mg via INTRAVENOUS
  Filled 2024-02-15: qty 1

## 2024-02-15 MED ORDER — ONDANSETRON HCL 4 MG/2ML IJ SOLN
4.0000 mg | Freq: Once | INTRAMUSCULAR | Status: AC
  Administered 2024-02-15: 4 mg via INTRAVENOUS
  Filled 2024-02-15: qty 2

## 2024-02-15 MED ORDER — LACTATED RINGERS IV BOLUS
1000.0000 mL | Freq: Once | INTRAVENOUS | Status: AC
  Administered 2024-02-15: 1000 mL via INTRAVENOUS

## 2024-02-15 MED ORDER — OXYCODONE HCL 5 MG PO TABS
5.0000 mg | ORAL_TABLET | Freq: Four times a day (QID) | ORAL | Status: DC | PRN
  Administered 2024-02-15 – 2024-02-16 (×4): 5 mg via ORAL
  Filled 2024-02-15 (×5): qty 1

## 2024-02-15 MED ORDER — SODIUM CHLORIDE 0.9 % IV BOLUS
500.0000 mL | Freq: Once | INTRAVENOUS | Status: AC
  Administered 2024-02-15: 500 mL via INTRAVENOUS

## 2024-02-15 MED ORDER — SODIUM CHLORIDE 0.9 % IV SOLN
3.0000 g | Freq: Four times a day (QID) | INTRAVENOUS | Status: DC
  Administered 2024-02-15 – 2024-02-17 (×8): 3 g via INTRAVENOUS
  Filled 2024-02-15 (×9): qty 8

## 2024-02-15 MED ORDER — POLYETHYLENE GLYCOL 3350 17 G PO PACK
17.0000 g | PACK | Freq: Every day | ORAL | Status: DC
  Administered 2024-02-16 – 2024-02-17 (×2): 17 g via ORAL
  Filled 2024-02-15 (×3): qty 1

## 2024-02-15 MED ORDER — ONDANSETRON HCL 4 MG PO TABS: 4.0000 mg | ORAL_TABLET | Freq: Every day | ORAL | Status: DC | PRN

## 2024-02-15 MED ORDER — SODIUM CHLORIDE 0.9 % IV SOLN
3.0000 g | Freq: Once | INTRAVENOUS | Status: AC
  Administered 2024-02-15: 3 g via INTRAVENOUS
  Filled 2024-02-15: qty 8

## 2024-02-15 NOTE — ED Triage Notes (Signed)
 Pt states she was called yesterday after having a CT scan that showed diverticulosis and diverticulitis. Pt's MD told her to come to ED to be admitted d/t not being able to control the inflammation with oral antibiotics.

## 2024-02-15 NOTE — ED Notes (Signed)
 ED TO INPATIENT HANDOFF REPORT  ED Nurse Name and Phone #: Angelica Chessman, [phone]  S Name/Age/Gender [name] 58 y.o. female Room/Bed: 010C/010C  Code Status   Code Status: Full Code  Home/SNF/Other Home Patient oriented to: self, place, time, and situation Is this baseline? Yes   Triage Complete: Triage complete  Chief Complaint Colitis [K52.9]  Triage Note Pt states she was called yesterday after having a CT scan that showed diverticulosis and diverticulitis. Pt's MD told her to come to ED to be admitted d/t not being able to control the inflammation with oral antibiotics.   Allergies Allergies  Allergen Reactions   Sulfa Drugs Cross Reactors Nausea And Vomiting    "made me sick- I had to have IVs"    Level of Care/Admitting Diagnosis ED Disposition     ED Disposition  Admit   Condition  --   Comment  Hospital Area: [facility] [[id number]]  Level of Care: Med-Surg [16]  May place patient in observation at [facility] or [facility] if equivalent level of care is available:: No  Covid Evaluation: Asymptomatic - no recent exposure (last 10 days) testing not required  Diagnosis: Colitis [161096]  Admitting Physician: Silvio Pate  Attending Physician: Gust Rung [[id number]]          B Medical/Surgery History Past Medical History:  Diagnosis Date   COPD (chronic obstructive pulmonary disease) (HCC) 01/19/2014   GERD (gastroesophageal reflux disease)    Hypertension    Past Surgical History:  Procedure Laterality Date   COLONOSCOPY  11/19/2012   Procedure: COLONOSCOPY;  Surgeon: Vertell Novak., MD;  Location: [facility] ENDOSCOPY;  Service: Endoscopy;  Laterality: N/A;   TUBAL LIGATION       A IV Location/Drains/Wounds Patient Lines/Drains/Airways Status     Active Line/Drains/Airways     Name Placement date Placement time Site Days   Peripheral IV 02/15/24 20 G Left Antecubital 02/15/24  0514  Antecubital  less than 1             Intake/Output Last 24 hours  Intake/Output Summary (Last 24 hours) at 02/15/2024 1106 Last data filed at 02/15/2024 0556 Gross per 24 hour  Intake 100.23 ml  Output --  Net 100.23 ml    Labs/Imaging Results for orders placed or performed during the hospital encounter of 02/15/24 (from the past 48 hours)  Urinalysis, Routine w reflex microscopic -Urine, Clean Catch     Status: None   Collection Time: 02/15/24  4:37 AM  Result Value Ref Range   Color, Urine YELLOW YELLOW   APPearance CLEAR CLEAR   Specific Gravity, Urine 1.017 1.005 - 1.030   pH 5.0 5.0 - 8.0   Glucose, UA NEGATIVE NEGATIVE mg/dL   Hgb urine dipstick NEGATIVE NEGATIVE   Bilirubin Urine NEGATIVE NEGATIVE   Ketones, ur NEGATIVE NEGATIVE mg/dL   Protein, ur NEGATIVE NEGATIVE mg/dL   Nitrite NEGATIVE NEGATIVE   Leukocytes,Ua NEGATIVE NEGATIVE    Comment: Performed at [facility], [address] [place], [place] [address]  Lipase, blood     Status: None   Collection Time: 02/15/24  4:51 AM  Result Value Ref Range   Lipase 21 11 - 51 U/L    Comment: Performed at [facility], [address], [place], [place] [address]  Comprehensive metabolic panel     Status: Abnormal   Collection Time: 02/15/24  4:51 AM  Result Value Ref Range   Sodium 135  135 - 145 mmol/L   Potassium 3.2 (L) 3.5 - 5.1 mmol/L   Chloride 101 98 - 111 mmol/L   CO2 24 22 - 32 mmol/L   Glucose, Bld 125 (H) 70 - 99 mg/dL    Comment: Glucose reference range applies only to samples taken after fasting for at least 8 hours.   BUN 11 6 - 20 mg/dL   Creatinine, Ser 8.65 (H) 0.44 - 1.00 mg/dL   Calcium 8.6 (L) 8.9 - 10.3 mg/dL   Total Protein 6.5 6.5 - 8.1 g/dL   Albumin 3.3 (L) 3.5 - 5.0 g/dL   AST 13 (L) 15 - 41 U/L   ALT 10 0 - 44 U/L   Alkaline Phosphatase 51 38 - 126 U/L   Total Bilirubin 1.4 (H) 0.0 - 1.2 mg/dL   GFR, Estimated 60 (L) >60 mL/min    Comment: (NOTE) Calculated using the CKD-EPI Creatinine Equation (2021)     Anion gap 10 5 - 15    Comment: Performed at [facility], [address], [place], [place] [address]  CBC     Status: None   Collection Time: 02/15/24  4:51 AM  Result Value Ref Range   WBC 8.0 4.0 - 10.5 K/uL   RBC 4.20 3.87 - 5.11 MIL/uL   Hemoglobin 12.4 12.0 - 15.0 g/dL   HCT 62.9 52.8 - 41.3 %   MCV 88.3 80.0 - 100.0 fL   MCH 29.5 26.0 - 34.0 pg   MCHC 33.4 30.0 - 36.0 g/dL   RDW 24.4 01.0 - 27.2 %   Platelets 255 150 - 400 K/uL   nRBC 0.0 0.0 - 0.2 %    Comment: Performed at [facility], [address], [place], [place] [address]  hCG, serum, qualitative     Status: None   Collection Time: 02/15/24  4:51 AM  Result Value Ref Range   Preg, Serum NEGATIVE NEGATIVE    Comment:        THE SENSITIVITY OF THIS METHODOLOGY IS >10 mIU/mL. Performed at [facility], [address] [place], [place] [address]    CT ABDOMEN PELVIS W CONTRAST Result Date: 02/14/2024 CLINICAL DATA:  Left lower quadrant pain EXAM: CT ABDOMEN AND PELVIS WITH CONTRAST TECHNIQUE: Multidetector CT imaging of the abdomen and pelvis was performed using the standard protocol following bolus administration of intravenous contrast. RADIATION DOSE REDUCTION: This exam was performed according to the departmental dose-optimization program which includes automated exposure control, adjustment of the mA and/or kV according to patient size and/or use of iterative reconstruction technique. CONTRAST:  75mL OMNIPAQUE IOHEXOL 350 MG/ML SOLN COMPARISON:  01/24/2018 FINDINGS: Lower chest: No acute abnormality. Hepatobiliary: No focal liver abnormality is seen. No gallstones, gallbladder wall thickening, or biliary dilatation. Pancreas: Unremarkable. No pancreatic ductal dilatation or surrounding inflammatory changes. Spleen: Normal in size without focal abnormality. Adrenals/Urinary Tract: Unremarkable adrenal glands. Kidneys enhance symmetrically without focal lesion, stone, or hydronephrosis. Ureters are  nondilated. Urinary bladder is decompressed. Stomach/Bowel: Stomach within normal limits. No dilated loops of bowel. Normal appendix in the midline of the low pelvis. Colonic diverticulosis. Long segment of moderately thickened colon spanning from the mid descending colon through the mid sigmoid level with associated fat stranding. Numerous prominent diverticula throughout the involved segment. Small amount of adjacent free fluid. No organized pericolonic fluid collections. No extraluminal air. There are numerous (estimated 20-25) hyperdense pills throughout the colonic lumen. Vascular/Lymphatic: Aortic atherosclerosis. No enlarged abdominal or pelvic lymph nodes.  Reproductive: Uterus and bilateral adnexa are unremarkable. Other: Small volume fluid within the cul-de-sac. There may be early partial rim enhancement (series 3, image 65), and developing abscess is not excluded. No free intraperitoneal air. Musculoskeletal: No acute or significant osseous findings. IMPRESSION: 1. Severe colitis involving the mid descending colon through the mid sigmoid level. Numerous diverticula throughout the inflamed segment which could represent a component of superimposed diverticulitis. 2. Small volume fluid within the cul-de-sac. There may be early partial rim enhancement, and developing abscess is not excluded. 3. Numerous (estimated 20-25) hyperdense pills throughout the colon. Correlate with patient's medication history. 4. Aortic atherosclerosis (ICD10-I70.0). These results will be called to the ordering clinician or representative by the Radiologist Assistant, and communication documented in the PACS or [employer]. Electronically Signed   By: Duanne Guess D.O.   On: 02/14/2024 15:11    Pending Labs Unresulted Labs (From admission, onward)     Start     Ordered   02/16/24 0500  Magnesium  Tomorrow morning,   R        02/15/24 0926   02/16/24 0500  Basic metabolic panel  Tomorrow morning,   R        02/15/24  0927   02/16/24 0500  CBC  Tomorrow morning,   R        02/15/24 0927   02/15/24 0636  HIV Antibody (routine testing w rflx)  (HIV Antibody (Routine testing w reflex) panel)  Once,   R        02/15/24 0639            Vitals/Pain Today's Vitals   02/15/24 0914 02/15/24 0915 02/15/24 1030 02/15/24 1045  BP:  (!) 88/51 (!) 91/57 101/70  Pulse:  71 70 77  Resp:   16   Temp: 98.7 F (37.1 C)     TempSrc: Oral     SpO2:  94% 95% 97%  Weight:      Height:      PainSc:        Isolation Precautions No active isolations  Medications Medications  enoxaparin (LOVENOX) injection 40 mg (has no administration in time range)  oxyCODONE (Oxy IR/ROXICODONE) immediate release tablet 5 mg (5 mg Oral Given 02/15/24 0913)  acetaminophen (TYLENOL) tablet 1,000 mg (1,000 mg Oral Given 02/15/24 0714)  potassium chloride SA (KLOR-CON M) CR tablet 40 mEq (40 mEq Oral Given 02/15/24 0714)  polyethylene glycol (MIRALAX / GLYCOLAX) packet 17 g (17 g Oral Not Given 02/15/24 0914)  pantoprazole (PROTONIX) EC tablet 40 mg (40 mg Oral Given 02/15/24 0913)  ondansetron (ZOFRAN) tablet 4 mg (has no administration in time range)  rosuvastatin (CRESTOR) tablet 20 mg (20 mg Oral Given 02/15/24 0913)  Ampicillin-Sulbactam (UNASYN) 3 g in sodium chloride 0.9 % 100 mL IVPB (has no administration in time range)  Ampicillin-Sulbactam (UNASYN) 3 g in sodium chloride 0.9 % 100 mL IVPB (0 g Intravenous Stopped 02/15/24 0556)  sodium chloride 0.9 % bolus 500 mL (500 mLs Intravenous New Bag/Given 02/15/24 0525)  morphine (PF) 4 MG/ML injection 4 mg (4 mg Intravenous Given 02/15/24 0521)  ondansetron (ZOFRAN) injection 4 mg (4 mg Intravenous Given 02/15/24 0521)  lactated ringers bolus 1,000 mL (1,000 mLs Intravenous New Bag/Given 02/15/24 1040)    Mobility walks     Focused Assessments GI   R Recommendations: See Admitting Provider Note  Report given to:   Additional Notes: Pt is AOX4, walky/talky, can verbalize needs, has  had a soft/low BP,  Bender, DO aware, she said it was low the other day to when she went to the clinic

## 2024-02-15 NOTE — H&P (Addendum)
 Date: 02/15/2024         Patient Name:  [name] MRN: [id number]  DOB: Nov 17, 1966 Age / Sex: 58 y.o., female   PCP: Annett Fabian, MD         Medical Service: Internal Medicine Teaching Service         Attending Physician: Dr. Gust Rung, DO     First Contact: Dr. Faith Rogue, DO Pager [phone]  Pager: [phone]  Second Contact: Dr. Champ Mungo, DO Pager [phone] Pager: [phone]       After Hours (After 5p/  First Contact Pager: [phone]  weekends / holidays): Second Contact Pager: [phone]   Chief Concern: Abdominal pain  History of Present Illness:  [name] is a 58 y.o. female with PMHx of with a history of hypertension, GERD, hyperlipidemia who presented to the ED as a direct admit from clinic due to progressively worsening colitis/diverticulitis .   Of note, she has a history of rectal bleeding 11 years ago, colonoscopy showed diverticulosis and ischemic colitis.  She was recommended to follow-up with GI outpatient, however unable to be due to insurance.  After hospitalization, bloody diarrhea resolved.  No recent colonoscopy since 2013.  She endorses a four week history of crampy abdominal pain, localized in the left lower quadrant, associated with nausea and vomiting.  Prior to the episode, she had diarrhea.  Abdominal pain has progressively gotten worse, denies blood in his stools, no bowel movement in 8 days.  Does not really feel constipated has been using MiraLAX. She has been taking Zofran at home for nausea, she is passing gas.  She has had very little appetite.   Patient was seen in IM TSH 2 days ago for left lower quadrant pain,initiated on Augmentin for presumed diverticulitis and scheduled for outpatient CT .  Results of CT showed severe colitis, patient was then advised to come to the ED for admission for IV antibiotics and pain control.  At home today, she had a fever of one 101.3, endorses night sweats, denies weight loss.  Denies any history of  colon cancer in the family.   Allergies: Allergies  Allergen Reactions   Sulfa Drugs Cross Reactors Nausea And Vomiting    "made me sick- I had to have IVs"     Past Medical History: Past Medical History:  Diagnosis Date   COPD (chronic obstructive pulmonary disease) (HCC) 01/19/2014   GERD (gastroesophageal reflux disease)    Hypertension      Medications: Current Meds  Medication Sig   amLODipine (NORVASC) 10 MG tablet Take 1 tablet (10 mg total) by mouth daily.   amoxicillin-clavulanate (AUGMENTIN) 875-125 MG tablet Take 1 tablet by mouth 2 (two) times daily for 10 days.   loratadine (CLARITIN) 10 MG tablet Take 10 mg by mouth daily.   olmesartan-hydrochlorothiazide (BENICAR HCT) 40-12.5 MG tablet Take 1 tablet by mouth daily.   omeprazole (PRILOSEC OTC) 20 MG tablet Take 40 mg by mouth daily.   ondansetron (ZOFRAN) 4 MG tablet Take 1 tablet (4 mg total) by mouth daily as needed for nausea or vomiting.   polyethylene glycol (MIRALAX) 17 g packet Mix 1 capful (17 grams) in 8 ounces of fluid and take by mouth daily.   rosuvastatin (CRESTOR) 20 MG tablet Take 1 tablet (20 mg total) by mouth daily.     Surgical History: Past Surgical History:  Procedure Laterality Date   COLONOSCOPY  11/19/2012   Procedure: COLONOSCOPY;  Surgeon: Tresea Mall  Montez Hageman., MD;  Location: [facility] ENDOSCOPY;  Service: Endoscopy;  Laterality: N/A;   TUBAL LIGATION      Family History:  Family History  Problem Relation Age of Onset   Hypertension Mother    Stroke Mother    Hypertension Father    Heart disease Father    COPD Sister    Heart disease Brother    Hypertension Brother    Cancer Maternal Aunt    Cancer Paternal Aunt    Cancer Maternal Grandmother    Heart disease Maternal Grandfather    Hypertension Maternal Grandfather    Heart disease Paternal Grandmother    Diabetes Paternal Grandmother    Heart disease Paternal Grandfather    Hypertension Paternal Grandfather    ** Pertinent  Family history of stroke in mom, heart failure in dad, GI cancer in grandparent.  Social History:  Lives in [place] with husband Occupation:retired Support: Husband Level of Function: Independent of ADLs and iADL's PCP: Annett Fabian, MD Substances:  Physical Exam:  Blood pressure 98/63, pulse 92, temperature 98.6 F (37 C), resp. rate 17, height 5\' 2"  (1.575 m), weight 100.7 kg, SpO2 94%.  Well-appearing, not in acute distress Lungs with normal air movement, no wheezes. Normal radial pulse Heart with regular rate, no murmurs Soft abdomen, TTP in left lower quadrant, no rebound tenderness or tenderness to percussion Skin warm and dry Pleasant  Labs:     Latest Ref Rng & Units 02/15/2024    4:51 AM 02/05/2024    9:49 AM 04/10/2023    2:40 PM  CBC  WBC 4.0 - 10.5 K/uL 8.0  6.7  8.4   Hemoglobin 12.0 - 15.0 g/dL 14.7  82.9  56.2   Hematocrit 36.0 - 46.0 % 37.1  45.2  36.6   Platelets 150 - 400 K/uL 255  322  246         Latest Ref Rng & Units 02/15/2024    4:51 AM 02/05/2024    9:49 AM 04/10/2023    2:40 PM  CMP  Glucose 70 - 99 mg/dL 130  93  865   BUN 6 - 20 mg/dL 11  8  16    Creatinine 0.44 - 1.00 mg/dL 7.84  6.96  2.95   Sodium 135 - 145 mmol/L 135  142  138   Potassium 3.5 - 5.1 mmol/L 3.2  3.8  3.1   Chloride 98 - 111 mmol/L 101  100  101   CO2 22 - 32 mmol/L 24  26  27    Calcium 8.9 - 10.3 mg/dL 8.6  9.6  9.0   Total Protein 6.5 - 8.1 g/dL 6.5  6.8    Total Bilirubin 0.0 - 1.2 mg/dL 1.4  0.9    Alkaline Phos 38 - 126 U/L 51  82    AST 15 - 41 U/L 13  15    ALT 0 - 44 U/L 10  12       Images and other studies: CT with acute colitis of distal descending to sigmoid colon.  Assessment & Plan:  58 year old with remote history of ischemic colitis and diverticulosis presents with left lower quadrant abdominal pain concerning for acute colitis.  Principal Problem:   Colitis Active Problems:   Essential hypertension   GERD (gastroesophageal reflux  disease)   Obesity, Class III, BMI 40-49.9 (morbid obesity) (HCC)   Constipation   Hypokalemia   Acute colitis Marked inflammation involving mid descending colon through mid sigmoid level per CT scan.  Numerous  diverticula throughout.  Indolent course, over the last month.  This could be diverticulitis, could also be ischemic colitis.  I note that in 2013 she had an episode of ischemic colitis that affected the same region.  However, she is not having any rectal bleeding which makes this less likely.  She has no history of A-fib, the etiology of her ischemic colitis in 2013 is unknown as far as I can tell, echo was normal, she was not started on aspirin or blood thinners.  Risk factors are hypertension, hyperlipidemia, and smoking history.  She is stable.  She is not septic.  No abscess or perforation. Started on Augmentin in the outpatient setting, will transition to Unasyn.  If she does not get better soon she may need evaluation for recurrent episode of ischemic colitis.  She is eating and drinking a little bit at home, passing flatus, regular diet is okay for now. - Unasyn per pharmacy - Tylenol 1000 mg every 6 hours - Oxycodone 5 mg every 6 hours as needed - Zofran milligrams daily as needed for nausea  Hypokalemia In setting of decreased p.o. intake.  Will replace with p.o. potassium. - KCl 40 mEq p.o. x 2  Hypertension Blood pressure is normal now, unusual for her even when she is adherent to her meds.  Will hold antihypertensives, probably low in setting of poor solute intake and less than usual p.o. fluid consumption.  Constipation No real bowel movement in a week per her report.  Does not really feel constipated.  Has some bloating.  Think this could be from poor p.o. intake and probably some slowed transit through irritated gut.  Do not suspect obstruction, since she is passing flatus.  Will start MiraLAX.  Regular diet.  Diet: Normal VTE: Enoxaparin  Code:  Full   Signed: Laretta Bolster, MD 02/15/2024, 7:14 AM

## 2024-02-15 NOTE — ED Notes (Signed)
 Patient leaving the floor in stable condition, AOX4, with her belongings, family and staff.

## 2024-02-15 NOTE — Hospital Course (Addendum)
 Principal Problem:   Colitis Active Problems:   Essential hypertension   GERD (gastroesophageal reflux disease)   Obesity, Class III, BMI 40-49.9 (morbid obesity) (HCC)   Constipation   Hypokalemia  Resolved Problems:   * No resolved hospital problems. *  Consults:***  Procedures:***  Follow-up items:***  57 year old with remote history of ischemic colitis and diverticulosis presents with left lower quadrant abdominal pain concerning for acute colitis.  Acute colitis Marked inflammation involving mid descending colon through mid sigmoid level per CT scan.  Numerous diverticula throughout.  Indolent course, over the last month.  This could be diverticulitis, could also be ischemic colitis.  I note that in 2013 she had an episode of ischemic colitis that affected the same region.  However, she is not having any rectal bleeding which makes this less likely.  She has no history of A-fib, the etiology of her ischemic colitis in 2013 is unknown as far as I can tell, echo was normal, she was not started on aspirin or blood thinners.  Risk factors are hypertension, hyperlipidemia, and smoking history.  She is stable.  She is not septic.  Started on Augmentin in the outpatient setting, will transition to Unasyn.  If she does not get better soon she may need evaluation for recurrent episode of ischemic colitis.  She is eating and drinking a little bit at home, passing flatus, regular diet is okay for now. - Unasyn per pharmacy - Tylenol 1000 mg every 6 hours - Oxycodone 5 mg every 6 hours as needed - Zofran milligrams daily as needed for nausea  Hypokalemia In setting of decreased p.o. intake.  Will replace with p.o. potassium. - KCl 40 mEq p.o. x 2  Hypertension Blood pressure is normal now, unusual for her even when she is adherent to her meds.  Will hold antihypertensives, probably low in setting of poor solute intake and less than usual p.o. fluid consumption.  Constipation No real bowel  movement in a week per her report.  Does not really feel constipated.  Has some bloating.  Think this could be from poor p.o. intake and probably some slowed transit through irritated gut.  Do not suspect obstruction, since she is passing flatus.  Will start MiraLAX.  Regular diet.  GERD Long history of this, takes daily PPI, which we will continue for admission.

## 2024-02-15 NOTE — ED Provider Notes (Signed)
 [facility]ENC[facility] DEPARTMENT AT [facility] Provider Note   CSN: [id number] Arrival date & time: 02/15/24  4098     History  Chief Complaint  Patient presents with   Abdominal Pain    [name] is a 58 y.o. female.  Patient presents to the emergency department for severe worsening pain secondary to colitis/diverticulitis.  Patient seen in internal medicine clinic 2 days ago with left lower quadrant pain, initiated on treatment for presumed diverticulitis and scheduled for outpatient CT.  CT did get performed yesterday and shows:  Severe colitis involving the mid descending colon through the mid sigmoid level. Numerous diverticula throughout the inflamed segment which could represent a component of superimposed diverticulitis.  Patient contacted by her doctor and told to come to the ED for admission for IV antibiotics and pain control.       Home Medications Prior to Admission medications   Medication Sig Start Date End Date Taking? Authorizing Provider  amLODipine (NORVASC) 10 MG tablet Take 1 tablet (10 mg total) by mouth daily. 04/04/23 04/03/24  Briscoe Burns, MD  amoxicillin-clavulanate (AUGMENTIN) 875-125 MG tablet Take 1 tablet by mouth 2 (two) times daily for 10 days. 02/13/24 02/23/24  Annett Fabian, MD  esomeprazole (NEXIUM) 40 MG capsule Take 1 capsule (40 mg total) by mouth every evening. 09/14/22 10/17/22  Mapp, Gaylyn Cheers, MD  fluticasone (FLONASE) 50 MCG/ACT nasal spray Place 1 spray into both nostrils daily. 04/04/23   Briscoe Burns, MD  hydrocortisone cream 1 % Apply to affected area 2 times daily 02/13/24 02/12/25  Annett Fabian, MD  olmesartan-hydrochlorothiazide (BENICAR HCT) 40-12.5 MG tablet Take 1 tablet by mouth daily. 04/04/23   Briscoe Burns, MD  ondansetron (ZOFRAN) 4 MG tablet Take 1 tablet (4 mg total) by mouth daily as needed for nausea or vomiting. 02/05/24 02/04/25  Annett Fabian, MD  polyethylene glycol (MIRALAX) 17 g packet Mix 1  capful (17 grams) in 8 ounces of fluid and take by mouth daily. 02/13/24   Annett Fabian, MD  rosuvastatin (CRESTOR) 20 MG tablet Take 1 tablet (20 mg total) by mouth daily. 04/04/23   Briscoe Burns, MD      Allergies    Sulfa drugs cross reactors    Review of Systems   Review of Systems  Physical Exam Updated Vital Signs BP (!) 104/59 (BP Location: Right Arm)   Pulse (!) 107   Temp 98.6 F (37 C)   Resp 20   Ht 5\' 2"  (1.575 m)   Wt 100.7 kg   SpO2 95%   BMI 40.60 kg/m  Physical Exam Vitals and nursing note reviewed.  Constitutional:      General: [name] is not in acute distress.    Appearance: [name] is well-developed.  HENT:     Head: Normocephalic and atraumatic.     Mouth/Throat:     Mouth: Mucous membranes are moist.  Eyes:     General: Vision grossly intact. Gaze aligned appropriately.     Extraocular Movements: Extraocular movements intact.     Conjunctiva/sclera: Conjunctivae normal.  Cardiovascular:     Rate and Rhythm: Normal rate and regular rhythm.     Pulses: Normal pulses.     Heart sounds: Normal heart sounds, S1 normal and S2 normal. No murmur heard.    No friction rub. No gallop.  Pulmonary:     Effort: Pulmonary effort is normal. No respiratory distress.     Breath sounds: Normal breath sounds.  Abdominal:  General: Bowel sounds are normal.     Palpations: Abdomen is soft.     Tenderness: There is abdominal tenderness in the left lower quadrant. There is no guarding or rebound.     Hernia: No hernia is present.  Musculoskeletal:        General: No swelling.     Cervical back: Full passive range of motion without pain, normal range of motion and neck supple. No spinous process tenderness or muscular tenderness. Normal range of motion.     Right lower leg: No edema.     Left lower leg: No edema.  Skin:    General: Skin is warm and dry.     Capillary Refill: Capillary refill takes less than 2 seconds.     Findings: No ecchymosis, erythema, rash  or wound.  Neurological:     General: No focal deficit present.     Mental Status: [name] is alert and oriented to person, place, and time.     GCS: GCS eye subscore is 4. GCS verbal subscore is 5. GCS motor subscore is 6.     Cranial Nerves: Cranial nerves 2-12 are intact.     Sensory: Sensation is intact.     Motor: Motor function is intact.     Coordination: Coordination is intact.  Psychiatric:        Attention and Perception: Attention normal.        Mood and Affect: Mood normal.        Speech: Speech normal.        Behavior: Behavior normal.     ED Results / Procedures / Treatments   Labs (all labs ordered are listed, but only abnormal results are displayed) Labs Reviewed  CBC  URINALYSIS, ROUTINE W REFLEX MICROSCOPIC  LIPASE, BLOOD  COMPREHENSIVE METABOLIC PANEL  HCG, SERUM, QUALITATIVE    EKG None  Radiology CT ABDOMEN PELVIS W CONTRAST Result Date: 02/14/2024 CLINICAL DATA:  Left lower quadrant pain EXAM: CT ABDOMEN AND PELVIS WITH CONTRAST TECHNIQUE: Multidetector CT imaging of the abdomen and pelvis was performed using the standard protocol following bolus administration of intravenous contrast. RADIATION DOSE REDUCTION: This exam was performed according to the departmental dose-optimization program which includes automated exposure control, adjustment of the mA and/or kV according to patient size and/or use of iterative reconstruction technique. CONTRAST:  75mL OMNIPAQUE IOHEXOL 350 MG/ML SOLN COMPARISON:  01/24/2018 FINDINGS: Lower chest: No acute abnormality. Hepatobiliary: No focal liver abnormality is seen. No gallstones, gallbladder wall thickening, or biliary dilatation. Pancreas: Unremarkable. No pancreatic ductal dilatation or surrounding inflammatory changes. Spleen: Normal in size without focal abnormality. Adrenals/Urinary Tract: Unremarkable adrenal glands. Kidneys enhance symmetrically without focal lesion, stone, or hydronephrosis. Ureters are nondilated.  Urinary bladder is decompressed. Stomach/Bowel: Stomach within normal limits. No dilated loops of bowel. Normal appendix in the midline of the low pelvis. Colonic diverticulosis. Long segment of moderately thickened colon spanning from the mid descending colon through the mid sigmoid level with associated fat stranding. Numerous prominent diverticula throughout the involved segment. Small amount of adjacent free fluid. No organized pericolonic fluid collections. No extraluminal air. There are numerous (estimated 20-25) hyperdense pills throughout the colonic lumen. Vascular/Lymphatic: Aortic atherosclerosis. No enlarged abdominal or pelvic lymph nodes. Reproductive: Uterus and bilateral adnexa are unremarkable. Other: Small volume fluid within the cul-de-sac. There may be early partial rim enhancement (series 3, image 65), and developing abscess is not excluded. No free intraperitoneal air. Musculoskeletal: No acute or significant osseous findings. IMPRESSION: 1. Severe colitis involving the  mid descending colon through the mid sigmoid level. Numerous diverticula throughout the inflamed segment which could represent a component of superimposed diverticulitis. 2. Small volume fluid within the cul-de-sac. There may be early partial rim enhancement, and developing abscess is not excluded. 3. Numerous (estimated 20-25) hyperdense pills throughout the colon. Correlate with patient's medication history. 4. Aortic atherosclerosis (ICD10-I70.0). These results will be called to the ordering clinician or representative by the Radiologist Assistant, and communication documented in the PACS or [employer]. Electronically Signed   By: Duanne Guess D.O.   On: 02/14/2024 15:11    Procedures Procedures    Medications Ordered in ED Medications  Ampicillin-Sulbactam (UNASYN) 3 g in sodium chloride 0.9 % 100 mL IVPB (has no administration in time range)  sodium chloride 0.9 % bolus 500 mL (has no administration in  time range)  morphine (PF) 4 MG/ML injection 4 mg (has no administration in time range)  ondansetron (ZOFRAN) injection 4 mg (has no administration in time range)    ED Course/ Medical Decision Making/ A&P                                 Medical Decision Making Amount and/or Complexity of Data Reviewed Labs: ordered.   Differential Diagnosis considered includes, but not limited to: Appendicitis; colitis; diverticulitis; bowel obstruction; hernia; cystitis; nephrolithiasis; pyelonephritis.  Patient with known diverticulitis comes to the ED with worsening pain.  Patient had a CT scan performed yesterday that confirms severe colitis, presumably diverticulitis.  Pain is worsened overnight.  [name] does have persistent left lower quadrant tenderness, no frank peritonitis.  [name] is afebrile.  White count is normal at 8.  I do not feel that [name] requires repeat imaging.  Will be given IV Unasyn, IV fluids, pain control.        Final Clinical Impression(s) / ED Diagnoses Final diagnoses:  Diverticulitis    Rx / DC Orders ED Discharge Orders     None         Laquonda Welby, Canary Brim, MD 02/15/24 [phone]

## 2024-02-15 NOTE — Plan of Care (Signed)

## 2024-02-15 NOTE — Consult Note (Addendum)
 Consultation  Referring Provider: medicine service/[facility] Primary Care Physician:  Annett Fabian, MD Primary Gastroenterologist: unassigned.  Reason for Consultation: Left lower quadrant pain, colitis  HPI: [name] is a 58 y.o. female who is being admitted through the emergency room after outpatient CT done yesterday by PCP/medicine service with an acute long segment of colitis. Patient has history of COPD, hypertension, GERD, and obesity.  She had had an admission in 2013 with acute left-sided abdominal pain and rectal bleeding and had undergone colonoscopy per Dr. Dahlia Client at that time. She was found to have a segmental left-sided colitis consistent with ischemic colitis.  Biopsies also showed an acute focal ischemic colitis with no features of chronicity. Patient says she really has not been seen by GI since that time.  She has had occasional twinges of left mid abdominal pain over the past few years but nothing that has been persistent. Over the past month since January 28 she has not felt well.  She says that initially she thought she had a stomach virus because she developed acute abdominal pain primarily on the left side, nausea vomiting and diarrhea.  He had some low-grade fevers at home.  She says most of those symptoms resolved after a few days but the left-sided abdominal pain has gradually worsened.  She says usually her stools are loose and she will have 1-2 bowel movements per day that has been her pattern for many years, she has not noticed any rectal bleeding or melena. Earlier this week she says the pain was getting to the point of being unbearable and she was also having some low-grade fevers at home in the 99-100 range. She underwent CT of the abdomen and pelvis with contrast yesterday that shows a long segment of moderately thickened colon in the mid descending through the mid sigmoid colon with associated fat stranding.  This is in an area where there are also  numerous prominent diverticuli throughout this involved segment.  There is a small amount of adjacent fluid and also some free fluid in the pelvic cul-de-sac with no well-defined rim, cannot rule out early abscess.  Labs today through the ER with WBC 8/4/hematocrit 37.1 UA negative Lipase within normal limits CMET-calcium 3.2/glucose 125 BUN 11/creatinine 1.08 Albumin 3.3 LFTs within normal limits  She has been started on IV Unasyn   Past Medical History:  Diagnosis Date   COPD (chronic obstructive pulmonary disease) (HCC) 01/19/2014   GERD (gastroesophageal reflux disease)    Hypertension     Past Surgical History:  Procedure Laterality Date   COLONOSCOPY  11/19/2012   Procedure: COLONOSCOPY;  Surgeon: Vertell Novak., MD;  Location: [facility] ENDOSCOPY;  Service: Endoscopy;  Laterality: N/A;   TUBAL LIGATION      Prior to Admission medications   Medication Sig Start Date End Date Taking? Authorizing Provider  amLODipine (NORVASC) 10 MG tablet Take 1 tablet (10 mg total) by mouth daily. 04/04/23 04/03/24 Yes Briscoe Burns, MD  amoxicillin-clavulanate (AUGMENTIN) 875-125 MG tablet Take 1 tablet by mouth 2 (two) times daily for 10 days. 02/13/24 02/23/24 Yes Annett Fabian, MD  loratadine (CLARITIN) 10 MG tablet Take 10 mg by mouth daily.   Yes [provider]  olmesartan-hydrochlorothiazide (BENICAR HCT) 40-12.5 MG tablet Take 1 tablet by mouth daily. 04/04/23  Yes Briscoe Burns, MD  omeprazole (PRILOSEC OTC) 20 MG tablet Take 40 mg by mouth daily.   Yes [provider]  ondansetron (ZOFRAN) 4 MG tablet Take 1  tablet (4 mg total) by mouth daily as needed for nausea or vomiting. 02/05/24 02/04/25 Yes Annett Fabian, MD  polyethylene glycol (MIRALAX) 17 g packet Mix 1 capful (17 grams) in 8 ounces of fluid and take by mouth daily. 02/13/24  Yes Annett Fabian, MD  rosuvastatin (CRESTOR) 20 MG tablet Take 1 tablet (20 mg total) by mouth daily. 04/04/23  Yes Briscoe Burns, MD  fluticasone (FLONASE) 50 MCG/ACT nasal spray Place 1 spray into both nostrils daily. Patient not taking: Reported on 02/15/2024 04/04/23   Briscoe Burns, MD  hydrocortisone cream 1 % Apply to affected area 2 times daily Patient not taking: Reported on 02/15/2024 02/13/24 02/12/25  Annett Fabian, MD    Current Facility-Administered Medications  Medication Dose Route Frequency Provider Last Rate Last Admin   acetaminophen (TYLENOL) tablet 1,000 mg  1,000 mg Oral Q6H Marrianne Mood, MD   1,000 mg at 02/15/24 0714   Ampicillin-Sulbactam (UNASYN) 3 g in sodium chloride 0.9 % 100 mL IVPB  3 g Intravenous Q6H Arabella Merles, RPH       enoxaparin (LOVENOX) injection 40 mg  40 mg Subcutaneous Q24H Marrianne Mood, MD       ondansetron [facility]) tablet 4 mg  4 mg Oral Daily PRN Marrianne Mood, MD       oxyCODONE (Oxy IR/ROXICODONE) immediate release tablet 5 mg  5 mg Oral Q6H PRN Marrianne Mood, MD   5 mg at 02/15/24 0913   pantoprazole (PROTONIX) EC tablet 40 mg  40 mg Oral Daily Marrianne Mood, MD   40 mg at 02/15/24 0913   polyethylene glycol (MIRALAX / GLYCOLAX) packet 17 g  17 g Oral Daily Marrianne Mood, MD       potassium chloride SA (KLOR-CON M) CR tablet 40 mEq  40 mEq Oral BID Marrianne Mood, MD   40 mEq at 02/15/24 1914   rosuvastatin (CRESTOR) tablet 20 mg  20 mg Oral Daily Marrianne Mood, MD   20 mg at 02/15/24 7829   Current Outpatient Medications  Medication Sig Dispense Refill   amLODipine (NORVASC) 10 MG tablet Take 1 tablet (10 mg total) by mouth daily. 30 tablet 11   amoxicillin-clavulanate (AUGMENTIN) 875-125 MG tablet Take 1 tablet by mouth 2 (two) times daily for 10 days. 20 tablet 0   loratadine (CLARITIN) 10 MG tablet Take 10 mg by mouth daily.     olmesartan-hydrochlorothiazide (BENICAR HCT) 40-12.5 MG tablet Take 1 tablet by mouth daily. 30 tablet 11   omeprazole (PRILOSEC OTC) 20 MG tablet Take 40 mg by mouth daily.     ondansetron (ZOFRAN) 4 MG  tablet Take 1 tablet (4 mg total) by mouth daily as needed for nausea or vomiting. 30 tablet 0   polyethylene glycol (MIRALAX) 17 g packet Mix 1 capful (17 grams) in 8 ounces of fluid and take by mouth daily. 14 each 0   rosuvastatin (CRESTOR) 20 MG tablet Take 1 tablet (20 mg total) by mouth daily. 30 tablet 11   fluticasone (FLONASE) 50 MCG/ACT nasal spray Place 1 spray into both nostrils daily. (Patient not taking: Reported on 02/15/2024) 16 g 2   hydrocortisone cream 1 % Apply to affected area 2 times daily (Patient not taking: Reported on 02/15/2024) 30 g 1    Allergies as of 02/15/2024 - Review Complete 02/15/2024  Allergen Reaction Noted   Sulfa drugs cross reactors Nausea And Vomiting 12/21/2011    Family History  Problem Relation Age of Onset   Hypertension Mother  Stroke Mother    Hypertension Father    Heart disease Father    COPD Sister    Heart disease Brother    Hypertension Brother    Cancer Maternal Aunt    Cancer Paternal Aunt    Cancer Maternal Grandmother    Heart disease Maternal Grandfather    Hypertension Maternal Grandfather    Heart disease Paternal Grandmother    Diabetes Paternal Grandmother    Heart disease Paternal Grandfather    Hypertension Paternal Grandfather     Social History   Socioeconomic History   Marital status: Married    Spouse name: Not on file   Number of children: 5   Years of education: 10th grade   Highest education level: Not on file  Occupational History   Occupation: unemployed  Tobacco Use   Smoking status: Former    Current packs/day: 0.00    Average packs/day: 1 pack/day for 25.0 years (25.0 ttl pk-yrs)    Types: Cigarettes    Start date: 01/04/1983    Quit date: 01/05/2008    Years since quitting: 16.1   Smokeless tobacco: Never  Vaping Use   Vaping status: Never Used  Substance and Sexual Activity   Alcohol use: No    Alcohol/week: 0.0 standard drinks of alcohol   Drug use: No   Sexual activity: Not on file   Other Topics Concern   Not on file  Social History Narrative   Lives in [place] with husband and extended family.   Social Drivers of Corporate investment banker Strain: Not on file  Food Insecurity: No Food Insecurity (10/23/2022)   Hunger Vital Sign    Worried About Running Out of Food in the Last Year: Never true    Ran Out of Food in the Last Year: Never true  Transportation Needs: No Transportation Needs (10/23/2022)   PRAPARE - Administrator, Civil Service (Medical): No    Lack of Transportation (Non-Medical): No  Physical Activity: Not on file  Stress: Not on file  Social Connections: Not on file  Intimate Partner Violence: Not At Risk (10/23/2022)   Humiliation, Afraid, Rape, and Kick questionnaire    Fear of Current or Ex-Partner: No    Emotionally Abused: No    Physically Abused: No    Sexually Abused: No    Review of Systems: Pertinent positive and negative review of systems were noted in the above HPI section.  All other review of systems was otherwise negative.   Physical Exam: Vital signs in last 24 hours: Temp:  [98.6 F (37 C)-98.7 F (37.1 C)] 98.7 F (37.1 C) (03/01 0914) Pulse Rate:  [70-107] 70 (03/01 1030) Resp:  [14-20] 16 (03/01 1030) BP: (91-104)/(57-63) 91/57 (03/01 1030) SpO2:  [94 %-95 %] 95 % (03/01 1030) Weight:  [100.7 kg] 100.7 kg (03/01 0435)   General:   Alert,  Well-developed, well-nourished, obese older white female pleasant and cooperative in NAD Head:  Normocephalic and atraumatic. Eyes:  Sclera clear, no icterus.   Conjunctiva pink. Ears:  Normal auditory acuity. Nose:  No deformity, discharge,  or lesions. Mouth:  No deformity or lesions.   Neck:  Supple; no masses or thyromegaly. Lungs:  Clear throughout to auscultation.   No wheezes, crackles, or rhonchi.  Heart:  Regular rate and rhythm; no murmurs, clicks, rubs,  or gallops. Abdomen: Obese, soft, she is tender in the left mid and left lower quadrant with some  guarding no rebound bowel sounds are present,  no palpable mass or hepatosplenomegaly    Rectal: Not done Msk:  Symmetrical without gross deformities. . Pulses:  Normal pulses noted. Extremities:  Without clubbing or edema. Neurologic:  Alert and  oriented x4;  grossly normal neurologically. Skin:  Intact without significant lesions or rashes.. Psych:  Alert and cooperative. Normal mood and affect.  Intake/Output from previous day: 02/28 0701 - 03/01 0700 In: 100.2 [IV Piggyback:100.2] Out: -  Intake/Output this shift: No intake/output data recorded.  Lab Results: Recent Labs    02/15/24 0451  WBC 8.0  HGB 12.4  HCT 37.1  PLT 255   BMET Recent Labs    02/15/24 0451  NA 135  K 3.2*  CL 101  CO2 24  GLUCOSE 125*  BUN 11  CREATININE 1.08*  CALCIUM 8.6*   LFT Recent Labs    02/15/24 0451  PROT 6.5  ALBUMIN 3.3*  AST 13*  ALT 10  ALKPHOS 51  BILITOT 1.4*   PT/INR No results for input(s): "LABPROT", "INR" in the last 72 hours. Hepatitis Panel No results for input(s): "HEPBSAG", "HCVAB", "HEPAIGM", "HEPBIGM" in the last 72 hours.    IMPRESSION:  #50 58 year old white female with remote history of left-sided segmental ischemic colitis 2013, who presents with 1 month history of illness initially more consistent with a viral syndrome with nausea vomiting abdominal pain and diarrhea with low-grade fever.  Those symptoms abated but she developed left-sided abdominal pain which has gradually progressed since then.  Intermittent low-grade fevers over the past week and pain to the point of being uncomfortable with walking etc.  CT yesterday shows a long segment of thickened mid descending to sigmoid colon associated fat stranding and multiple prominent diverticuli present throughout this involved segment.  There is also a small amount of adjacent pericolonic fluid and some free fluid in the pelvic cul-de-sac, cannot rule out early abscess.  No leukocytosis  Picture  currently most consistent with acute diverticulitis possible associated SCAD (segmental colitis associated with diverticulosis), there is also some concern for potential microperforation with free fluid in the cul-de-sac which may be an evolving abscess.  #2 morbid obesity #3.  COPD #4.  Hypertension  Plan; agree with IV Unasyn Full liquid diet Repeat labs in a.m. CRP  Will need to be treated as acute diverticulitis, she may need repeat imaging prior to discharge with CT to assure that the diverticulitis is improving and that she has not developed a drainable abscess. She will eventually need colonoscopy but that will likely be done as an outpatient after this acute episode has resolved. GI will follow with you   Amy EsterwoodPA-C  02/15/2024, 11:04 AM   Attending physician's note  I have taken a history, reviewed the chart and examined the patient. I performed a substantive portion of this encounter, including complete performance of at least one of the key components, in conjunction with the APP. I agree with the APP's note, impression and recommendations.   58 year old side segmental colitis consistent with ischemic colitis and also possibly has acute sigmoid uncomplicated diverticulitis No leukocytosis Hemodynamically stable  Short course of antibiotics for 5 to 7 days for possible uncomplicated diverticulitis Continue full liquid diet and advance as tolerated  Will arrange for outpatient GI follow-up and possible colonoscopy in 6 to 8 weeks for follow-up  GI is available if needed, please call with any questions   The patient was provided an opportunity to ask questions and all were answered. The patient agreed with the plan and demonstrated  an understanding of the instructions.  Iona Beard , MD [phone]

## 2024-02-15 NOTE — Progress Notes (Signed)
 Pharmacy Antibiotic Note  [name] is a 58 y.o. female admitted on 02/15/2024 with abdominal pain secondary to colitis/diverticulitis.  Pharmacy has been consulted for Unasyn dosing.  -CT abdomen: Numerous diverticula throughout the inflamed segment; possible component of superimposed diverticulitis + Small volume fluid within the cul-de-sac cannot exclude abscess forming  Plan: -Unasyn 3g IV every 6 hours  -Monitor renal function -Follow up signs of clinical improvement, LOT, de-escalation of antibiotics   Height: 5\' 2"  (157.5 cm) Weight: 100.7 kg (222 lb) IBW/kg (Calculated) : 50.1  Temp (24hrs), Avg:98.6 F (37 C), Min:98.6 F (37 C), Max:98.6 F (37 C)  Recent Labs  Lab 02/15/24 0451  WBC 8.0  CREATININE 1.08*    Estimated Creatinine Clearance: 63.8 mL/min (A) (by C-G formula based on SCr of 1.08 mg/dL (H)).    Allergies  Allergen Reactions   Sulfa Drugs Cross Reactors Nausea And Vomiting    "made me sick- I had to have IVs"    Antimicrobials this admission: Unasyn 3/1 >>   Thank you for allowing pharmacy to be a part of this patient's care.  Arabella Merles, PharmD. Clinical Pharmacist 02/15/2024 6:47 AM

## 2024-02-16 DIAGNOSIS — K529 Noninfective gastroenteritis and colitis, unspecified: Secondary | ICD-10-CM | POA: Diagnosis not present

## 2024-02-16 DIAGNOSIS — K5792 Diverticulitis of intestine, part unspecified, without perforation or abscess without bleeding: Principal | ICD-10-CM

## 2024-02-16 LAB — CBC
HCT: 34.5 % — ABNORMAL LOW (ref 36.0–46.0)
Hemoglobin: 11.3 g/dL — ABNORMAL LOW (ref 12.0–15.0)
MCH: 29 pg (ref 26.0–34.0)
MCHC: 32.8 g/dL (ref 30.0–36.0)
MCV: 88.7 fL (ref 80.0–100.0)
Platelets: 225 10*3/uL (ref 150–400)
RBC: 3.89 MIL/uL (ref 3.87–5.11)
RDW: 12.7 % (ref 11.5–15.5)
WBC: 5.5 10*3/uL (ref 4.0–10.5)
nRBC: 0 % (ref 0.0–0.2)

## 2024-02-16 LAB — BASIC METABOLIC PANEL
Anion gap: 12 (ref 5–15)
BUN: 8 mg/dL (ref 6–20)
CO2: 24 mmol/L (ref 22–32)
Calcium: 8.8 mg/dL — ABNORMAL LOW (ref 8.9–10.3)
Chloride: 104 mmol/L (ref 98–111)
Creatinine, Ser: 0.81 mg/dL (ref 0.44–1.00)
GFR, Estimated: >60 mL/min (ref >60)
Glucose, Bld: 112 mg/dL — ABNORMAL HIGH (ref 70–99)
Potassium: 4.1 mmol/L (ref 3.5–5.1)
Sodium: 140 mmol/L (ref 135–145)

## 2024-02-16 LAB — MAGNESIUM: Magnesium: 2 mg/dL (ref 1.7–2.4)

## 2024-02-16 NOTE — Plan of Care (Signed)
   Problem: Education: Goal: Knowledge of General Education information will improve Description: Including pain rating scale, medication(s)/side effects and non-pharmacologic comfort measures Outcome: Progressing   Problem: Pain Managment: Goal: General experience of comfort will improve and/or be controlled Outcome: Progressing   Problem: Safety: Goal: Ability to remain free from injury will improve Outcome: Progressing

## 2024-02-16 NOTE — Progress Notes (Addendum)
 Patient ID: [name], female   DOB: 08-Dec-1966, 58 y.o.   MRN: [id number]    Progress Note   Subjective   Day # 2 CC; left lower quadrant pain, abnormal CT  IV Unasyn day 2  Labs today-WBC 5.5/hemoglobin 11.3/hematocrit 34.5 Potassium 4.1/BUN 8/creatinine 0.81 CRP 11.1  Patient says she is feeling a bit better still having pain but not as severe as on admission, no bowel movements    Objective   Vital signs in last 24 hours: Temp:  [98.1 F (36.7 C)-98.7 F (37.1 C)] 98.1 F (36.7 C) (03/02 0518) Pulse Rate:  [70-79] 79 (03/02 0518) Resp:  [16-17] 17 (03/02 0518) BP: (88-101)/(51-70) 100/60 (03/02 0518) SpO2:  [94 %-99 %] 99 % (03/02 0518)   General:    white female in NAD Heart:  Regular rate and rhythm; no murmurs Lungs: Respirations even and unlabored, lungs CTA bilaterally Abdomen:  Soft, remains quite tender in the right mid quadrant no rebound. Normal bowel sounds. Extremities:  Without edema. Neurologic:  Alert and oriented,  grossly normal neurologically. Psych:  Cooperative. Normal mood and affect.  Intake/Output from previous day: 03/01 0701 - 03/02 0700 In: 610 [P.O.:610] Out: -  Intake/Output this shift: No intake/output data recorded.  Lab Results: Recent Labs    02/15/24 0451 02/16/24 0657  WBC 8.0 5.5  HGB 12.4 11.3*  HCT 37.1 34.5*  PLT 255 225   BMET Recent Labs    02/15/24 0451 02/16/24 0657  NA 135 140  K 3.2* 4.1  CL 101 104  CO2 24 24  GLUCOSE 125* 112*  BUN 11 8  CREATININE 1.08* 0.81  CALCIUM 8.6* 8.8*   LFT Recent Labs    02/15/24 0451  PROT 6.5  ALBUMIN 3.3*  AST 13*  ALT 10  ALKPHOS 51  BILITOT 1.4*   PT/INR No results for input(s): "LABPROT", "INR" in the last 72 hours.  Studies/Results: CT ABDOMEN PELVIS W CONTRAST Result Date: 02/14/2024 CLINICAL DATA:  Left lower quadrant pain EXAM: CT ABDOMEN AND PELVIS WITH CONTRAST TECHNIQUE: Multidetector CT imaging of the abdomen and pelvis was performed using  the standard protocol following bolus administration of intravenous contrast. RADIATION DOSE REDUCTION: This exam was performed according to the departmental dose-optimization program which includes automated exposure control, adjustment of the mA and/or kV according to patient size and/or use of iterative reconstruction technique. CONTRAST:  75mL OMNIPAQUE IOHEXOL 350 MG/ML SOLN COMPARISON:  01/24/2018 FINDINGS: Lower chest: No acute abnormality. Hepatobiliary: No focal liver abnormality is seen. No gallstones, gallbladder wall thickening, or biliary dilatation. Pancreas: Unremarkable. No pancreatic ductal dilatation or surrounding inflammatory changes. Spleen: Normal in size without focal abnormality. Adrenals/Urinary Tract: Unremarkable adrenal glands. Kidneys enhance symmetrically without focal lesion, stone, or hydronephrosis. Ureters are nondilated. Urinary bladder is decompressed. Stomach/Bowel: Stomach within normal limits. No dilated loops of bowel. Normal appendix in the midline of the low pelvis. Colonic diverticulosis. Long segment of moderately thickened colon spanning from the mid descending colon through the mid sigmoid level with associated fat stranding. Numerous prominent diverticula throughout the involved segment. Small amount of adjacent free fluid. No organized pericolonic fluid collections. No extraluminal air. There are numerous (estimated 20-25) hyperdense pills throughout the colonic lumen. Vascular/Lymphatic: Aortic atherosclerosis. No enlarged abdominal or pelvic lymph nodes. Reproductive: Uterus and bilateral adnexa are unremarkable. Other: Small volume fluid within the cul-de-sac. There may be early partial rim enhancement (series 3, image 65), and developing abscess is not excluded. No free intraperitoneal air. Musculoskeletal: No acute  or significant osseous findings. IMPRESSION: 1. Severe colitis involving the mid descending colon through the mid sigmoid level. Numerous diverticula  throughout the inflamed segment which could represent a component of superimposed diverticulitis. 2. Small volume fluid within the cul-de-sac. There may be early partial rim enhancement, and developing abscess is not excluded. 3. Numerous (estimated 20-25) hyperdense pills throughout the colon. Correlate with patient's medication history. 4. Aortic atherosclerosis (ICD10-I70.0). These results will be called to the ordering clinician or representative by the Radiologist Assistant, and communication documented in the PACS or [employer]. Electronically Signed   By: Duanne Guess D.O.   On: 02/14/2024 15:11       Assessment / Plan:    #38 58 year old white female with remote history of left sided segmental ischemic colitis 2013 who presents now with 1 month history of illness with nausea vomiting abdominal pain and diarrhea with low-grade fever at outset followed by persistent left-sided abdominal pain which is gradually progressed along with intermittent continued fevers.  CT shows a long segment of thickened mid descending to sigmoid colon with associated fat stranding and multiple prominent diverticuli present throughout this involved segment, there is also a small amount of adjacent pericolonic fluid and some free fluid in the cul-de-sac cannot rule out early abscess  CRP elevated, no leukocytosis  Picture most consistent with acute diverticulitis, and possible associated as SCAD  Can Not rule out microperforation with early abscess  Patient doing well on Unasyn  Plan; would continue course of IV Unasyn for at least another 24 to 48 hours, if continued proven in symptoms then convert to oral antibiotics Consider repeat imaging prior to discharge to rule out abscess  Arrange for outpatient follow-up with GI/DR Ashanta Amoroso  in the office, and once she has resolved this acute episode will need colonoscopy.  Okay for regular diet as tolerates  Principal Problem:   Colitis Active  Problems:   Essential hypertension   GERD (gastroesophageal reflux disease)   Obesity, Class III, BMI 40-49.9 (morbid obesity) (HCC)   Constipation   Hypokalemia     LOS: 0 days   Amy Esterwood  02/16/2024, 9:13 AM   Attending physician's note   I have taken a history, reviewed the chart and examined the patient. I performed a substantive portion of this encounter, including complete performance of at least one of the key components, in conjunction with the APP. I agree with the APP's note, impression and recommendations.    Abdominal pain is improving Continue IV antibiotics during hospitalization, transition to oral antibiotics to complete  10-day course Repeat CT abdomen pelvis in 4 weeks to document resolution of acute diverticulitis Will arrange GI office follow-up and outpatient colonoscopy in 6 to 8 weeks  Inpatient GI signing off, available for any questions   The patient was provided an opportunity to ask questions and all were answered. The patient agreed with the plan and demonstrated an understanding of the instructions.   Iona Beard , MD [phone]

## 2024-02-16 NOTE — Progress Notes (Signed)
 HD#0 SUBJECTIVE:  Patient Summary: [name] is a 58 y.o. with a pertinent PMH of HTN, GERD, HLD, prior hematochezia/ischemic colitis in 2013, who presented with abdominal pain and admitted for severe diverticulitis.   Overnight Events: NAEON  Interim History: [name] appears well this morning and is ambulating around her room without difficulty. She states her abdominal pain has resolved and that she is eager to try a bland diet this morning. No complaints/concerns.  OBJECTIVE:  Vital Signs: Vitals:   02/15/24 1030 02/15/24 1045 02/15/24 1553 02/16/24 0518  BP: (!) 91/57 101/70 91/64 100/60  Pulse: 70 77 77 79  Resp: 16  16 17   Temp:   98.5 F (36.9 C) 98.1 F (36.7 C)  TempSrc:   Oral Oral  SpO2: 95% 97% 96% 99%  Weight:      Height:       Supplemental O2: Room Air SpO2: 99 %  Filed Weights   02/15/24 0435  Weight: 100.7 kg    Intake/Output Summary (Last 24 hours) at 02/16/2024 0809 Last data filed at 02/15/2024 2206 Gross per 24 hour  Intake 610 ml  Output --  Net 610 ml   Net IO Since Admission: 710.23 mL [02/16/24 0809]  Physical Exam: Constitutional:Well appearing, resting in bed. In no acute distress. Cardio:Regular rate and rhythm. No murmurs, rubs, or gallops. Pulm: Normal work of breathing on room air. Abdomen:Soft, nontender, nondistended. WUJ:[id number] for extremity edema. Skin:Warm and dry. Neuro:Alert and oriented x3. No focal deficit noted. Psych:Pleasant mood and affect.  Patient Lines/Drains/Airways Status     Active Line/Drains/Airways     Name Placement date Placement time Site Days   Peripheral IV 02/15/24 20 G Left Antecubital 02/15/24  0514  Antecubital  1            ASSESSMENT/PLAN:  Assessment: Principal Problem:   Colitis Active Problems:   Essential hypertension   GERD (gastroesophageal reflux disease)   Obesity, Class III, BMI 40-49.9 (morbid obesity) (HCC)   Constipation   Hypokalemia  [name] is a 58 y.o.  with a pertinent PMH of HTN, GERD, HLD, prior hematochezia/ischemic colitis in 2013, who presented with abdominal pain and admitted for severe diverticulitis.   Plan: Acute diverticulitis C/f acute segmental colitis associated with diverticulitis Treating as acute diverticulitis, though GI notes that microperforation with early abscess cannot be ruled out. CRP elevated to 11.1. Day 2 of unasyn with subjective improvement of symptoms including pain, no nausea or vomiting. She is eager to attempt a soft diet today. Remains afebrile without leukocytosis. Plan: -GI consulted, appreciate their recommendations  -May need repeat imaging prior to discharge to rule out abscess  -Will need OP follow up and eventually colonoscopy -Continue unasyn, dosing per pharmacy  -Additional 24-48h of IV therapy; consider transitioning to PO based on clinical course at that time -Soft diet today; ADAT -Pain control with tylenol 1000 mg q6h PRN mild-moderate pain, oxycodone 5 mg q6h PRN severe pain -Zofran 4 mg daily PRN nausea, vomiting  Hx hypertension Normotensive to hypotensive since arrival. S/p 1.5 L IVF. Plan: -Continue to hold home amlodipine 10 mg daily, olmesartan-hydrochlorothiazide 40-12.5 mg daily -Encourage PO intake as tolerated   HLD Plan: -Continue home rosuvastatin 20 mg daily  Constipation Has not had a BM in several days but also has not had much to eat in this time, either. Not having abdominal pain at this point and abdomen is soft and nontender. Plan: -Miralax 17 g daily  Best Practice: Diet: Soft;  ADAT IVF: None VTE: enoxaparin (LOVENOX) injection 40 mg Start: 02/15/24 1600 Code: Full AB: Unasyn DISPO: Anticipated discharge in 1-2 days to Home pending IV antibiotics and improvement of clinical status.  Signature: Champ Mungo, D.O.  Internal Medicine Resident, PGY-3 [facility]   Please contact the on call pager after 5 pm and on weekends at  [phone].

## 2024-02-17 ENCOUNTER — Other Ambulatory Visit: Payer: Self-pay

## 2024-02-17 ENCOUNTER — Other Ambulatory Visit: Payer: Self-pay | Admitting: Student

## 2024-02-17 ENCOUNTER — Other Ambulatory Visit (HOSPITAL_COMMUNITY): Payer: Self-pay

## 2024-02-17 ENCOUNTER — Encounter (HOSPITAL_COMMUNITY): Payer: Self-pay

## 2024-02-17 ENCOUNTER — Telehealth: Payer: Self-pay

## 2024-02-17 DIAGNOSIS — K529 Noninfective gastroenteritis and colitis, unspecified: Secondary | ICD-10-CM | POA: Diagnosis not present

## 2024-02-17 LAB — BASIC METABOLIC PANEL
Anion gap: 8 (ref 5–15)
BUN: 6 mg/dL (ref 6–20)
CO2: 23 mmol/L (ref 22–32)
Calcium: 8.7 mg/dL — ABNORMAL LOW (ref 8.9–10.3)
Chloride: 108 mmol/L (ref 98–111)
Creatinine, Ser: 0.84 mg/dL (ref 0.44–1.00)
GFR, Estimated: >60 mL/min (ref >60)
Glucose, Bld: 106 mg/dL — ABNORMAL HIGH (ref 70–99)
Potassium: 3.9 mmol/L (ref 3.5–5.1)
Sodium: 139 mmol/L (ref 135–145)

## 2024-02-17 LAB — CBC
HCT: 37.2 % (ref 36.0–46.0)
Hemoglobin: 12.4 g/dL (ref 12.0–15.0)
MCH: 29.4 pg (ref 26.0–34.0)
MCHC: 33.3 g/dL (ref 30.0–36.0)
MCV: 88.2 fL (ref 80.0–100.0)
Platelets: 240 10*3/uL (ref 150–400)
RBC: 4.22 MIL/uL (ref 3.87–5.11)
RDW: 12.7 % (ref 11.5–15.5)
WBC: 4.6 10*3/uL (ref 4.0–10.5)
nRBC: 0 % (ref 0.0–0.2)

## 2024-02-17 LAB — C-REACTIVE PROTEIN: CRP: 4.8 mg/dL — ABNORMAL HIGH (ref <1.0)

## 2024-02-17 MED ORDER — AMOXICILLIN-POT CLAVULANATE 875-125 MG PO TABS
1.0000 | ORAL_TABLET | Freq: Two times a day (BID) | ORAL | Status: DC
  Administered 2024-02-17: 1 via ORAL
  Filled 2024-02-17: qty 1

## 2024-02-17 MED ORDER — AMOXICILLIN-POT CLAVULANATE 875-125 MG PO TABS
1.0000 | ORAL_TABLET | Freq: Two times a day (BID) | ORAL | 0 refills | Status: AC
  Filled 2024-02-17 – 2024-02-20 (×3): qty 21, 11d supply, fill #0

## 2024-02-17 MED ORDER — ACETAMINOPHEN 500 MG PO TABS
1000.0000 mg | ORAL_TABLET | Freq: Four times a day (QID) | ORAL | 0 refills | Status: DC
  Filled 2024-02-17: qty 30, 4d supply, fill #0

## 2024-02-17 NOTE — Progress Notes (Signed)
 Chaplain responded to a request to provide information about the Advance Care Directives form. Pt received the information needed to fill the form. Chaplin asked Pt to be notified when the form is ready for notarization.    02/17/24 1028  Spiritual Encounters  Type of Visit Initial  Care provided to: Patient  Referral source Clinical staff  Reason for visit Advance directives  OnCall Visit No   Oneida Alar Chaplain Resident

## 2024-02-17 NOTE — Telephone Encounter (Signed)
-----   Message from Mike Gip sent at 02/16/2024  1:33 PM EST ----- Regarding: office follow up etc [name], new patient hospitalized with diverticulitis/segmental colitis and will be [place] pt   She will need a colonoscopy in about 6 weeks, hopefully can get an office visit before that time with app or Dr. Lavon Paganini  She will probably be discharged Monday or Tuesday so you can wait until then to contact her thank you

## 2024-02-17 NOTE — Discharge Summary (Signed)
 Name: [name] MRN: [id number] DOB: Mar 03, 1966 58 y.o. PCP: Annett Fabian, MD  Date of Admission: 02/15/2024  4:29 AM Date of Discharge: 02/17/2024 12:07 PM Attending Physician: Dr. Antony Contras  Discharge Diagnosis: Principal Problem:   Colitis Active Problems:   Essential hypertension   GERD (gastroesophageal reflux disease)   Obesity, Class III, BMI 40-49.9 (morbid obesity) (HCC)   Constipation   Hypokalemia   Diverticulitis    Discharge Medications: Allergies as of 02/17/2024       Reactions   Sulfa Drugs Cross Reactors Nausea And Vomiting   "made me sick- I had to have IVs"        Medication List     PAUSE taking these medications    olmesartan-hydrochlorothiazide 40-12.5 MG tablet Wait to take this until your doctor or other care provider tells you to start again. Commonly known as: BENICAR HCT Take 1 tablet by mouth daily.       STOP taking these medications    fluticasone 50 MCG/ACT nasal spray Commonly known as: FLONASE   hydrocortisone cream 1 %   ondansetron 4 MG tablet Commonly known as: Zofran       TAKE these medications    acetaminophen 500 MG tablet Commonly known as: TYLENOL Take 2 tablets (1,000 mg total) by mouth every 6 (six) hours.   amLODipine 10 MG tablet Commonly known as: NORVASC Take 1 tablet (10 mg total) by mouth daily.   amoxicillin-clavulanate 875-125 MG tablet Commonly known as: AUGMENTIN Take 1 tablet by mouth 2 (two) times daily for 11 days. Take the first dose this evening What changed: additional instructions   loratadine 10 MG tablet Commonly known as: CLARITIN Take 10 mg by mouth daily.   omeprazole 20 MG tablet Commonly known as: PRILOSEC OTC Take 40 mg by mouth daily.   PEG 3350 17 g Pack Commonly known as: MiraLax Mix 1 capful (17 grams) in 8 ounces of fluid and take by mouth daily.   rosuvastatin 20 MG tablet Commonly known as: CRESTOR Take 1 tablet (20 mg total) by mouth daily.         Disposition and follow-up:   [name] was discharged from [facility] in Good condition.  At the hospital follow up visit please address:  1.  Follow-up:  *Acute Diverticulitis  -Please schedule an abdominal/pelvis CT for 4 weeks from now -Ensure patient has GI appointment scheduled, she will need a colonoscopy in 6-8 weeks form now  -Patient was started on Augmentin, will be on this until 3/14   *HTN -Olmesartan-hydrochlorothiazide was held, restart when clinically appropriate    *Constipation  -Assess if patient had BM yet, she was instructed to use miralax    2.  Labs / imaging needed at time of follow-up: BMP, CT scan (4 weeks from now of abdomen and pelvis)  3.  Pending labs/ test needing follow-up: N/A  4.  Medication Changes  STOPPED  -Olmesartan-hydrochlorothiazide -Zofran -Hydrocortisone cream  -Flonase    ADDED  -Augmentin BID   -Tylenol     MODIFIED  -N/A  Hospital Course by problem list: Acute colitis Acute Diverticulitis  Marked inflammation involving mid descending colon through mid sigmoid level per CT scan with numerous diverticula throughout. Patient presented to the ED after being on Augmentin for 1.5 days with worsening abdominal pain. In the ED, she was started on IV Unasyn and GI was consulted due to her history of ischemic colitis in 2013. Patient's clinical picture was more  consistent with acute diverticulitis. After starting the IV ABX, she improved clinically. She was discharged home with a course of Augmentin for a total course of 14 days, and with instructions to follow up with GI. She will need a CT scan ordered for a CT of her abdomen and pelvis in 4 weeks and a coloscopy with GI in 6-8 weeks pending results of follow up CT scan.   Hypokalemia In setting of decreased p.o. intake. Replaced with p.o. potassium.  Hypertension Blood pressure is normal now, unusual for her even when she is adherent to her meds.   Probably low in setting of poor solute intake and less than usual p.o. fluid consumption. Patient's BP remained normotensive throughout hospitalization, she was instructed to restart amlodipine and continue to hold the olmesartan-hydrochlorothiazide. She required 1.5L IVF for hypotension we admitted her on 3/1.   Constipation Has not had a BM in several days but also has not had much to eat in this time, either. Not having abdominal pain at this point and abdomen is soft and nontender. She was instructed to use miralax.   GERD Long history of this, takes daily PPI, which we continued.   HLD Continued home rosuvastatin 20 mg daily   Discharge Subjective: Patient is feeling well today. She no longer has abdominal pain, nausea, or vomiting. She has not had a BM yet and understands to use miralax up to twice a day for a BM. We reviewed the importance of following up with GI and obtaining a CT scan in 4 weeks.   Discharge Exam:   Blood pressure 113/70, pulse 77, temperature 98.1 F (36.7 C), temperature source Oral, resp. rate 16, height 5\' 2"  (1.575 m), weight 100.7 kg, SpO2 98%.  Constitutional:well-appearing , sitting in bed, in no acute distress Cardiovascular: regular rate and rhythm, no m/r/g Pulmonary/Chest: normal work of breathing on room air Abdominal: soft, non-tender, non-distended, bowel sounds present  Neurological: alert & awake, participating in conversation  Skin: warm and dry Psych: Normal mood and affect  Pertinent Labs, Studies, and Procedures:     Latest Ref Rng & Units 02/17/2024    6:46 AM 02/16/2024    6:57 AM 02/15/2024    4:51 AM  CBC  WBC 4.0 - 10.5 K/uL 4.6  5.5  8.0   Hemoglobin 12.0 - 15.0 g/dL 10.2  72.5  36.6   Hematocrit 36.0 - 46.0 % 37.2  34.5  37.1   Platelets 150 - 400 K/uL 240  225  255        Latest Ref Rng & Units 02/17/2024    6:46 AM 02/16/2024    6:57 AM 02/15/2024    4:51 AM  CMP  Glucose 70 - 99 mg/dL 440  347  425   BUN 6 - 20 mg/dL 6  8  11     Creatinine 0.44 - 1.00 mg/dL 9.56  3.87  5.64   Sodium 135 - 145 mmol/L 139  140  135   Potassium 3.5 - 5.1 mmol/L 3.9  4.1  3.2   Chloride 98 - 111 mmol/L 108  104  101   CO2 22 - 32 mmol/L 23  24  24    Calcium 8.9 - 10.3 mg/dL 8.7  8.8  8.6   Total Protein 6.5 - 8.1 g/dL   6.5   Total Bilirubin 0.0 - 1.2 mg/dL   1.4   Alkaline Phos 38 - 126 U/L   51   AST 15 - 41 U/L   13  ALT 0 - 44 U/L   10     No results found.   Discharge Instructions: Discharge Instructions     Call MD for:  difficulty breathing, headache or visual disturbances   Complete by: As directed    Call MD for:  extreme fatigue   Complete by: As directed    Call MD for:  hives   Complete by: As directed    Call MD for:  persistant dizziness or light-headedness   Complete by: As directed    Call MD for:  persistant nausea and vomiting   Complete by: As directed    Call MD for:  redness, tenderness, or signs of infection (pain, swelling, redness, odor or green/yellow discharge around incision site)   Complete by: As directed    Call MD for:  severe uncontrolled pain   Complete by: As directed    Call MD for:  temperature >100.4   Complete by: As directed    Diet - low sodium heart healthy   Complete by: As directed    Discharge instructions   Complete by: As directed    You came to the hospital for abdominal pain and you were diagnosed with Acute diverticulitis .  We treated you with IV antibiotics.    *For your Acute diverticulitis -We have started you on these following medications:  -Augmentin: take one tablet this evening, then take every morning and evening: You last day on this medication will be 3/14     -Please see PCP on March 6th at 1:15pm  -You will need to follow up with GI in the next 2-4 weeks -We will order a CT scan, this will need to be scheduled 4 weeks from now   *We have started the following medication:  -Tylenol: take 1,000 mg every 6 hours for pain as needed, do not take more  than 4,000 mg in 24 hours   *We have stopped the following medication: -Olmesartan-hydrochlorothiazide -zofran -Hydrocortisone cream  -Flonase    If you have any questions or concerns please feel free to call: Internal medicine clinic at [phone]   If you have any of these following symptoms, please call us or seek care at an emergency department: -Chest Pain -Difficulty Breathing -Worsening abdominal  pain -Syncope (passing out) -Drooping of face -Slurred speech -Sudden weakness in your leg or arm -Fever -Chills   We are glad that you are feeling better, it was a pleasure to care for you!  Faith Rogue DO   Increase activity slowly   Complete by: As directed        Signed: Faith Rogue DO [facility] - PGY1 Pager: [phone] 02/17/2024, 12:07 PM    Please contact the on call pager after 5 pm and on weekends at [phone].

## 2024-02-17 NOTE — Discharge Instructions (Signed)
 You came to the hospital for abdominal pain and you were diagnosed with Acute diverticulitis .  We treated you with IV antibiotics.    *For your Acute diverticulitis -We have started you on these following medications:  -Augmentin: take one tablet this evening, then take every morning and evening: You last day on this medication will be 3/14     -Please see PCP on March 6th at 1:15pm  -You will need to follow up with GI in the next 2-4 weeks -We will order a CT scan, this will need to be scheduled 4 weeks from now   *We have started the following medication:  -Tylenol: take 1,000 mg every 6 hours for pain as needed, do not take more than 4,000 mg in 24 hours   *We have stopped the following medication: -Olmesartan-hydrochlorothiazide -zofran -Hydrocortisone cream  -Flonase    If you have any questions or concerns please feel free to call: Internal medicine clinic at [phone]   If you have any of these following symptoms, please call us or seek care at an emergency department: -Chest Pain -Difficulty Breathing -Worsening abdominal  pain -Syncope (passing out) -Drooping of face -Slurred speech -Sudden weakness in your leg or arm -Fever -Chills   We are glad that you are feeling better, it was a pleasure to care for you!  Faith Rogue DO

## 2024-02-17 NOTE — Telephone Encounter (Signed)
 Appointment 04/01/24 at 10:00 am. Letter mailed.

## 2024-02-18 ENCOUNTER — Telehealth: Payer: Self-pay | Admitting: *Deleted

## 2024-02-18 ENCOUNTER — Other Ambulatory Visit (HOSPITAL_COMMUNITY): Payer: Self-pay

## 2024-02-18 NOTE — Telephone Encounter (Signed)
 Mammogram scheduled by breast center  March 6.2025 @ 1:25 pm

## 2024-02-20 ENCOUNTER — Other Ambulatory Visit (HOSPITAL_COMMUNITY): Payer: Self-pay

## 2024-02-20 ENCOUNTER — Ambulatory Visit
Admission: RE | Admit: 2024-02-20 | Discharge: 2024-02-20 | Disposition: A | Discharge status: Home / Self Care | Source: Ambulatory Visit | Attending: Student in an Organized Health Care Education/Training Program | Admitting: Student in an Organized Health Care Education/Training Program

## 2024-02-20 ENCOUNTER — Ambulatory Visit: Admitting: Student

## 2024-02-20 ENCOUNTER — Encounter: Payer: Self-pay | Admitting: Student

## 2024-02-20 VITALS — BP 132/78 | HR 75 | Temp 98.4°F | Ht 62.0 in | Wt 224.1 lb

## 2024-02-20 DIAGNOSIS — K5792 Diverticulitis of intestine, part unspecified, without perforation or abscess without bleeding: Secondary | ICD-10-CM | POA: Diagnosis present

## 2024-02-20 DIAGNOSIS — E876 Hypokalemia: Secondary | ICD-10-CM

## 2024-02-20 DIAGNOSIS — Z1239 Encounter for other screening for malignant neoplasm of breast: Secondary | ICD-10-CM

## 2024-02-20 DIAGNOSIS — I10 Essential (primary) hypertension: Secondary | ICD-10-CM | POA: Diagnosis not present

## 2024-02-20 NOTE — Assessment & Plan Note (Signed)
 BP Readings from Last 3 Encounters:  02/20/24 132/78  02/17/24 113/70  02/13/24 126/68    Blood pressure is well-controlled.  She is currently taking amlodipine 10 mg daily.  Her olmesartan-hydrochlorothiazide was held in the hospital due to hypotension.  Since her blood pressure is normal on just amlodipine, we will continue with that for now and I have discontinued her other blood pressure medication.

## 2024-02-20 NOTE — Progress Notes (Signed)
     CC: Hospital Follow Up after admission from 02/15/2024 to 02/17/2024 for acutr diverticulitis  HPI:  [name] is a 58 y.o. female with the PMH listed below who presents to the clinic for hospital follow up. Please see assessment and plan below for further details.  Past Medical History:  Diagnosis Date   COPD (chronic obstructive pulmonary disease) (HCC) 01/19/2014   GERD (gastroesophageal reflux disease)    Hypertension     Review of Systems:   Pertinent items noted in HPI and/or A&P.  Physical Exam:  Vitals:   02/20/24 1316  BP: 132/78  Pulse: 75  Temp: 98.4 F (36.9 C)  TempSrc: Oral  SpO2: 99%  Weight: 224 lb 1.6 oz (101.7 kg)  Height: 5\' 2"  (1.575 m)   Well-appearing female no acute distress Lungs clear to auscultation bilaterally Heart rate and rhythm regular, no murmurs Abdomen soft, nontender, nondistended No focal skin or neurological concerns.  Assessment & Plan:   Diverticulitis She was recently hospitalized from 3/1 to 3/3 for colitis with superimposed acute diverticulitis.  She was given IV antibiotics and evaluated by the gastroenterologist.  Her pain improved and she was deemed stable for discharge with outpatient follow-up.  She is here today for hospital follow-up.  Overall she is doing really well.  Says her symptoms are totally resolved.  She says she feels better than she has in a long time.  She has been able to have regular bowel movements and does not have any abdominal pain.   She has a follow-up appointment with gastroenterology on 4/16 for colonoscopy.  She will also need a repeat CT abdomen/pelvis in 1 month prior to that appointment, so I have placed that order.  She will continue on Augmentin until 3/14.  She may continue using MiraLAX as needed if she has any constipation.  We will recheck a BMP today.  Essential hypertension BP Readings from Last 3 Encounters:  02/20/24 132/78  02/17/24 113/70  02/13/24 126/68    Blood pressure  is well-controlled.  She is currently taking amlodipine 10 mg daily.  Her olmesartan-hydrochlorothiazide was held in the hospital due to hypotension.  Since her blood pressure is normal on just amlodipine, we will continue with that for now and I have discontinued her other blood pressure medication.    Patient discussed with Dr. Synthia Innocent, MD [facility] Internal [facility] Resident PGY-1 Clinic Phone: [phone] Pager: [phone]

## 2024-02-20 NOTE — Assessment & Plan Note (Addendum)
 She was recently hospitalized from 3/1 to 3/3 for colitis with superimposed acute diverticulitis.  She was given IV antibiotics and evaluated by the gastroenterologist.  Her pain improved and she was deemed stable for discharge with outpatient follow-up.  She is here today for hospital follow-up.  Overall she is doing really well.  Says her symptoms are totally resolved.  She says she feels better than she has in a long time.  She has been able to have regular bowel movements and does not have any abdominal pain.   She has a follow-up appointment with gastroenterology on 4/16 for colonoscopy.  She will also need a repeat CT abdomen/pelvis in 1 month prior to that appointment, so I have placed that order.  She will continue on Augmentin until 3/14.  She may continue using MiraLAX as needed if she has any constipation.  We will recheck a BMP today.

## 2024-02-20 NOTE — Patient Instructions (Signed)
 Thank you, Ms.Oren Bracket for allowing [place] to provide your care today.   I am so glad you are doing better.  Please keep taking the Augmentin antibiotic until 3/14 as it was prescribed.  I have placed an order for a repeat abdominal and pelvis CT scan 1 month from now before you get your colonoscopy as the gastroenterologist recommended.  Your blood pressure is normal today.  Please keep taking the amlodipine, but you do not need to take the olmesartan-hydrochlorothiazide.  We will collect a basic metabolic panel today.  I will contact you as soon as I have the results.  I have ordered the following labs for you:   Lab Orders         BMP8+Anion Gap      I have ordered the following medication/changed the following medications:   Stop the following medications: Medications Discontinued During This Encounter  Medication Reason   olmesartan-hydrochlorothiazide (BENICAR HCT) 40-12.5 MG tablet Change in therapy     Start the following medications: No orders of the defined types were placed in this encounter.    Follow up: 3 months   We look forward to seeing you next time. Please call our clinic at [phone] if you have any questions or concerns. The best time to call is Monday-Friday from 9am-4pm, but there is someone available 24/7. If after hours or the weekend, call the main hospital number and ask for the Internal Medicine Resident On-Call. If you need medication refills, please notify your pharmacy one week in advance and they will send [place] a request.   Thank you for trusting me with your care. Wishing you the best!   Annett Fabian, MD [facility]

## 2024-02-21 LAB — BMP8+ANION GAP
Anion Gap: 20 mmol/L — ABNORMAL HIGH (ref 10.0–18.0)
BUN/Creatinine Ratio: 13 (ref 9–23)
BUN: 11 mg/dL (ref 6–24)
CO2: 21 mmol/L (ref 20–29)
Calcium: 9.4 mg/dL (ref 8.7–10.2)
Chloride: 105 mmol/L (ref 96–106)
Creatinine, Ser: 0.83 mg/dL (ref 0.57–1.00)
Glucose: 99 mg/dL (ref 70–99)
Potassium: 4.1 mmol/L (ref 3.5–5.2)
Sodium: 146 mmol/L — ABNORMAL HIGH (ref 134–144)
eGFR: 82 mL/min/{1.73_m2} (ref >59)

## 2024-02-24 ENCOUNTER — Encounter: Payer: Self-pay | Admitting: Student

## 2024-02-25 NOTE — Progress Notes (Signed)
 Internal Medicine Clinic Attending  Case discussed with the resident at the time of the visit.  We reviewed the resident's history and exam and pertinent patient test results.  I agree with the assessment, diagnosis, and plan of care documented in the resident's note.

## 2024-03-03 ENCOUNTER — Other Ambulatory Visit (HOSPITAL_COMMUNITY): Payer: Self-pay

## 2024-03-25 ENCOUNTER — Other Ambulatory Visit (HOSPITAL_COMMUNITY): Payer: Self-pay

## 2024-04-01 ENCOUNTER — Encounter: Payer: Self-pay | Admitting: Gastroenterology

## 2024-04-01 ENCOUNTER — Ambulatory Visit (INDEPENDENT_AMBULATORY_CARE_PROVIDER_SITE_OTHER): Admitting: Gastroenterology

## 2024-04-01 ENCOUNTER — Other Ambulatory Visit (HOSPITAL_COMMUNITY): Payer: Self-pay

## 2024-04-01 VITALS — BP 122/64 | HR 82 | Ht 62.0 in | Wt 223.0 lb

## 2024-04-01 DIAGNOSIS — Z1211 Encounter for screening for malignant neoplasm of colon: Secondary | ICD-10-CM

## 2024-04-01 DIAGNOSIS — K5792 Diverticulitis of intestine, part unspecified, without perforation or abscess without bleeding: Secondary | ICD-10-CM

## 2024-04-01 MED ORDER — NA SULFATE-K SULFATE-MG SULF 17.5-3.13-1.6 GM/177ML PO SOLN
1.0000 | Freq: Once | ORAL | 0 refills | Status: AC
  Filled 2024-04-01 – 2024-06-10 (×2): qty 354, 1d supply, fill #0

## 2024-04-01 NOTE — Patient Instructions (Signed)
 You have been scheduled for a colonoscopy. Please follow written instructions given to you at your visit today.   If you use inhalers (even only as needed), please bring them with you on the day of your procedure.  DO NOT TAKE 7 DAYS PRIOR TO TEST- Trulicity (dulaglutide) Ozempic, Wegovy (semaglutide) Mounjaro (tirzepatide) Bydureon Bcise (exanatide extended release)  DO NOT TAKE 1 DAY PRIOR TO YOUR TEST Rybelsus (semaglutide) Adlyxin (lixisenatide) Victoza (liraglutide) Byetta (exanatide) _______________________________________________________________________  _______________________________________________________  If your blood pressure at your visit was 140/90 or greater, please contact your primary care physician to follow up on this.  _______________________________________________________  If you are age 86 or older, your body mass index should be between 23-30. Your Body mass index is 40.79 kg/m. If this is out of the aforementioned range listed, please consider follow up with your Primary Care Provider.  If you are age 74 or younger, your body mass index should be between 19-25. Your Body mass index is 40.79 kg/m. If this is out of the aformentioned range listed, please consider follow up with your Primary Care Provider.   ________________________________________________________  The [place] [facility] providers would like to encourage you to use MYCHART to communicate with providers for non-urgent requests or questions.  Due to long hold times on the telephone, sending your provider a message by [facility] may be a faster and more efficient way to get a response.  Please allow 48 business hours for a response.  Please remember that this is for non-urgent requests.  _______________________________________________________

## 2024-04-01 NOTE — Progress Notes (Signed)
 04/01/2024 MIRELLE BISKUP [id number] 03-08-1966  HISTORY OF PRESENT ILLNESS:  This is a 58 year old female with remote history of left-sided segmental ischemic colitis in 2013 who was recently seen by our service (Dr. Lavon Paganini) in the hospital for complaints of abdominal pain fever, etc.  CT scan showed a long segment of thickened mid descending to sigmoid colon with associated fat stranding and multiple prominent diverticuli present throughout the involved segment with a small amount of adjacent pericolonic fluid and some free fluid in the cul-de-sac.  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  Was treated with Unasyn and then converted to oral antibiotics.  That was in early March and she is doing completely well since finishing the antibiotics.  He is having no abdominal pain at all.  Having normal bowel movements.  No rectal bleeding.  Eating a normal diet.   Last colonoscopy was December 2013 by Dr. Randa Evens in the hospital that showed ischemic colitis in the left colon.  Past Medical History:  Diagnosis Date   COPD (chronic obstructive pulmonary disease) (HCC) 01/19/2014   GERD (gastroesophageal reflux disease)    Hypertension    Past Surgical History:  Procedure Laterality Date   COLONOSCOPY  11/19/2012   Procedure: COLONOSCOPY;  Surgeon: Vertell Novak., MD;  Location: [facility];  Service: Endoscopy;  Laterality: N/A;   TUBAL LIGATION      reports that she quit smoking about 16 years ago. Her smoking use included cigarettes. She started smoking about 41 years ago. She has a 25 pack-year smoking history. She has never used smokeless tobacco. She reports that she does not drink alcohol and does not use drugs. family history includes Breast cancer in her paternal aunt; Breast cancer (age of onset: 10) in her maternal aunt; COPD in her sister; Cancer in her maternal aunt, maternal grandmother, and paternal aunt; Diabetes in her paternal  grandmother; Heart disease in her brother, father, maternal grandfather, paternal grandfather, and paternal grandmother; Hypertension in her brother, father, maternal grandfather, mother, and paternal grandfather; Stroke in her mother. Allergies  Allergen Reactions   Sulfa Drugs Cross Reactors Nausea And Vomiting    "made me sick- I had to have IVs"      Outpatient Encounter Medications as of 04/01/2024  Medication Sig   acetaminophen (TYLENOL) 500 MG tablet Take 2 tablets (1,000 mg total) by mouth every 6 (six) hours.   amLODipine (NORVASC) 10 MG tablet Take 1 tablet (10 mg total) by mouth daily.   loratadine (CLARITIN) 10 MG tablet Take 10 mg by mouth daily.   omeprazole (PRILOSEC OTC) 20 MG tablet Take 20 mg by mouth daily.   rosuvastatin (CRESTOR) 20 MG tablet Take 1 tablet (20 mg total) by mouth daily.   polyethylene glycol (MIRALAX) 17 g packet Mix 1 capful (17 grams) in 8 ounces of fluid and take by mouth daily. (Patient not taking: Reported on 04/01/2024)   No facility-administered encounter medications on file as of 04/01/2024.    REVIEW OF SYSTEMS  : All other systems reviewed and negative except where noted in the History of Present Illness.   PHYSICAL EXAM: BP 122/64   Pulse 82   Ht 5\' 2"  (1.575 m)   Wt 223 lb (101.2 kg)   BMI 40.79 kg/m  General: Well developed white female in no acute distress Head: Normocephalic and atraumatic Eyes:  Sclerae anicteric, conjunctiva pink. Ears: Normal auditory acuity Lungs: Clear throughout to auscultation; no W/R/R. Heart:  Regular rate and rhythm; no M/R/G. Abdomen: Soft, non-distended.  BS present.  Non-tender. Rectal:  Will be done at the time of colonoscopy. Musculoskeletal: Symmetrical with no gross deformities  Skin: No lesions on visible extremities Neurological: Alert oriented x 4, grossly non-focal Psychological:  Alert and cooperative. Normal mood and affect  ASSESSMENT AND PLAN: *58 year old female with remote history  of left-sided segmental ischemic colitis in 2013 who was recently seen by our service in the hospital for complaints of abdominal pain fever, etc.  CT scan showed a long segment of thickened mid descending to sigmoid colon with associated fat stranding and multiple prominent diverticuli present throughout the involved segment with a small amount of adjacent pericolonic fluid and some free fluid in the cul-de-sac.  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  Was treated with Unasyn and then converted to oral antibiotics.  That was in early March and she is doing completely well since finishing the antibiotics.  No abdominal pain at all.  Needs colonoscopy as last was in 2013.  Will schedule with Dr. Nandigam at the end of June at her request and she takes her grandkids to school every day.  She was advised that if she develops any recurrent pain in the interim that she needs to let us  know.  The risks, benefits, and alternatives to colonoscopy were discussed with the patient and she consents to proceed.  CC:  Dorthy Gavia, MD

## 2024-04-06 ENCOUNTER — Other Ambulatory Visit: Payer: Self-pay

## 2024-04-13 ENCOUNTER — Other Ambulatory Visit (HOSPITAL_COMMUNITY): Payer: Self-pay

## 2024-04-20 ENCOUNTER — Ambulatory Visit: Admitting: Student

## 2024-04-20 ENCOUNTER — Other Ambulatory Visit (HOSPITAL_COMMUNITY): Payer: Self-pay

## 2024-04-20 ENCOUNTER — Ambulatory Visit: Payer: Self-pay

## 2024-04-20 VITALS — BP 146/76 | HR 81 | Temp 97.5°F | Ht 62.0 in | Wt 230.0 lb

## 2024-04-20 DIAGNOSIS — Z8719 Personal history of other diseases of the digestive system: Secondary | ICD-10-CM | POA: Diagnosis present

## 2024-04-20 DIAGNOSIS — I10 Essential (primary) hypertension: Secondary | ICD-10-CM | POA: Diagnosis not present

## 2024-04-20 DIAGNOSIS — K5792 Diverticulitis of intestine, part unspecified, without perforation or abscess without bleeding: Secondary | ICD-10-CM

## 2024-04-20 MED ORDER — AMLODIPINE BESYLATE 10 MG PO TABS
10.0000 mg | ORAL_TABLET | Freq: Every day | ORAL | 11 refills | Status: AC
  Filled 2024-04-20: qty 30, 30d supply, fill #0
  Filled 2024-05-19: qty 30, 30d supply, fill #1
  Filled 2024-06-23: qty 30, 30d supply, fill #2
  Filled 2024-07-21: qty 30, 30d supply, fill #3
  Filled 2024-08-11 – 2024-08-12 (×2): qty 30, 30d supply, fill #4
  Filled 2024-09-12: qty 30, 30d supply, fill #5
  Filled 2024-10-19: qty 30, 30d supply, fill #6
  Filled 2024-11-16: qty 30, 30d supply, fill #7
  Filled 2024-12-12: qty 30, 30d supply, fill #8
  Filled 2025-01-15: qty 30, 30d supply, fill #9

## 2024-04-20 NOTE — Assessment & Plan Note (Signed)
 Presenting with acute onset left lower quadrant pain, sxs have resolved.  Tolerating regular diet, denies any and vomiting.  She has no systemic signs, abdomen is tender to deep palpation in the left lower quadrant.  Given patient prior history of complicated diverticulitis, my highest suspicion is for uncomplicated diverticulitis.  Other causes of abdominal pain including appendicitis was also considered but less likely based on history and exam.  Will hold off on starting antibiotics, in the absence of systemic signs.  Will order CBC, to look for look for any signs of infection.  Advised patient to also call GI office about this new pain. Patient advised if she has new fevers, worsening abdominal pain to go to the nearest urgent care or emergency room.  I also encouraged patient to continue to take MiraLAX  to prevent constipation which can worsen the diverticulitis. Okay to hold MiraLAX  if she is having recurrent loose stools.  - CBC ordered. - Will not order antibiotics at this time.

## 2024-04-20 NOTE — Telephone Encounter (Signed)
 Pt appt is today 5/5 with Dr Teodora Fell.

## 2024-04-20 NOTE — Telephone Encounter (Signed)
 Copied from CRM [id number]. Topic: Clinical - Red Word Triage >> Apr 20, 2024 10:41 AM [name] wrote: Red Word that prompted transfer to Nurse Triage: Possible diverticulitis flare up.  Chief Complaint: abd pain, hx diverticulitis Symptoms: abd pain Frequency: comes and goes Pertinent Negatives: Patient denies fever, diarrhea Disposition: [] ED /[] Urgent Care (no appt availability in office) / [x] Appointment(In office/virtual)/ []  [facility]/ [] Home Care/ [] Refused Recommended Disposition /[] [facility]/ []  Follow-up with PCP Additional Notes: per protocol apt made for today; care advice given, denies questions; instructed to go to ER if becomes worse.   Reason for Disposition  [1] MODERATE pain (e.g., interferes with normal activities) AND [2] pain comes and goes (cramps) AND [3] present > 24 hours  (Exception: Pain with Vomiting or Diarrhea - see that Guideline.)  Answer Assessment - Initial Assessment Questions 1. LOCATION: "Where does it hurt?"      Abd pain,  2. RADIATION: "Does the pain shoot anywhere else?" (e.g., chest, back)     no 3. ONSET: "When did the pain begin?" (e.g., minutes, hours or days ago)      Over the weekend 4. SUDDEN: "Gradual or sudden onset?"     Hx divertic 5. PATTERN "Does the pain come and go, or is it constant?"    - If it comes and goes: "How long does it last?" "Do you have pain now?"     (Note: Comes and goes means the pain is intermittent. It goes away completely between bouts.)    - If constant: "Is it getting better, staying the same, or getting worse?"      (Note: Constant means the pain never goes away completely; most serious pain is constant and gets worse.)      Comes and goes 6. SEVERITY: "How bad is the pain?"  (e.g., Scale 1-10; mild, moderate, or severe)    - MILD (1-3): Doesn't interfere with normal activities, abdomen soft and not tender to touch.     - MODERATE (4-7): Interferes with normal activities or awakens  from sleep, abdomen tender to touch.     - SEVERE (8-10): Excruciating pain, doubled over, unable to do any normal activities.       No pain today 7. RECURRENT SYMPTOM: "Have you ever had this type of stomach pain before?" If Yes, ask: "When was the last time?" and "What happened that time?"      Hx divertic 8. CAUSE: "What do you think is causing the stomach pain?"     divertic 9. RELIEVING/AGGRAVATING FACTORS: "What makes it better or worse?" (e.g., antacids, bending or twisting motion, bowel movement)     denies 10. OTHER SYMPTOMS: "Do you have any other symptoms?" (e.g., back pain, diarrhea, fever, urination pain, vomiting)       nausea 11. PREGNANCY: "Is there any chance you are pregnant?" "When was your last menstrual period?"       na  Protocols used: Abdominal Pain - [facility]

## 2024-04-20 NOTE — Progress Notes (Signed)
 CC: Acute visit for abdominal pain.  HPI:  [name] is a 58 y.o. 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  Notably, she was recently hospitalized for acute diverticulitis  in March 2025, received treatment with antibiotics, and she last saw GI in 03/2024, where she was doing well, completed p.o. antibiotics after discharge from the hospital, and eating a normal diet. She has colonoscopy scheduled for 06/30.  Patient was advised to inform both GI and primary care doctor if she had any recurrent abdominal pain.  3 days ago, she had a one-time episode of sharp left-sided abdominal pain, lasted for an hour, and was associated with mild nausea.  No vomiting.  Abdominal pain and nausea has since resolved. She denies constipation, she has been having regular bowel movement.  Denies rectal bleeding.  No fevers, chills or chest pain.   Past Medical History:  Diagnosis Date   COPD (chronic obstructive pulmonary disease) (HCC) 01/19/2014   GERD (gastroesophageal reflux disease)    Hypertension     Current Outpatient Medications on File Prior to Visit  Medication Sig Dispense Refill   acetaminophen  (TYLENOL ) 500 MG tablet Take 2 tablets (1,000 mg total) by mouth every 6 (six) hours. 30 tablet 0   loratadine  (CLARITIN ) 10 MG tablet Take 10 mg by mouth daily.     omeprazole (PRILOSEC OTC) 20 MG tablet Take 20 mg by mouth daily.     polyethylene glycol (MIRALAX ) 17 g packet Mix 1 capful (17 grams) in 8 ounces of fluid and take by mouth daily. (Patient not taking: Reported on 04/01/2024) 14 each 0   rosuvastatin  (CRESTOR ) 20 MG tablet Take 1 tablet (20 mg total) by mouth daily. 30 tablet 11   No current facility-administered medications on file prior to visit.    Review of Systems: ROS negative except for what is noted on the assessment and plan.  Vitals:   04/20/24 1446  BP:  (!) 146/76  Pulse: 81  Temp: (!) 97.5 F (36.4 C)  TempSrc: Oral  SpO2: 98%  Weight: 230 lb (104.3 kg)  Height: 5\' 2"  (1.575 m)   Physical Exam: Constitutional: Sitting in chair, NAD Cardiovascular: regular rate and rhythm, no m/r/g Pulmonary/Chest: normal work of breathing on room air, lungs clear to auscultation bilaterally Abdominal: soft, tender to deep palpation in LLQ.  No involuntary guarding.  Assessment & Plan:   # Abdominal pain # Prior Hx of Diverticulitis Presenting with acute onset left lower quadrant pain, which has since resolved.  Tolerating regular diet, denies any and vomiting.  She has no systemic signs, abdomen is tender to deep palpation in the left lower quadrant.  Given patient prior history of complicated diverticulitis, my highest suspicion is for diverticulitis.  Other causes of abdominal pain including appendicitis was also considered but less likely based on history and exam.  Will hold off on starting antibiotics, in the absence of systemic signs.  Will order CBC, to look for look for any signs of infection.  Advised patient to also call GI office about this new pain. Patient advised if she has new fevers, worsening abdominal pain to go to the nearest urgent care or emergency room.  I also encouraged patient to continue to take MiraLAX  to prevent constipation which can worsen the diverticulitis. Okay to hold MiraLAX  if she is having recurrent loose stools.  - CBC ordered. - Will not  order antibiotics at this time.  Hypertension Stable, on amlodipine  10 mg. -Have refilled for 6 months.  Patient seen with Dr.  Bevelyn Bryant, MD [facility] Internal Medicine, PGY-1 Pager: [phone] Date 04/20/2024 Time 4:20 PM

## 2024-04-20 NOTE — Patient Instructions (Addendum)
 Thank you, [name] for allowing us  to provide your care today.   Today we discussed your abdominal pain.  I am glad it has resolved. Please continue to monitor  your sxs. 1.  Please call your GI office to inform them of your symptoms. 2.  I will call you with the results of the labs.   I have ordered the following labs for you:   Lab Orders         CBC with Diff      Tests ordered today:    Referrals ordered today:   Referral Orders  No referral(s) requested today     I have ordered the following medication/changed the following medications:   Stop the following medications: There are no discontinued medications.   Start the following medications: No orders of the defined types were placed in this encounter.    Follow up: 3 months    Remember: If you have new fevers, worsening abdominal pain, persistent nausea, vomiting, or worsening constipation, call internal medicine clinic phone number at [phone], or go to the nearest urgent care or emergency room.  Marni Sins, MD  [facility]

## 2024-04-21 LAB — CBC WITH DIFFERENTIAL/PLATELET
Basophils Absolute: 0 10*3/uL (ref 0.0–0.2)
Basos: 1 %
EOS (ABSOLUTE): 0.2 10*3/uL (ref 0.0–0.4)
Eos: 4 %
Hematocrit: 41 % (ref 34.0–46.6)
Hemoglobin: 13.5 g/dL (ref 11.1–15.9)
Immature Grans (Abs): 0 10*3/uL (ref 0.0–0.1)
Immature Granulocytes: 0 %
Lymphocytes Absolute: 2 10*3/uL (ref 0.7–3.1)
Lymphs: 35 %
MCH: 29.6 pg (ref 26.6–33.0)
MCHC: 32.9 g/dL (ref 31.5–35.7)
MCV: 90 fL (ref 79–97)
Monocytes Absolute: 0.5 10*3/uL (ref 0.1–0.9)
Monocytes: 9 %
Neutrophils Absolute: 2.9 10*3/uL (ref 1.4–7.0)
Neutrophils: 51 %
Platelets: 245 10*3/uL (ref 150–450)
RBC: 4.56 x10E6/uL (ref 3.77–5.28)
RDW: 13.1 % (ref 11.7–15.4)
WBC: 5.6 10*3/uL (ref 3.4–10.8)

## 2024-04-21 NOTE — Progress Notes (Signed)
 Internal Medicine Clinic Attending  Case discussed with the resident at the time of the visit.  We reviewed the resident's history and exam and pertinent patient test results.  I agree with the assessment, diagnosis, and plan of care documented in the resident's note.

## 2024-04-22 NOTE — Progress Notes (Signed)
 Internal Medicine Clinic Attending  Case discussed with the resident at the time of the visit.  We reviewed the resident's history and exam and pertinent patient test results.  I agree with the assessment, diagnosis, and plan of care documented in the resident's note.

## 2024-04-22 NOTE — Progress Notes (Signed)
 CBC normal

## 2024-04-24 ENCOUNTER — Other Ambulatory Visit: Payer: Self-pay | Admitting: Student

## 2024-04-24 ENCOUNTER — Other Ambulatory Visit: Payer: Self-pay

## 2024-04-27 ENCOUNTER — Other Ambulatory Visit (HOSPITAL_COMMUNITY): Payer: Self-pay

## 2024-04-27 MED ORDER — ACETAMINOPHEN 500 MG PO TABS
1000.0000 mg | ORAL_TABLET | Freq: Four times a day (QID) | ORAL | 0 refills | Status: AC
  Filled 2024-04-27 – 2024-06-12 (×2): qty 30, 4d supply, fill #0

## 2024-04-27 MED ORDER — POLYETHYLENE GLYCOL 3350 17 G PO PACK
17.0000 g | PACK | Freq: Every day | ORAL | 0 refills | Status: DC
  Filled 2024-04-27: qty 14, 14d supply, fill #0

## 2024-05-01 ENCOUNTER — Other Ambulatory Visit (HOSPITAL_COMMUNITY): Payer: Self-pay

## 2024-05-07 ENCOUNTER — Other Ambulatory Visit (HOSPITAL_COMMUNITY): Payer: Self-pay

## 2024-05-07 ENCOUNTER — Other Ambulatory Visit: Payer: Self-pay | Admitting: Student

## 2024-05-07 DIAGNOSIS — E7800 Pure hypercholesterolemia, unspecified: Secondary | ICD-10-CM

## 2024-05-07 MED ORDER — ROSUVASTATIN CALCIUM 20 MG PO TABS
20.0000 mg | ORAL_TABLET | Freq: Every day | ORAL | 11 refills | Status: AC
  Filled 2024-05-07: qty 30, 30d supply, fill #0
  Filled 2024-06-12: qty 30, 30d supply, fill #1
  Filled 2024-07-12: qty 30, 30d supply, fill #2
  Filled 2024-07-21 – 2024-08-11 (×2): qty 30, 30d supply, fill #3
  Filled 2024-09-12: qty 30, 30d supply, fill #4
  Filled 2024-10-12: qty 30, 30d supply, fill #5
  Filled 2024-11-16: qty 30, 30d supply, fill #6
  Filled 2024-12-12: qty 30, 30d supply, fill #7
  Filled 2025-01-15: qty 30, 30d supply, fill #8

## 2024-05-07 NOTE — Telephone Encounter (Signed)
 Medication sent to pharmacy

## 2024-05-22 ENCOUNTER — Encounter: Payer: Self-pay | Admitting: *Deleted

## 2024-06-10 ENCOUNTER — Other Ambulatory Visit (HOSPITAL_COMMUNITY): Payer: Self-pay

## 2024-06-12 ENCOUNTER — Other Ambulatory Visit: Payer: Self-pay

## 2024-06-12 ENCOUNTER — Other Ambulatory Visit (HOSPITAL_COMMUNITY): Payer: Self-pay

## 2024-06-14 ENCOUNTER — Telehealth: Payer: Self-pay | Admitting: Internal Medicine

## 2024-06-14 NOTE — Telephone Encounter (Signed)
 Patient vomited all of first part of Suprep Offered alternative prep but she said this also happened with prior colonoscopy in hospital and she prefers to cancel and reschedule with different prep.  Sounds like prescribing metaclopramide may be useful also.

## 2024-06-15 ENCOUNTER — Telehealth: Payer: Self-pay

## 2024-06-15 ENCOUNTER — Telehealth: Payer: Self-pay | Admitting: *Deleted

## 2024-06-15 ENCOUNTER — Encounter: Admitting: Gastroenterology

## 2024-06-15 NOTE — Telephone Encounter (Signed)
 Attempted to reach this patient again to reschedule her Colonoscopy and discuss alternative preps. Left VM. Will notify Dr. Nandigam.

## 2024-06-15 NOTE — Telephone Encounter (Signed)
 Call to pt as she had called on call dr to report medication issue with the prep, lm for pt to call to r/s with new prep and anti nausea medication and to call Dr Trenna office if any questions

## 2024-06-29 ENCOUNTER — Telehealth: Payer: Self-pay

## 2024-06-29 NOTE — Telephone Encounter (Signed)
 Patient was contacted via telephone to schedule future appointment.  Appointment has been scheduled for 06/30/24 at 3 pm with Dr. Diana Nguyen.  Copied from CRM [id number]. Topic: Appointments - Scheduling Inquiry for Clinic >> Jun 26, 2024  9:32 AM Laurier C wrote: Reason for CRM: Patient is needing a call back to get scheduled for an injection hip (roch bursitis). Patient can be reached at [phone]

## 2024-06-30 ENCOUNTER — Other Ambulatory Visit (HOSPITAL_COMMUNITY): Payer: Self-pay

## 2024-06-30 ENCOUNTER — Ambulatory Visit

## 2024-06-30 VITALS — BP 133/74 | HR 95 | Temp 98.5°F | Ht 62.0 in | Wt 234.0 lb

## 2024-06-30 DIAGNOSIS — M7062 Trochanteric bursitis, left hip: Secondary | ICD-10-CM

## 2024-06-30 MED ORDER — CELECOXIB 100 MG PO CAPS
100.0000 mg | ORAL_CAPSULE | Freq: Every day | ORAL | 0 refills | Status: AC
  Filled 2024-06-30: qty 14, 14d supply, fill #0

## 2024-06-30 NOTE — Assessment & Plan Note (Signed)
 Patient began having left hip pain a few months ago. Weight loss was improving and she had lost around 50 lbs, but her hip began to flare again and she has gained the weight back. On physical exam, she is tender to palpation over left greater trochanter, with some pain extending to her piriformis, and slightly reduced left hip abduction strength, consistent with left hip greater trochanteric bursitis. We will refer her for physical therapy, but I did provide her with a handout of hip bursitis stretches, and advised her to only do light stretching in the meantime, and discontinue if she feels pain. We discussed holding off on the exercises from the handout as well. She tries to avoid NSAIDs due to GI issues, but states that she can take them for short periods of time. We will send in a short course prescription for Celebrex , and advised her to take this only as needed until her appointment for the injection, and to discontinue if she has any GI upset. We discussed a referral to Sports Medicine for the injection, but she would prefer to remain with [facility].  - return in ~2 weeks when Dr. Rosan is precepting for left hip bursa injection - PT referral - Celebrex  100 mg #14 prn  - EO Hip bursitis stretches handout provided

## 2024-06-30 NOTE — Patient Instructions (Signed)
  Thank you, [name] for allowing us  to provide your care today. Today we discussed the following:  - We discussed light stretching of the hip and legs; hold off on the strengthening exercises from the packet    Referrals ordered today:   Referral Orders         Ambulatory referral to Physical Therapy      I have ordered the following medication/changed the following medications:   Stop the following medications: Medications Discontinued During This Encounter  Medication Reason   polyethylene glycol (MIRALAX ) 17 g packet Completed Course     Start the following medications: Meds ordered this encounter  Medications   celecoxib  (CELEBREX ) 100 MG capsule    Sig: Take 1 capsule (100 mg total) by mouth daily for 14 days.    Dispense:  14 capsule    Refill:  0     Follow up: 2 weeks, when Dr. Rosan is in the office for a left hip injection    Remember: Following the hip injection, wait about 1 week before beginning PT  Should you have any questions or concerns please call the Internal Medicine Clinic at [phone].     Doyal Miyamoto, MD [facility]

## 2024-06-30 NOTE — Progress Notes (Signed)
 Acute Office Visit  Subjective:     Patient ID: [name], female    DOB: 1966-07-23, 58 y.o.   MRN: [id number]  Chief Complaint  Patient presents with   Hip Pain   Ms. Cornick is a 58 yr old female with a history of diverticulitis and HTN who presents to clinic for an acute visit regarding left hip pain.   She states she first began having pain a few months back. She has a history of left hip trochanteric bursitis which she previously received a hip injection for in the [facility] back in 2020. She was unable to complete PT at that time. She recently was able to lose around 50 lbs on her own, but she began having hip pain, and since then has been struggling to move well and get back into the pool, stating that she has gained back the weight she lost.   She is scheduled for a colonoscopy on August 15th following acute episode of diverticulitis flare. Currently she says that her symptoms are manageable and she is not having pain. She has been watching what she's eating.    ROS: Denies headaches, dizziness, fever, chills, runny nose, sore throat, vision changes, hearing changes, chest pain, shortness of breath, difficulty breathing, nausea, vomiting, abdominal pain. Denies increased urinary frequency, pain with urination, constipation or diarrhea. No recent falls.       Objective:    BP 133/74 (BP Location: Right Arm, Patient Position: Sitting, Cuff Size: Small)   Pulse 95   Temp 98.5 F (36.9 C) (Oral)   Ht 5' 2 (1.575 m)   Wt 234 lb (106.1 kg)   SpO2 97%   BMI 42.80 kg/m  BP Readings from Last 3 Encounters:  06/30/24 133/74  04/20/24 (!) 146/76  04/01/24 122/64   Wt Readings from Last 3 Encounters:  06/30/24 234 lb (106.1 kg)  04/20/24 230 lb (104.3 kg)  04/01/24 223 lb (101.2 kg)      Physical Exam:   Constitutional: well-appearing female sitting in exam chair, in no acute distress. Ambulates without use of assistance device  HEENT: normocephalic atraumatic, mucous  membranes moist Eyes: conjunctiva non-erythematous Cardiovascular: regular rate and rhythm, bilateral radial pulses 2+, bilateral dorsal pedal pulses 2+, brisk capillary refill bilateral feet and hands  Pulmonary/Chest: normal work of breathing on room air Abdominal: soft, non-tender, non-distended MSK: normal bulk and tone, tender to palpation over left greater trochanter, with mild pain extending to piriformis, 4/5 strength to left hip abduction, full AROM to left hip abduction  Neurological: alert & oriented x 3 Skin: warm and dry Psych: mood calm, behavior normal, thought content normal, judgement normal    No results found for any visits on 06/30/24.      Assessment & Plan:   Problem List Items Addressed This Visit       Musculoskeletal and Integument   Greater trochanteric bursitis of left hip - Primary   Patient began having left hip pain a few months ago. Weight loss was improving and she had lost around 50 lbs, but her hip began to flare again and she has gained the weight back. On physical exam, she is tender to palpation over left greater trochanter, with some pain extending to her piriformis, and slightly reduced left hip abduction strength, consistent with left hip greater trochanteric bursitis. We will refer her for physical therapy, but I did provide her with a handout of hip bursitis stretches, and advised her to only do light stretching in  the meantime, and discontinue if she feels pain. We discussed holding off on the exercises from the handout as well. She tries to avoid NSAIDs due to GI issues, but states that she can take them for short periods of time. We will send in a short course prescription for Celebrex , and advised her to take this only as needed until her appointment for the injection, and to discontinue if she has any GI upset. We discussed a referral to Sports Medicine for the injection, but she would prefer to remain with [facility].  - return in ~2 weeks when Dr.  Rosan is precepting for left hip bursa injection - PT referral - Celebrex  100 mg #14 prn  - EO Hip bursitis stretches handout provided       Relevant Orders   Ambulatory referral to Physical Therapy    Meds ordered this encounter  Medications   celecoxib  (CELEBREX ) 100 MG capsule    Sig: Take 1 capsule (100 mg total) by mouth daily for 14 days.    Dispense:  14 capsule    Refill:  0    Return in about 2 weeks (around 07/14/2024) for left hip bursa injection .  Patient discussed with Dr. Lovie, who also saw and evaluated the patient.  Doyal Miyamoto, MD [facility]  PGY-1  06/30/24, 3:58 PM

## 2024-07-01 ENCOUNTER — Other Ambulatory Visit (HOSPITAL_COMMUNITY): Payer: Self-pay

## 2024-07-01 NOTE — Progress Notes (Signed)
Internal Medicine Clinic Attending  I was physically present during the key portions of the resident provided service and participated in the medical decision making of patient's management care. I reviewed pertinent patient test results.  The assessment, diagnosis, and plan were formulated together and I agree with the documentation in the resident's note.  Mercie Eon, MD

## 2024-07-10 ENCOUNTER — Telehealth: Payer: Self-pay

## 2024-07-10 NOTE — Telephone Encounter (Signed)
 Attempted to contact patient via telephone to schedule follow up: 2 weeks, when Dr. Rosan is in the office for a left hip injection, but no answer.  Left detailed message stating I would schedule her an appointment for next week and send her message via my chart to make her aware of date and time.  If patient calls back please make her aware that appointment has been scheduled for next Wednesday 07/15/2024 at 1:15 pm. If date and time does not work please respond to my chart message.

## 2024-07-14 ENCOUNTER — Encounter: Payer: Self-pay | Admitting: Physical Therapy

## 2024-07-14 ENCOUNTER — Ambulatory Visit: Attending: Internal Medicine | Admitting: Physical Therapy

## 2024-07-14 ENCOUNTER — Other Ambulatory Visit: Payer: Self-pay

## 2024-07-14 DIAGNOSIS — M6281 Muscle weakness (generalized): Secondary | ICD-10-CM | POA: Insufficient documentation

## 2024-07-14 DIAGNOSIS — M7062 Trochanteric bursitis, left hip: Secondary | ICD-10-CM | POA: Insufficient documentation

## 2024-07-14 DIAGNOSIS — M7989 Other specified soft tissue disorders: Secondary | ICD-10-CM | POA: Insufficient documentation

## 2024-07-14 NOTE — Therapy (Signed)
 OUTPATIENT PHYSICAL THERAPY LOWER EXTREMITY EVALUATION   Patient Name: [name] MRN: [id number] DOB:[birth year], 58 y.o., female Today's Date: 07/14/2024  END OF SESSION:  PT End of Session - 07/14/24 1125     Visit Number 1    Number of Visits 1    PT Start Time 1045    PT Stop Time 1123    PT Time Calculation (min) 38 min    Activity Tolerance Patient tolerated treatment well    Behavior During Therapy WFL for tasks assessed/performed          Past Medical History:  Diagnosis Date   COPD (chronic obstructive pulmonary disease) (HCC) 01/19/2014   GERD (gastroesophageal reflux disease)    Hypertension    Past Surgical History:  Procedure Laterality Date   COLONOSCOPY  11/19/2012   Procedure: COLONOSCOPY;  Surgeon: Lynwood LITTIE Celestia Mickey., MD;  Location: [facility] ENDOSCOPY;  Service: Endoscopy;  Laterality: N/A;   TUBAL LIGATION     Patient Active Problem List   Diagnosis Date Noted   Diverticulitis 02/16/2024   Colitis 02/15/2024   Constipation 02/15/2024   Hypokalemia 02/15/2024   LLQ abdominal pain 02/05/2024   Chest pain 12/20/2022   Right shoulder pain 10/23/2022   Rash 07/03/2022   At risk for obstructive sleep apnea 01/18/2022   Prediabetes 03/28/2021   Proteinuria 04/27/2020   Greater trochanteric bursitis of left hip 05/23/2018   Anemia 12/04/2017   Left hip pain 03/01/2015   Hyperlipidemia 05/27/2013   Obesity, Class III, BMI 40-49.9 (morbid obesity) 05/27/2013   Ischemic colitis (HCC) 11/20/2012   Essential hypertension 12/21/2011   Dyspnea on exertion 12/21/2011   Colon cancer screening 12/21/2011   GERD (gastroesophageal reflux disease) 12/21/2011    PCP: Amilibia, Jaden, DO  REFERRING PROVIDER: Lovie Clarity, MD  REFERRING DIAG:  Diagnosis  M70.62 (ICD-10-CM) - Greater trochanteric bursitis of left hip    Rationale for Evaluation and Treatment: Rehabilitation  THERAPY DIAG:  Other specified soft tissue disorders  Muscle weakness  (generalized)  PERTINENT HISTORY: HTN, GERD  WEIGHT BEARING RESTRICTIONS: No  FALLS:  Has patient fallen in last 6 months? No  LIVING ENVIRONMENT: Lives with: lives with their spouse Lives in: House/apartment Stairs: No Has following equipment at home: None  OCCUPATION: not currently working   PRECAUTIONS: None ---------------------------------------------------------------------------------------------  SUBJECTIVE:   SUBJECTIVE STATEMENT: Eval statement 07/14/2024: has been having L hip pain for 10 years or so, tends to be lowkey pain but has slowly gotten worse over the years and occasionally has flare ups.  6/10 pain in the last week, up to a 9/10. No n/t, feels like a dull ache. Sometimes feels like my whole pelvic bone hurts.  Notices the pain most when staying in one spot or walking. Has a steroid shot scheduled for tomorrow.  RED FLAGS: None   PLOF: Independent  PATIENT GOALS: pain relief   NEXT MD VISIT: tomorrow for steroid injection ---------------------------------------------------------------------------------------------  OBJECTIVE:  Note: Objective measures were completed at Evaluation unless otherwise noted.  DIAGNOSTIC FINDINGS: no relevant imaging  PATIENT SURVEYS:  LEFS:30/80  COGNITION: Overall cognitive status: Within functional limits for tasks assessed     SENSATION: WFL  EDEMA:  none  MUSCLE LENGTH: Tight L TFL  POSTURE: increased lumbar lordosis  PALPATION: Tednerness to L TFL (reproduced symptoms)  LOWER EXTREMITY ROM:  Active ROM Right eval Left eval  Hip flexion    Hip extension    Hip abduction    Hip adduction    Hip internal  rotation    Hip external rotation    Knee flexion    Knee extension    Ankle dorsiflexion    Ankle plantarflexion    Ankle inversion    Ankle eversion     (Blank rows = not tested)  ! Indicates pain with testing  LOWER EXTREMITY MMT:  MMT Right eval Left eval  Hip flexion     Hip extension    Hip abduction 4 4  Hip adduction    Hip internal rotation    Hip external rotation    Knee flexion    Knee extension    Ankle dorsiflexion    Ankle plantarflexion    Ankle inversion    Ankle eversion     (Blank rows = not tested)  ! Indicates pain with testing  GAIT: Distance walked: 165ft  Assistive device utilized: None Level of assistance: Complete Independence Comments: reduced stride length                                                                                                                                [facility] Adult PT Treatment:                                                DATE: 07/14/2024 Manual Therapy: TFL release Self Care: Pt education, detailed below POC discussion    PATIENT EDUCATION:  Education details: Pt received education regarding HEP performance, ADL performance, functional activity tolerance, impairment education, appropriate performance of therapeutic activities. Person educated: Patient Education method: Explanation, Demonstration, Tactile cues, Verbal cues, and Handouts Education comprehension: verbalized understanding and returned demonstration  HOME EXERCISE PROGRAM: Access Code: [email or web address] URL: [email or web address] Date: 07/14/2024 Prepared by: Mabel Kiang  Exercises - ITB Stretch at Wall  - 1 x daily - 7 x weekly - 2 sets - 2 reps - 42m hold - Sidelying ITB Stretch off Table  - 1 x daily - 7 x weekly - 2 sets - 2 reps - 53m hold - Marching with Resistance  - 1 x daily - 4 x weekly - 2 sets - 12 reps - 3s hold - Quadruped Hip Abduction with Resistance Loop  - 1 x daily - 7 x weekly - 2-3 sets - 12 reps - 3s hold ---------------------------------------------------------------------------------------------  ASSESSMENT:  CLINICAL IMPRESSION: Eval impression (07/14/2024): Pt. attended today's physical therapy session for evaluation of L hip pain. Pt has complaints of pain with walking, and staying  in one position. Pt has notable deficits with L hip mobility, strength, and TFL motility.  Signs and symptoms are concurrent with TFL soft tissue dysfunction. Pt would benefit from therapeutic focus on TFL motility, Hip flexor strengthening, and gluteal recruitment..  Treatment performed today focused on education and TFL release. Pt demonstrated great understanding of education provided. required minimal cues and no  assistance for appropriate performance with today's activities. While I believe pt would benefit from skilled OPPT to address aforementioned deficits, however pt requested just a one time visit with long term HEP.   OBJECTIVE IMPAIRMENTS: Abnormal gait, decreased knowledge of condition, decreased mobility, decreased strength, increased fascial restrictions, and pain.   ACTIVITY LIMITATIONS: carrying, standing, squatting, and locomotion level  PARTICIPATION LIMITATIONS: driving and shopping  PERSONAL FACTORS: Fitness, Past/current experiences, and Time since onset of injury/illness/exacerbation are also affecting patient's functional outcome.   REHAB POTENTIAL: Good  CLINICAL DECISION MAKING: Stable/uncomplicated  EVALUATION COMPLEXITY: Low   GOALS: Goals reviewed with patient? YES  SHORT TERM GOALS: Target date: 07/14/2024  Pt will be independent with administered HEP to demonstrate the competency necessary for long term managemnet of symptoms at home.  Baseline: Goal status: MET 07/14/2024   --------------------------------------------------------------------------------------------- PLAN:  PT FREQUENCY: 1-2x/week  PT DURATION: 6 weeks  PLANNED INTERVENTIONS: 97110-Therapeutic exercises, 97530- Therapeutic activity, 97112- Neuromuscular re-education, 97535- Self Care, [id number]- Manual therapy, [phone]- Gait training, Patient/Family education, Balance training, Stair training, Taping, and Joint mobilization  PLAN FOR NEXT SESSION: Review HEP, Begin POC as detailed in the  assessment   Mabel Kiang, PT, DPT 07/14/2024, 11:26 AM

## 2024-07-15 ENCOUNTER — Other Ambulatory Visit: Payer: Self-pay

## 2024-07-15 ENCOUNTER — Encounter: Payer: Self-pay | Admitting: Student

## 2024-07-15 ENCOUNTER — Ambulatory Visit: Admitting: Student

## 2024-07-15 VITALS — BP 130/65 | HR 82 | Temp 97.7°F | Ht 62.0 in | Wt 236.8 lb

## 2024-07-15 DIAGNOSIS — M7062 Trochanteric bursitis, left hip: Secondary | ICD-10-CM

## 2024-07-15 MED ORDER — LIDOCAINE HCL (PF) 1 % IJ SOLN
2.0000 mL | Freq: Once | INTRAMUSCULAR | Status: AC
  Administered 2024-07-15: 2 mL via INTRADERMAL

## 2024-07-15 MED ORDER — TRIAMCINOLONE ACETONIDE 40 MG/ML IJ SUSP
40.0000 mg | Freq: Once | INTRAMUSCULAR | Status: AC
  Administered 2024-07-15: 40 mg via INTRA_ARTICULAR

## 2024-07-15 NOTE — Progress Notes (Unsigned)
   CC: Hip pain follow up  HPI:  [name] is a 58 y.o. female living with a history stated below and presents today for follow-up on her left hip pain..   Patient was last seen in the resident [facility] on 07/15 for left hip pain, suspected to be secondary to greater trochanteric bursitis. She reports a history of prior hip injections through the orthopedic clinic. Attended physical therapy yesterday and notes improvement in pain this morning.  Please see problem based assessment and plan for additional details.  Past Medical History:  Diagnosis Date   COPD (chronic obstructive pulmonary disease) (HCC) 01/19/2014   GERD (gastroesophageal reflux disease)    Hypertension     Current Outpatient Medications on File Prior to Visit  Medication Sig Dispense Refill   acetaminophen  (TYLENOL ) 500 MG tablet Take 2 tablets (1,000 mg total) by mouth every 6 (six) hours. 30 tablet 0   amLODipine  (NORVASC ) 10 MG tablet Take 1 tablet (10 mg total) by mouth daily. 30 tablet 11   celecoxib  (CELEBREX ) 100 MG capsule Take 1 capsule (100 mg total) by mouth daily for 14 days. 14 capsule 0   loratadine  (CLARITIN ) 10 MG tablet Take 10 mg by mouth daily.     omeprazole (PRILOSEC OTC) 20 MG tablet Take 20 mg by mouth daily.     rosuvastatin  (CRESTOR ) 20 MG tablet Take 1 tablet (20 mg total) by mouth daily. 30 tablet 11   No current facility-administered medications on file prior to visit.    Review of Systems: ROS negative except for what is noted on the assessment and plan.  There were no vitals filed for this visit.    Physical Exam: Constitutional: NAD Cardiovascular: RRR, no murmurs. MSK: Left elbow with normal range of motion, tenderness to palpation of the greater trochanteric region.  Assessment & Plan:   Patient seen with Dr. CHARLENA Leon  Assessment & Plan Greater trochanteric bursitis of left hip Trochanteric Bursa Injection Note  Pre-operative Diagnosis: left trochanteric bursitis  of the hip  Indications: Left hip pain  Anesthesia: Lidocaine  1% without epinephrine   Procedure Details  Consent was obtained for the procedure. The lateral hip was prepped with iodine. Using a 22 gauge needle the trochanteric bursa was injected with 2 mL 1% lidocaine  and 1 mL of triamcinolone  (KENALOG ) 40mg /ml. The needle was removed and a dressing was applied.  Complications:  None; patient tolerated the procedure well.   No orders of the defined types were placed in this encounter.   Rachel Sandhoff, MD [facility], PGY-2  Date 07/15/2024 Time 11:46 AM

## 2024-07-15 NOTE — Patient Instructions (Addendum)
 It was a pleasure taking care of you today!   You were given a steroid injection for the left hip pain.  Please continue to work with physical therapy just to make a pain better.     I have ordered the following labs for you:  Lab Orders  No laboratory test(s) ordered today      Follow up: 3 months   Should you have any questions or concerns please call the internal medicine clinic at [phone].     Missy Sandhoff, MD  [facility]

## 2024-07-17 NOTE — Assessment & Plan Note (Signed)
 Trochanteric Bursa Injection Note  Pre-operative Diagnosis: left trochanteric bursitis of the hip  Indications: Left hip pain  Anesthesia: Lidocaine  1% without epinephrine   Procedure Details  Consent was obtained for the procedure. The lateral hip was prepped with iodine. Using a 22 gauge needle the trochanteric bursa was injected with 2 mL 1% lidocaine  and 1 mL of triamcinolone  (KENALOG ) 40mg /ml. The needle was removed and a dressing was applied.  Complications:  None; patient tolerated the procedure well.

## 2024-07-21 ENCOUNTER — Other Ambulatory Visit (HOSPITAL_COMMUNITY): Payer: Self-pay

## 2024-07-22 ENCOUNTER — Other Ambulatory Visit: Payer: Self-pay

## 2024-07-29 ENCOUNTER — Encounter: Admitting: Gastroenterology

## 2024-07-29 ENCOUNTER — Telehealth: Payer: Self-pay | Admitting: *Deleted

## 2024-07-29 NOTE — Telephone Encounter (Signed)
 Was told by front desk staff that this pt called in stating she was not coming for procedure today. States she did not receive the prep. Did not see an active prescription for prep in chart. Should this pt be marked as a no show?

## 2024-08-11 ENCOUNTER — Encounter (HOSPITAL_COMMUNITY): Payer: Self-pay

## 2024-08-11 ENCOUNTER — Other Ambulatory Visit (HOSPITAL_COMMUNITY): Payer: Self-pay

## 2024-08-18 NOTE — Progress Notes (Signed)
 Internal Medicine Clinic Attending  I was physically present during the key portions of the resident provided service and participated in the medical decision making of patient's management care. I reviewed pertinent patient test results.  The assessment, diagnosis, and plan were formulated together and I agree with the documentation in the resident's note.  Rosan Dayton BROCKS, DO

## 2024-08-19 ENCOUNTER — Ambulatory Visit (AMBULATORY_SURGERY_CENTER)

## 2024-08-19 ENCOUNTER — Other Ambulatory Visit (HOSPITAL_COMMUNITY): Payer: Self-pay

## 2024-08-19 VITALS — Ht 62.0 in | Wt 225.0 lb

## 2024-08-19 DIAGNOSIS — K5792 Diverticulitis of intestine, part unspecified, without perforation or abscess without bleeding: Secondary | ICD-10-CM

## 2024-08-19 DIAGNOSIS — Z1211 Encounter for screening for malignant neoplasm of colon: Secondary | ICD-10-CM

## 2024-08-19 MED ORDER — SUTAB 1479-225-188 MG PO TABS
12.0000 | ORAL_TABLET | ORAL | 0 refills | Status: AC
  Filled 2024-08-19: qty 24, 2d supply, fill #0

## 2024-08-19 NOTE — Progress Notes (Signed)
 No egg or soy allergy known to patient  No issues known to pt with past sedation with any surgeries or procedures Patient denies ever being told they had issues or difficulty with intubation  No FH of Malignant Hyperthermia Pt is not on diet pills Pt is not on  home 02  Pt is not on blood thinners  Pt denies issues with constipation  No A fib or A flutter Have any cardiac testing pending-- no  LOA: independent  Prep: sutab    Patient's chart reviewed by Norleen Schillings CNRA prior to previsit and patient appropriate for the [facility].  Previsit completed and red dot placed by patient's name on their procedure day (on provider's schedule).     PV completed with patient. Prep instructions sent via mychart and home address.

## 2024-08-26 ENCOUNTER — Other Ambulatory Visit (HOSPITAL_COMMUNITY): Payer: Self-pay

## 2024-08-31 ENCOUNTER — Encounter: Payer: Self-pay | Admitting: Gastroenterology

## 2024-08-31 ENCOUNTER — Telehealth: Payer: Self-pay | Admitting: Gastroenterology

## 2024-08-31 NOTE — Telephone Encounter (Signed)
 Inbound call from pt requesting to discuss about her colonoscopy prep medication due to her being allergic to Sulfur medications. Patient is schedule for a colonoscopy on September the 16 th. Please advise.

## 2024-08-31 NOTE — Telephone Encounter (Signed)
 Inbound call from pt requesting to speak to anyone in regards to her prep medication per the information below. Patient is scheduled for September the 16 th. Please advise.

## 2024-08-31 NOTE — Telephone Encounter (Signed)
 Called patient back and she did not want to continue with suprep/ sutab  because she is allergic to sulfur drugs and explains how sick she was last time she tried to prep for a colonoscopy. Switched her prep to dulcolax and miralax  and sent new instructions via My Chart. I also advised her to give us  a call if she had any further issues with prepping or questions. She agreed.

## 2024-09-01 ENCOUNTER — Ambulatory Visit (AMBULATORY_SURGERY_CENTER): Admitting: Gastroenterology

## 2024-09-01 ENCOUNTER — Other Ambulatory Visit: Payer: Self-pay | Admitting: Gastroenterology

## 2024-09-01 ENCOUNTER — Encounter: Payer: Self-pay | Admitting: Gastroenterology

## 2024-09-01 ENCOUNTER — Other Ambulatory Visit (HOSPITAL_COMMUNITY): Payer: Self-pay

## 2024-09-01 VITALS — BP 142/82 | HR 71 | Temp 98.2°F | Resp 13 | Ht 62.0 in | Wt 225.0 lb

## 2024-09-01 DIAGNOSIS — K51418 Inflammatory polyps of colon with other complication: Secondary | ICD-10-CM

## 2024-09-01 DIAGNOSIS — T183XXA Foreign body in small intestine, initial encounter: Secondary | ICD-10-CM

## 2024-09-01 DIAGNOSIS — Z1211 Encounter for screening for malignant neoplasm of colon: Secondary | ICD-10-CM

## 2024-09-01 DIAGNOSIS — D122 Benign neoplasm of ascending colon: Secondary | ICD-10-CM | POA: Diagnosis not present

## 2024-09-01 DIAGNOSIS — T184XXA Foreign body in colon, initial encounter: Secondary | ICD-10-CM

## 2024-09-01 DIAGNOSIS — K644 Residual hemorrhoidal skin tags: Secondary | ICD-10-CM

## 2024-09-01 DIAGNOSIS — K5730 Diverticulosis of large intestine without perforation or abscess without bleeding: Secondary | ICD-10-CM

## 2024-09-01 DIAGNOSIS — D124 Benign neoplasm of descending colon: Secondary | ICD-10-CM | POA: Diagnosis not present

## 2024-09-01 DIAGNOSIS — D125 Benign neoplasm of sigmoid colon: Secondary | ICD-10-CM

## 2024-09-01 DIAGNOSIS — D123 Benign neoplasm of transverse colon: Secondary | ICD-10-CM

## 2024-09-01 DIAGNOSIS — K648 Other hemorrhoids: Secondary | ICD-10-CM

## 2024-09-01 MED ORDER — AMOXICILLIN-POT CLAVULANATE 875-125 MG PO TABS
1.0000 | ORAL_TABLET | Freq: Two times a day (BID) | ORAL | 0 refills | Status: AC
  Filled 2024-09-01: qty 14, 7d supply, fill #0

## 2024-09-01 MED ORDER — SODIUM CHLORIDE 0.9 % IV SOLN: 500.0000 mL | Freq: Once | INTRAVENOUS | Status: DC

## 2024-09-01 NOTE — Progress Notes (Unsigned)
 Pt's states no medical or surgical changes since previsit or office visit.

## 2024-09-01 NOTE — Op Note (Addendum)
 [facility] Patient Name: [name] Procedure Date: 09/01/2024 2:58 PM MRN: [id number] Endoscopist: Gustav ALONSO Mcgee , MD, [id number] Age: 58 Referring MD:  Date of Birth: Aug 08, 1966 Gender: Female Account #: [id number] Procedure:                Colonoscopy Indications:              Screening for colorectal malignant neoplasm Medicines:                Monitored Anesthesia Care Procedure:                Pre-Anesthesia Assessment:                           - Prior to the procedure, a History and Physical                            was performed, and patient medications and                            allergies were reviewed. The patient's tolerance of                            previous anesthesia was also reviewed. The risks                            and benefits of the procedure and the sedation                            options and risks were discussed with the patient.                            All questions were answered, and informed consent                            was obtained. Prior Anticoagulants: The patient has                            taken no anticoagulant or antiplatelet agents. ASA                            Grade Assessment: III - A patient with severe                            systemic disease. After reviewing the risks and                            benefits, the patient was deemed in satisfactory                            condition to undergo the procedure.                           After obtaining informed consent, the colonoscope  was passed under direct vision. Throughout the                            procedure, the patient's blood pressure, pulse, and                            oxygen saturations were monitored continuously. The                            Olympus Scope SN [id number] was introduced through the                            anus and advanced to the the cecum, identified by                             appendiceal orifice and ileocecal valve. The                            colonoscopy was performed without difficulty. The                            patient tolerated the procedure well. The quality                            of the bowel preparation was adequate. The                            ileocecal valve, appendiceal orifice, and rectum                            were photographed. Scope In: 3:32:05 PM Scope Out: 3:57:00 PM Scope Withdrawal Time: 0 hours 18 minutes 8 seconds  Total Procedure Duration: 0 hours 24 minutes 55 seconds  Findings:                 The perianal and digital rectal examinations were                            normal.                           Three sessile polyps were found in the descending                            colon, transverse colon and ascending colon. The                            polyps were 9 to 11 mm in size. These polyps were                            removed with a cold snare. Resection and retrieval                            were complete.  A 10 mm polyp was found in the sigmoid colon. The                            polyp was semi-pedunculated. The polyp was removed                            with a hot snare. Resection and retrieval were                            complete.                           A 20 mm polypoid lesion was found in the sigmoid                            colon. The lesion was multi-lobulated. No bleeding                            was present. This was biopsied with a cold forceps                            for histology.                           A foreign body was found in the sigmoid colon.                            Removal of 8-10cm long hard foreign body was                            accomplished with a snare.                           Multiple large-mouthed, medium-mouthed and                            small-mouthed diverticula were found in the sigmoid                             colon and descending colon.                           Non-bleeding external and internal hemorrhoids were                            found during retroflexion. The hemorrhoids were                            medium-sized. Complications:            No immediate complications. Estimated Blood Loss:     Estimated blood loss: none. Impression:               - Three 9 to 11 mm polyps in the descending colon,  in the transverse colon and in the ascending colon,                            removed with a cold snare. Resected and retrieved.                           - One 10 mm polyp in the sigmoid colon, removed                            with a hot snare. Resected and retrieved.                           - Likely benign polypoid lesion in the sigmoid                            colon. Biopsied.                           - Foreign body in the sigmoid colon. Removal was                            successful.                           - Severe diverticulosis in the sigmoid colon and in                            the descending colon.                           - Non-bleeding external and internal hemorrhoids. Recommendation:           - Resume previous diet.                           - Continue present medications.                           - Await pathology results.                           - Repeat colonoscopy date to be determined after                            pending pathology results are reviewed for                            surveillance based on pathology results.                           - Augmentin  875mg  BID X 7 days Gustav ALONSO Mcgee, MD 09/01/2024 4:07:13 PM This report has been signed electronically.

## 2024-09-01 NOTE — Progress Notes (Unsigned)
 To pacu, VSS. Report to [email or web address]

## 2024-09-01 NOTE — Patient Instructions (Addendum)
 Resume previous diet. Continue present medications Take Augmentin  875 mg twice daily for 7 days Await pathology results Repeat colonoscopy date to be determined after pending pathology results are reviewed for surveillance based on pathology results    YOU HAD AN ENDOSCOPIC PROCEDURE TODAY AT THE [facility]:   Refer to the procedure report that was given to you for any specific questions about what was found during the examination.  If the procedure report does not answer your questions, please call your gastroenterologist to clarify.  If you requested that your care partner not be given the details of your procedure findings, then the procedure report has been included in a sealed envelope for you to review at your convenience later.  YOU SHOULD EXPECT: Some feelings of bloating in the abdomen. Passage of more gas than usual.  Walking can help get rid of the air that was put into your GI tract during the procedure and reduce the bloating. If you had a lower endoscopy (such as a colonoscopy or flexible sigmoidoscopy) you may notice spotting of blood in your stool or on the toilet paper. If you underwent a bowel prep for your procedure, you may not have a normal bowel movement for a few days.  Please Note:  You might notice some irritation and congestion in your nose or some drainage.  This is from the oxygen used during your procedure.  There is no need for concern and it should clear up in a day or so.  SYMPTOMS TO REPORT IMMEDIATELY:  Following lower endoscopy (colonoscopy or flexible sigmoidoscopy):  Excessive amounts of blood in the stool  Significant tenderness or worsening of abdominal pains  Swelling of the abdomen that is new, acute  Fever of 100F or higher   For urgent or emergent issues, a gastroenterologist can be reached at any hour by calling [phone]. Do not use MyChart messaging for urgent concerns.    DIET:  We do recommend a small meal at first, but  then you may proceed to your regular diet.  Drink plenty of fluids but you should avoid alcoholic beverages for 24 hours.  ACTIVITY:  You should plan to take it easy for the rest of today and you should NOT DRIVE or use heavy machinery until tomorrow (because of the sedation medicines used during the test).    FOLLOW UP: Our staff will call the number listed on your records the next business day following your procedure.  We will call around 7:15- 8:00 am to check on you and address any questions or concerns that you may have regarding the information given to you following your procedure. If we do not reach you, we will leave a message.     If any biopsies were taken you will be contacted by phone or by letter within the next 1-3 weeks.  Please call us  at [phone] if you have not heard about the biopsies in 3 weeks.    SIGNATURES/CONFIDENTIALITY: You and/or your care partner have signed paperwork which will be entered into your electronic medical record.  These signatures attest to the fact that that the information above on your After Visit Summary has been reviewed and is understood.  Full responsibility of the confidentiality of this discharge information lies with you and/or your care-partner.

## 2024-09-01 NOTE — Progress Notes (Unsigned)
 [facility] History and Physical   Primary Care Physician:  Amilibia, Jaden, DO   Reason for Procedure:  Colorectal cancer screening  Plan:    Screening colonoscopy with possible interventions as needed     HPI: [name] is a very pleasant 58 y.o. female here for screening colonoscopy. Denies any nausea, vomiting, abdominal pain, melena or bright red blood per rectum  The risks and benefits as well as alternatives of endoscopic procedure(s) have been discussed and reviewed. All questions answered. The patient agrees to proceed.    Past Medical History:  Diagnosis Date   COPD (chronic obstructive pulmonary disease) (HCC) 01/19/2014   GERD (gastroesophageal reflux disease)    Hypertension     Past Surgical History:  Procedure Laterality Date   COLONOSCOPY  11/19/2012   Procedure: COLONOSCOPY;  Surgeon: Lynwood LITTIE Celestia Mickey., MD;  Location: [facility] ENDOSCOPY;  Service: Endoscopy;  Laterality: N/A;   TUBAL LIGATION      Prior to Admission medications   Medication Sig Start Date End Date Taking? Authorizing Provider  acetaminophen  (TYLENOL ) 500 MG tablet Take 2 tablets (1,000 mg total) by mouth every 6 (six) hours. Patient taking differently: Take 1,000 mg by mouth every 6 (six) hours as needed. 04/27/24  Yes Gregary Sharper, MD  amLODipine  (NORVASC ) 10 MG tablet Take 1 tablet (10 mg total) by mouth daily. 04/20/24 04/20/25 Yes Celestina Czar, MD  loratadine  (CLARITIN ) 10 MG tablet Take 10 mg by mouth daily.   Yes [provider]  omeprazole (PRILOSEC OTC) 20 MG tablet Take 20 mg by mouth daily.   Yes [provider]  rosuvastatin  (CRESTOR ) 20 MG tablet Take 1 tablet (20 mg total) by mouth daily. 05/07/24  Yes Gregary Sharper, MD  Sodium Sulfate-Mag Sulfate-KCl (SUTAB ) [phone] MG TABS Take 12 tablets by mouth as directed. 08/19/24  Yes Vearl Aitken V, MD    Current Outpatient Medications  Medication Sig Dispense Refill   acetaminophen  (TYLENOL )  500 MG tablet Take 2 tablets (1,000 mg total) by mouth every 6 (six) hours. (Patient taking differently: Take 1,000 mg by mouth every 6 (six) hours as needed.) 30 tablet 0   amLODipine  (NORVASC ) 10 MG tablet Take 1 tablet (10 mg total) by mouth daily. 30 tablet 11   loratadine  (CLARITIN ) 10 MG tablet Take 10 mg by mouth daily.     omeprazole (PRILOSEC OTC) 20 MG tablet Take 20 mg by mouth daily.     rosuvastatin  (CRESTOR ) 20 MG tablet Take 1 tablet (20 mg total) by mouth daily. 30 tablet 11   Sodium Sulfate-Mag Sulfate-KCl (SUTAB ) 1479-225-188 MG TABS Take 12 tablets by mouth as directed. 24 tablet 0   Current Facility-Administered Medications  Medication Dose Route Frequency Provider Last Rate Last Admin   0.9 %  sodium chloride  infusion  500 mL Intravenous Once Shandy Vi V, MD        Allergies as of 09/01/2024 - Review Complete 09/01/2024  Allergen Reaction Noted   Sulfa drugs cross reactors Nausea And Vomiting 12/21/2011    Family History  Problem Relation Age of Onset   Hypertension Mother    Stroke Mother    Hypertension Father    Heart disease Father    COPD Sister    Heart disease Brother    Hypertension Brother    Breast cancer Maternal Aunt 45   Cancer Maternal Aunt    Breast cancer Paternal Aunt    Cancer Paternal Aunt    Cancer Maternal Grandmother    Heart  disease Maternal Grandfather    Hypertension Maternal Grandfather    Heart disease Paternal Grandmother    Diabetes Paternal Grandmother    Heart disease Paternal Grandfather    Hypertension Paternal Grandfather    Colon cancer Neg Hx    Rectal cancer Neg Hx    Stomach cancer Neg Hx     Social History   Socioeconomic History   Marital status: Married    Spouse name: Not on file   Number of children: 5   Years of education: 10th grade   Highest education level: Not on file  Occupational History   Occupation: unemployed  Tobacco Use   Smoking status: Former    Current packs/day: 0.00     Average packs/day: 1 pack/day for 25.0 years (25.0 ttl pk-yrs)    Types: Cigarettes    Start date: 01/04/1983    Quit date: 01/05/2008    Years since quitting: 16.6   Smokeless tobacco: Never  Vaping Use   Vaping status: Never Used  Substance and Sexual Activity   Alcohol use: No    Alcohol/week: 0.0 standard drinks of alcohol   Drug use: No   Sexual activity: Not on file  Other Topics Concern   Not on file  Social History Narrative   Lives in [place] with husband and extended family.   Social Drivers of Corporate investment banker Strain: Not on file  Food Insecurity: No Food Insecurity (02/15/2024)   Hunger Vital Sign    Worried About Running Out of Food in the Last Year: Never true    Ran Out of Food in the Last Year: Never true  Transportation Needs: No Transportation Needs (02/15/2024)   PRAPARE - Administrator, Civil Service (Medical): No    Lack of Transportation (Non-Medical): No  Physical Activity: Not on file  Stress: Not on file  Social Connections: Not on file  Intimate Partner Violence: Not At Risk (02/15/2024)   Humiliation, Afraid, Rape, and Kick questionnaire    Fear of Current or Ex-Partner: No    Emotionally Abused: No    Physically Abused: No    Sexually Abused: No    Review of Systems:  All other review of systems negative except as mentioned in the HPI.  Physical Exam: Vital signs in last 24 hours: BP (!) 148/93   Pulse 89   Temp 98.2 F (36.8 C) (Temporal)   Ht 5' 2 (1.575 m)   Wt 225 lb (102.1 kg)   SpO2 96%   BMI 41.15 kg/m  General:   Alert, NAD Lungs:  Clear .   Heart:  Regular rate and rhythm Abdomen:  Soft, nontender and nondistended. Neuro/Psych:  Alert and cooperative. Normal mood and affect. A and O x 3  Reviewed labs, radiology imaging, old records and pertinent past GI work up  Patient is appropriate for planned procedure(s) and anesthesia in an ambulatory setting   K. Veena Fontaine Kossman , MD [phone]

## 2024-09-02 ENCOUNTER — Telehealth: Payer: Self-pay

## 2024-09-02 NOTE — Telephone Encounter (Signed)
  Follow up Call-     09/01/2024    2:33 PM  Call back number  Post procedure Call Back phone  # [phone]  Permission to leave phone message Yes     Patient questions:  Do you have a fever, pain , or abdominal swelling? No. Pain Score  0 *  Have you tolerated food without any problems? Yes.    Have you been able to return to your normal activities? Yes.    Do you have any questions about your discharge instructions: Diet   No. Medications  No. Follow up visit  No.  Do you have questions or concerns about your Care? No.  Actions: * If pain score is 4 or above: No action needed, pain <4.

## 2024-09-04 LAB — SURGICAL PATHOLOGY

## 2024-09-14 ENCOUNTER — Other Ambulatory Visit (HOSPITAL_COMMUNITY): Payer: Self-pay

## 2024-10-12 ENCOUNTER — Encounter (HOSPITAL_COMMUNITY): Payer: Self-pay

## 2024-10-12 ENCOUNTER — Other Ambulatory Visit (HOSPITAL_COMMUNITY): Payer: Self-pay

## 2024-10-12 ENCOUNTER — Other Ambulatory Visit: Payer: Self-pay | Admitting: Gastroenterology

## 2024-10-12 DIAGNOSIS — Z1211 Encounter for screening for malignant neoplasm of colon: Secondary | ICD-10-CM

## 2024-10-12 DIAGNOSIS — K5792 Diverticulitis of intestine, part unspecified, without perforation or abscess without bleeding: Secondary | ICD-10-CM

## 2024-10-14 ENCOUNTER — Ambulatory Visit: Payer: Self-pay | Admitting: Gastroenterology

## 2024-10-30 ENCOUNTER — Other Ambulatory Visit (HOSPITAL_COMMUNITY): Payer: Self-pay

## 2024-10-30 ENCOUNTER — Ambulatory Visit: Payer: Self-pay

## 2024-10-30 VITALS — BP 144/84 | HR 79 | Temp 97.6°F | Ht 62.0 in | Wt 239.0 lb

## 2024-10-30 DIAGNOSIS — J3489 Other specified disorders of nose and nasal sinuses: Secondary | ICD-10-CM

## 2024-10-30 DIAGNOSIS — I10 Essential (primary) hypertension: Secondary | ICD-10-CM | POA: Diagnosis not present

## 2024-10-30 MED ORDER — VALSARTAN 40 MG PO TABS
40.0000 mg | ORAL_TABLET | Freq: Every day | ORAL | 11 refills | Status: AC
  Filled 2024-10-30: qty 30, 30d supply, fill #0
  Filled 2024-11-16 – 2024-11-23 (×2): qty 30, 30d supply, fill #1
  Filled 2024-12-12 – 2024-12-15 (×2): qty 30, 30d supply, fill #2
  Filled 2025-01-15: qty 30, 30d supply, fill #3

## 2024-10-30 NOTE — Assessment & Plan Note (Signed)
 Presents with sinus pressure and facial congestion ongoing for 2 weeks (coming and going). Endorses: ears draining clear fluid (most noticeable at night), sinus HA, sneezing, seasonal allergies, facial swelling. Denies: chest congestions, rhinorrhea, cough, chest congestion, fevers, chills, body aches, sore throat. Has been taking her daily claritin  (which she has been taking prior to this presentation), benadryl  at night (sparingly), and Nasacort  nasal spray every day for the past 3-4 days. Patient stated that she has tried to get rid of all other allergens within the home. Likely sinus congestion, unlikely infectious given ROS. Instructing the following:  - nasal lavage  - humidifier  - daily nasal spray  - keep taking daily claritin   - you can use afrin SPARINGLY  - aquafor around edges of nose

## 2024-10-30 NOTE — Progress Notes (Signed)
 CC: Possible sinus infection  HPI:  [name] is a 58 y.o. female with further medical history stated below) and presents today for possible sinus infection. Please see problem based assessment and plan for additional details.  Last clinic appointment: 07/15/2024: Trochanteric bursa injection  Last Pertinent Labs Documented:     Latest Ref Rng & Units 02/20/2024    1:38 PM 02/17/2024    6:46 AM 02/16/2024    6:57 AM  BMP  Glucose 70 - 99 mg/dL 99  893  887   BUN 6 - 24 mg/dL 11  6  8    Creatinine 0.57 - 1.00 mg/dL 9.16  9.15  9.18   BUN/Creat Ratio 9 - 23 13     Sodium 134 - 144 mmol/L 146  139  140   Potassium 3.5 - 5.2 mmol/L 4.1  3.9  4.1   Chloride 96 - 106 mmol/L 105  108  104   CO2 20 - 29 mmol/L 21  23  24    Calcium  8.7 - 10.2 mg/dL 9.4  8.7  8.8        Latest Ref Rng & Units 04/20/2024    3:53 PM 02/17/2024    6:46 AM 02/16/2024    6:57 AM  CBC  WBC 3.4 - 10.8 x10E3/uL 5.6  4.6  5.5   Hemoglobin 11.1 - 15.9 g/dL 86.4  87.5  88.6   Hematocrit 34.0 - 46.6 % 41.0  37.2  34.5   Platelets 150 - 450 x10E3/uL 245  240  225     Lab Results  Component Value Date   HGBA1C 5.8 (A) 04/04/2023   HGBA1C 5.6 05/24/2022   HGBA1C 5.9 (A) 03/27/2021     Past Medical History:  Diagnosis Date   COPD (chronic obstructive pulmonary disease) (HCC) 01/19/2014   GERD (gastroesophageal reflux disease)    Hypertension     Current Outpatient Medications on File Prior to Visit  Medication Sig Dispense Refill   acetaminophen  (TYLENOL ) 500 MG tablet Take 2 tablets (1,000 mg total) by mouth every 6 (six) hours. (Patient taking differently: Take 1,000 mg by mouth every 6 (six) hours as needed.) 30 tablet 0   amLODipine  (NORVASC ) 10 MG tablet Take 1 tablet (10 mg total) by mouth daily. 30 tablet 11   loratadine  (CLARITIN ) 10 MG tablet Take 10 mg by mouth daily.     omeprazole (PRILOSEC OTC) 20 MG tablet Take 20 mg by mouth daily.     rosuvastatin  (CRESTOR ) 20 MG tablet Take 1 tablet (20  mg total) by mouth daily. 30 tablet 11   Sodium Sulfate-Mag Sulfate-KCl (SUTAB ) 1479-225-188 MG TABS Take 12 tablets by mouth as directed. 24 tablet 0   No current facility-administered medications on file prior to visit.    Family History  Problem Relation Age of Onset   Hypertension Mother    Stroke Mother    Hypertension Father    Heart disease Father    COPD Sister    Heart disease Brother    Hypertension Brother    Breast cancer Maternal Aunt 93   Cancer Maternal Aunt    Breast cancer Paternal Aunt    Cancer Paternal Aunt    Cancer Maternal Grandmother    Heart disease Maternal Grandfather    Hypertension Maternal Grandfather    Heart disease Paternal Grandmother    Diabetes Paternal Grandmother    Heart disease Paternal Grandfather    Hypertension Paternal Grandfather    Colon cancer Neg Hx  Rectal cancer Neg Hx    Stomach cancer Neg Hx     Social History   Socioeconomic History   Marital status: Married    Spouse name: Not on file   Number of children: 5   Years of education: 10th grade   Highest education level: Not on file  Occupational History   Occupation: unemployed  Tobacco Use   Smoking status: Former    Current packs/day: 0.00    Average packs/day: 1 pack/day for 25.0 years (25.0 ttl pk-yrs)    Types: Cigarettes    Start date: 01/04/1983    Quit date: 01/05/2008    Years since quitting: 16.8   Smokeless tobacco: Never  Vaping Use   Vaping status: Never Used  Substance and Sexual Activity   Alcohol use: No    Alcohol/week: 0.0 standard drinks of alcohol   Drug use: No   Sexual activity: Not on file  Other Topics Concern   Not on file  Social History Narrative   Lives in [place] with husband and extended family.   Social Drivers of Corporate Investment Banker Strain: Not on file  Food Insecurity: No Food Insecurity (02/15/2024)   Hunger Vital Sign    Worried About Running Out of Food in the Last Year: Never true    Ran Out of Food in  the Last Year: Never true  Transportation Needs: No Transportation Needs (02/15/2024)   PRAPARE - Administrator, Civil Service (Medical): No    Lack of Transportation (Non-Medical): No  Physical Activity: Not on file  Stress: Not on file  Social Connections: Not on file  Intimate Partner Violence: Not At Risk (02/15/2024)   Humiliation, Afraid, Rape, and Kick questionnaire    Fear of Current or Ex-Partner: No    Emotionally Abused: No    Physically Abused: No    Sexually Abused: No    Review of Systems: Review of Systems  Constitutional:  Negative for chills and fever.  HENT:  Positive for congestion, ear discharge and sinus pain. Negative for sore throat.   Respiratory:  Negative for cough and sputum production.   Cardiovascular:  Negative for chest pain.  Gastrointestinal:  Negative for constipation, diarrhea, nausea and vomiting.  Musculoskeletal:  Negative for myalgias.  Neurological:  Positive for headaches.   And as stated below  Vitals:   10/30/24 1005  BP: (!) 144/84  Pulse: 79  Temp: 97.6 F (36.4 C)  TempSrc: Oral  SpO2: 98%  Weight: 239 lb (108.4 kg)  Height: 5' 2 (1.575 m)    Physical Exam: Physical Exam Constitutional:      General: She is not in acute distress.    Appearance: She is not ill-appearing, toxic-appearing or diaphoretic.  HENT:     Right Ear: Tympanic membrane normal. There is no impacted cerumen.     Left Ear: There is impacted cerumen.     Nose: No rhinorrhea.     Right Turbinates: Pale.     Left Turbinates: Pale.     Right Sinus: Maxillary sinus tenderness and frontal sinus tenderness present.     Left Sinus: Maxillary sinus tenderness and frontal sinus tenderness present.     Mouth/Throat:     Mouth: Mucous membranes are moist.  Cardiovascular:     Rate and Rhythm: Normal rate and regular rhythm.     Heart sounds: Normal heart sounds.  Pulmonary:     Effort: Pulmonary effort is normal.     Breath sounds: Normal  breath  sounds.  Neurological:     Mental Status: She is alert.      Assessment & Plan:   Patient seen with Dr. Shawn  Assessment & Plan Sinus pressure Presents with sinus pressure and facial congestion ongoing for 2 weeks (coming and going). Endorses: ears draining clear fluid (most noticeable at night), sinus HA, sneezing, seasonal allergies, facial swelling. Denies: chest congestions, rhinorrhea, cough, chest congestion, fevers, chills, body aches, sore throat. Has been taking her daily claritin  (which she has been taking prior to this presentation), benadryl  at night (sparingly), and Nasacort  nasal spray every day for the past 3-4 days. Patient stated that she has tried to get rid of all other allergens within the home. Likely sinus congestion, unlikely infectious given ROS. Instructing the following:  - nasal lavage  - humidifier  - daily nasal spray  - keep taking daily claritin   - you can use afrin SPARINGLY  - aquafor around edges of nose  Essential hypertension Vitals:   10/30/24 1005  BP: (!) 144/84  Presented hypertensive. Current medication management includes amlodipine  10 mg. Was previously on HTN therapy with olmesartan -hydrochlorothiazide , but during hospitalization 02/2024 was normotensive so was held and never restarted. Has been hypertensive at last couple of office visits. Will restart ARB management. Patient agreeable.  - valsartan 40 mg daily ordered  - Recommending f/u in 1 month for healthcare maintenance   No orders of the defined types were placed in this encounter.    Sallyanne Primas, D.O. [facility] Internal Medicine, PGY-1 Date 10/30/2024 Time 2:46 PM

## 2024-10-30 NOTE — Patient Instructions (Addendum)
 Today we discussed the following medical conditions and plan:   BLOOD PRESSURE  - started you on valsartan 40 mg daily (take this in addition to your amlodipine   - come in the next couple of months for a full physical   NASAL CONGESTION  - nasal lavage  - humidifier  - daily nasal spray  - keep taking daily claritin   - you can use afrin SPARINGLY  - aquafor around edges of nose   We look forward to seeing you next time. Please call our clinic at [phone] if you have any questions or concerns. The best time to call is Monday-Friday from 9am-4pm, but there is someone available 24/7. If you need medication refills, please notify your pharmacy one week in advance and they will send us  a request.   Thank you for trusting me with your care. Wishing you the best!   Sallyanne Primas, DO  [facility]

## 2024-10-30 NOTE — Assessment & Plan Note (Addendum)
 Vitals:   10/30/24 1005  BP: (!) 144/84  Presented hypertensive. Current medication management includes amlodipine  10 mg. Was previously on HTN therapy with olmesartan -hydrochlorothiazide , but during hospitalization 02/2024 was normotensive so was held and never restarted. Has been hypertensive at last couple of office visits. Will restart ARB management. Patient agreeable.  - valsartan 40 mg daily ordered  - Recommending f/u in 1 month for healthcare maintenance

## 2024-11-02 NOTE — Addendum Note (Signed)
 Addended by: SHAWN SICK on: 11/02/2024 10:03 AM   Modules accepted: Level of Service

## 2024-11-16 ENCOUNTER — Other Ambulatory Visit: Payer: Self-pay

## 2024-11-16 ENCOUNTER — Other Ambulatory Visit (HOSPITAL_COMMUNITY): Payer: Self-pay

## 2024-11-17 ENCOUNTER — Other Ambulatory Visit (HOSPITAL_COMMUNITY): Payer: Self-pay

## 2024-11-18 NOTE — Progress Notes (Signed)
 Internal Medicine Clinic Attending  I was physically present during the key portions of the resident provided service and participated in the medical decision making of patient's management care. I reviewed pertinent patient test results.  The assessment, diagnosis, and plan were formulated together and I agree with the documentation in the resident's note.  Shawn Sick, MD

## 2024-12-12 ENCOUNTER — Other Ambulatory Visit (HOSPITAL_COMMUNITY): Payer: Self-pay

## 2024-12-14 ENCOUNTER — Other Ambulatory Visit: Payer: Self-pay
